# Patient Record
Sex: Female | Born: 1940 | Race: White | Hispanic: No | Marital: Married | Smoking: Never smoker
Health system: Southern US, Community
[De-identification: ages and names within clinical notes are randomized; demographics above are authoritative.]

## PROBLEM LIST (undated history)

## (undated) DIAGNOSIS — IMO0002 Reserved for concepts with insufficient information to code with codable children: Secondary | ICD-10-CM

## (undated) DIAGNOSIS — E119 Type 2 diabetes mellitus without complications: Secondary | ICD-10-CM

## (undated) DIAGNOSIS — J45909 Unspecified asthma, uncomplicated: Secondary | ICD-10-CM

## (undated) DIAGNOSIS — K219 Gastro-esophageal reflux disease without esophagitis: Secondary | ICD-10-CM

## (undated) DIAGNOSIS — I10 Essential (primary) hypertension: Secondary | ICD-10-CM

## (undated) DIAGNOSIS — IMO0001 Reserved for inherently not codable concepts without codable children: Secondary | ICD-10-CM

## (undated) DIAGNOSIS — C801 Malignant (primary) neoplasm, unspecified: Secondary | ICD-10-CM

## (undated) DIAGNOSIS — C50919 Malignant neoplasm of unspecified site of unspecified female breast: Secondary | ICD-10-CM

## (undated) HISTORY — PX: NASAL FRACTURE SURGERY: SHX718

## (undated) HISTORY — DX: Type 2 diabetes mellitus without complications: E11.9

## (undated) HISTORY — PX: BREAST LUMPECTOMY: SHX2

## (undated) HISTORY — DX: Reserved for concepts with insufficient information to code with codable children: IMO0002

## (undated) HISTORY — DX: Reserved for inherently not codable concepts without codable children: IMO0001

---

## 1981-11-14 HISTORY — PX: ABDOMINAL HYSTERECTOMY: SHX81

## 1998-08-28 ENCOUNTER — Ambulatory Visit: Admission: RE | Admit: 1998-08-28 | Discharge: 1998-08-28 | Payer: Self-pay | Admitting: Geriatric Medicine

## 1999-11-03 ENCOUNTER — Encounter: Admission: RE | Admit: 1999-11-03 | Discharge: 1999-11-03 | Payer: Self-pay | Admitting: Geriatric Medicine

## 1999-11-03 ENCOUNTER — Encounter: Payer: Self-pay | Admitting: Geriatric Medicine

## 2000-11-03 ENCOUNTER — Encounter: Admission: RE | Admit: 2000-11-03 | Discharge: 2000-11-03 | Payer: Self-pay | Admitting: Geriatric Medicine

## 2000-11-03 ENCOUNTER — Encounter: Payer: Self-pay | Admitting: Geriatric Medicine

## 2000-11-15 ENCOUNTER — Encounter: Payer: Self-pay | Admitting: Geriatric Medicine

## 2000-11-15 ENCOUNTER — Encounter: Admission: RE | Admit: 2000-11-15 | Discharge: 2000-11-15 | Payer: Self-pay | Admitting: Geriatric Medicine

## 2001-05-04 ENCOUNTER — Encounter: Admission: RE | Admit: 2001-05-04 | Discharge: 2001-05-04 | Payer: Self-pay | Admitting: Geriatric Medicine

## 2001-05-04 ENCOUNTER — Encounter: Payer: Self-pay | Admitting: Geriatric Medicine

## 2001-11-09 ENCOUNTER — Encounter: Payer: Self-pay | Admitting: Geriatric Medicine

## 2001-11-09 ENCOUNTER — Ambulatory Visit (HOSPITAL_COMMUNITY): Admission: RE | Admit: 2001-11-09 | Discharge: 2001-11-09 | Payer: Self-pay | Admitting: Geriatric Medicine

## 2002-11-01 ENCOUNTER — Encounter: Payer: Self-pay | Admitting: Geriatric Medicine

## 2002-11-01 ENCOUNTER — Ambulatory Visit (HOSPITAL_COMMUNITY): Admission: RE | Admit: 2002-11-01 | Discharge: 2002-11-01 | Payer: Self-pay | Admitting: Geriatric Medicine

## 2002-11-05 ENCOUNTER — Encounter: Admission: RE | Admit: 2002-11-05 | Discharge: 2002-11-05 | Payer: Self-pay | Admitting: Geriatric Medicine

## 2002-11-05 ENCOUNTER — Encounter (INDEPENDENT_AMBULATORY_CARE_PROVIDER_SITE_OTHER): Payer: Self-pay | Admitting: Specialist

## 2002-11-05 ENCOUNTER — Other Ambulatory Visit: Admission: RE | Admit: 2002-11-05 | Discharge: 2002-11-05 | Payer: Self-pay | Admitting: Radiology

## 2002-11-05 ENCOUNTER — Encounter: Payer: Self-pay | Admitting: Geriatric Medicine

## 2003-11-13 ENCOUNTER — Ambulatory Visit (HOSPITAL_COMMUNITY): Admission: RE | Admit: 2003-11-13 | Discharge: 2003-11-13 | Payer: Self-pay | Admitting: Geriatric Medicine

## 2004-11-18 ENCOUNTER — Emergency Department (HOSPITAL_COMMUNITY): Admission: EM | Admit: 2004-11-18 | Discharge: 2004-11-18 | Payer: Self-pay | Admitting: Emergency Medicine

## 2004-11-25 ENCOUNTER — Ambulatory Visit (HOSPITAL_COMMUNITY): Admission: RE | Admit: 2004-11-25 | Discharge: 2004-11-25 | Payer: Self-pay | Admitting: Geriatric Medicine

## 2004-12-20 ENCOUNTER — Encounter: Admission: RE | Admit: 2004-12-20 | Discharge: 2004-12-20 | Payer: Self-pay | Admitting: Geriatric Medicine

## 2004-12-23 ENCOUNTER — Encounter: Admission: RE | Admit: 2004-12-23 | Discharge: 2004-12-23 | Payer: Self-pay | Admitting: Geriatric Medicine

## 2005-12-21 ENCOUNTER — Encounter: Admission: RE | Admit: 2005-12-21 | Discharge: 2005-12-21 | Payer: Self-pay | Admitting: Geriatric Medicine

## 2006-03-15 ENCOUNTER — Encounter: Payer: Self-pay | Admitting: Geriatric Medicine

## 2006-12-25 ENCOUNTER — Ambulatory Visit (HOSPITAL_COMMUNITY): Admission: RE | Admit: 2006-12-25 | Discharge: 2006-12-25 | Payer: Self-pay | Admitting: Geriatric Medicine

## 2007-01-15 ENCOUNTER — Encounter: Admission: RE | Admit: 2007-01-15 | Discharge: 2007-01-15 | Payer: Self-pay | Admitting: Geriatric Medicine

## 2008-01-16 ENCOUNTER — Ambulatory Visit (HOSPITAL_COMMUNITY): Admission: RE | Admit: 2008-01-16 | Discharge: 2008-01-16 | Payer: Self-pay | Admitting: Geriatric Medicine

## 2009-01-20 ENCOUNTER — Encounter: Admission: RE | Admit: 2009-01-20 | Discharge: 2009-01-20 | Payer: Self-pay | Admitting: Geriatric Medicine

## 2009-04-19 ENCOUNTER — Emergency Department (HOSPITAL_COMMUNITY): Admission: EM | Admit: 2009-04-19 | Discharge: 2009-04-19 | Payer: Self-pay | Admitting: Emergency Medicine

## 2010-02-01 ENCOUNTER — Encounter: Admission: RE | Admit: 2010-02-01 | Discharge: 2010-02-01 | Payer: Self-pay | Admitting: Geriatric Medicine

## 2010-10-22 ENCOUNTER — Inpatient Hospital Stay: Admission: RE | Admit: 2010-10-22 | Payer: Self-pay | Source: Home / Self Care | Admitting: Orthopedic Surgery

## 2010-12-05 ENCOUNTER — Encounter: Payer: Self-pay | Admitting: Geriatric Medicine

## 2011-01-31 ENCOUNTER — Other Ambulatory Visit: Payer: Self-pay | Admitting: Geriatric Medicine

## 2011-01-31 DIAGNOSIS — Z1231 Encounter for screening mammogram for malignant neoplasm of breast: Secondary | ICD-10-CM

## 2011-03-21 ENCOUNTER — Ambulatory Visit
Admission: RE | Admit: 2011-03-21 | Discharge: 2011-03-21 | Disposition: A | Discharge status: Home / Self Care | Payer: Medicare Other | Source: Ambulatory Visit | Attending: Geriatric Medicine | Admitting: Geriatric Medicine

## 2011-03-21 DIAGNOSIS — Z1231 Encounter for screening mammogram for malignant neoplasm of breast: Secondary | ICD-10-CM

## 2011-03-22 ENCOUNTER — Other Ambulatory Visit: Payer: Self-pay | Admitting: Geriatric Medicine

## 2011-03-22 DIAGNOSIS — R928 Other abnormal and inconclusive findings on diagnostic imaging of breast: Secondary | ICD-10-CM

## 2011-03-29 ENCOUNTER — Ambulatory Visit
Admission: RE | Admit: 2011-03-29 | Discharge: 2011-03-29 | Disposition: A | Discharge status: Home / Self Care | Payer: Medicare Other | Source: Ambulatory Visit | Attending: Geriatric Medicine | Admitting: Geriatric Medicine

## 2011-03-29 DIAGNOSIS — R928 Other abnormal and inconclusive findings on diagnostic imaging of breast: Secondary | ICD-10-CM

## 2011-08-24 DIAGNOSIS — J449 Chronic obstructive pulmonary disease, unspecified: Secondary | ICD-10-CM | POA: Insufficient documentation

## 2012-03-12 DIAGNOSIS — R05 Cough: Secondary | ICD-10-CM | POA: Diagnosis not present

## 2012-03-12 DIAGNOSIS — Z0000 Encounter for general adult medical examination without abnormal findings: Secondary | ICD-10-CM | POA: Diagnosis not present

## 2012-03-12 DIAGNOSIS — Z79899 Other long term (current) drug therapy: Secondary | ICD-10-CM | POA: Diagnosis not present

## 2012-03-12 DIAGNOSIS — Z1331 Encounter for screening for depression: Secondary | ICD-10-CM | POA: Diagnosis not present

## 2012-03-12 DIAGNOSIS — E7800 Pure hypercholesterolemia, unspecified: Secondary | ICD-10-CM | POA: Diagnosis not present

## 2012-03-12 DIAGNOSIS — I10 Essential (primary) hypertension: Secondary | ICD-10-CM | POA: Diagnosis not present

## 2012-04-05 DIAGNOSIS — J189 Pneumonia, unspecified organism: Secondary | ICD-10-CM | POA: Diagnosis not present

## 2012-04-16 DIAGNOSIS — J189 Pneumonia, unspecified organism: Secondary | ICD-10-CM | POA: Diagnosis not present

## 2012-05-09 ENCOUNTER — Other Ambulatory Visit: Payer: Self-pay | Admitting: Geriatric Medicine

## 2012-05-09 DIAGNOSIS — Z1231 Encounter for screening mammogram for malignant neoplasm of breast: Secondary | ICD-10-CM

## 2012-05-18 ENCOUNTER — Ambulatory Visit
Admission: RE | Admit: 2012-05-18 | Discharge: 2012-05-18 | Disposition: A | Discharge status: Home / Self Care | Payer: Medicare Other | Source: Ambulatory Visit | Attending: Geriatric Medicine | Admitting: Geriatric Medicine

## 2012-05-18 ENCOUNTER — Other Ambulatory Visit: Payer: Self-pay | Admitting: Geriatric Medicine

## 2012-05-18 DIAGNOSIS — Z1231 Encounter for screening mammogram for malignant neoplasm of breast: Secondary | ICD-10-CM | POA: Diagnosis not present

## 2012-05-23 DIAGNOSIS — L821 Other seborrheic keratosis: Secondary | ICD-10-CM | POA: Diagnosis not present

## 2012-05-23 DIAGNOSIS — L538 Other specified erythematous conditions: Secondary | ICD-10-CM | POA: Diagnosis not present

## 2012-10-09 DIAGNOSIS — I10 Essential (primary) hypertension: Secondary | ICD-10-CM | POA: Diagnosis not present

## 2012-10-09 DIAGNOSIS — Z79899 Other long term (current) drug therapy: Secondary | ICD-10-CM | POA: Diagnosis not present

## 2012-10-09 DIAGNOSIS — K219 Gastro-esophageal reflux disease without esophagitis: Secondary | ICD-10-CM | POA: Diagnosis not present

## 2013-02-12 DIAGNOSIS — R05 Cough: Secondary | ICD-10-CM | POA: Diagnosis not present

## 2013-02-26 DIAGNOSIS — H2510 Age-related nuclear cataract, unspecified eye: Secondary | ICD-10-CM | POA: Diagnosis not present

## 2013-03-17 ENCOUNTER — Emergency Department (HOSPITAL_COMMUNITY): Payer: Medicare Other

## 2013-03-17 ENCOUNTER — Emergency Department (HOSPITAL_COMMUNITY)
Admission: EM | Admit: 2013-03-17 | Discharge: 2013-03-17 | Disposition: A | Discharge status: Home / Self Care | Payer: Medicare Other | Attending: Emergency Medicine | Admitting: Emergency Medicine

## 2013-03-17 ENCOUNTER — Encounter (HOSPITAL_COMMUNITY): Payer: Self-pay

## 2013-03-17 DIAGNOSIS — S7710XA Crushing injury of unspecified thigh, initial encounter: Secondary | ICD-10-CM | POA: Diagnosis not present

## 2013-03-17 DIAGNOSIS — S82209A Unspecified fracture of shaft of unspecified tibia, initial encounter for closed fracture: Secondary | ICD-10-CM | POA: Diagnosis not present

## 2013-03-17 DIAGNOSIS — I10 Essential (primary) hypertension: Secondary | ICD-10-CM | POA: Diagnosis not present

## 2013-03-17 DIAGNOSIS — Y9289 Other specified places as the place of occurrence of the external cause: Secondary | ICD-10-CM | POA: Insufficient documentation

## 2013-03-17 DIAGNOSIS — IMO0002 Reserved for concepts with insufficient information to code with codable children: Secondary | ICD-10-CM | POA: Insufficient documentation

## 2013-03-17 DIAGNOSIS — M79609 Pain in unspecified limb: Secondary | ICD-10-CM | POA: Diagnosis not present

## 2013-03-17 DIAGNOSIS — Y9389 Activity, other specified: Secondary | ICD-10-CM | POA: Insufficient documentation

## 2013-03-17 DIAGNOSIS — S82109A Unspecified fracture of upper end of unspecified tibia, initial encounter for closed fracture: Secondary | ICD-10-CM | POA: Diagnosis not present

## 2013-03-17 DIAGNOSIS — S82131A Displaced fracture of medial condyle of right tibia, initial encounter for closed fracture: Secondary | ICD-10-CM

## 2013-03-17 HISTORY — DX: Essential (primary) hypertension: I10

## 2013-03-17 MED ORDER — HYDROCODONE-ACETAMINOPHEN 5-325 MG PO TABS: 2.0000 | ORAL_TABLET | ORAL | Status: DC | PRN

## 2013-03-17 MED ORDER — IBUPROFEN 800 MG PO TABS
ORAL_TABLET | ORAL | Status: AC
  Administered 2013-03-17: 800 mg
  Filled 2013-03-17: qty 1

## 2013-03-17 NOTE — ED Provider Notes (Signed)
History     CSN: [id number]  Arrival date & time 03/17/13  1703   First MD Initiated Contact with Patient 03/17/13 1903      Chief Complaint  Patient presents with  . Knee Pain    (Consider location/radiation/quality/duration/timing/severity/associated sxs/prior treatment) HPI Comments: Patient complains of pain in her right knee after being struck by the back of a trailer. She was helping her husband back back of a trailer. She was not run over. She has pain with weightbearing on her right lateral knee. Did not hit her head or lose consciousness. Denies any chest, head, neck, abdomen or back pain. No weakness, numbness or tingling.  The history is provided by the patient.    Past Medical History  Diagnosis Date  . Hypertension     Past Surgical History  Procedure Laterality Date  . Abdominal hysterectomy      No family history on file.  History  Substance Use Topics  . Smoking status: Never Smoker   . Smokeless tobacco: Not on file  . Alcohol Use: No    OB History   Grav Para Term Preterm Abortions TAB SAB Ect Mult Living                  Review of Systems  Constitutional: Negative for fever, activity change and appetite change.  HENT: Negative for congestion and rhinorrhea.   Respiratory: Negative for cough, chest tightness and shortness of breath.   Cardiovascular: Negative for chest pain.  Gastrointestinal: Negative for nausea, vomiting and abdominal pain.  Genitourinary: Negative for dysuria.  Musculoskeletal: Positive for myalgias, arthralgias and gait problem.  Skin: Negative for rash.  Neurological: Negative for dizziness, weakness and headaches.  A complete 10 system review of systems was obtained and all systems are negative except as noted in the HPI and PMH.    Allergies  Review of patient's allergies indicates no known allergies.  Home Medications  No current outpatient prescriptions on file.  BP 176/71  Pulse 93  Temp(Src) 97.7 F (36.5  C) (Oral)  Resp 18  Ht 5\' 5"  (1.651 m)  Wt 135 lb (61.236 kg)  BMI 22.47 kg/m2  SpO2 98%  Physical Exam  Constitutional: She is oriented to person, place, and time. She appears well-developed and well-nourished. No distress.  HENT:  Head: Normocephalic and atraumatic.  Mouth/Throat: Oropharynx is clear and moist. No oropharyngeal exudate.  Eyes: Conjunctivae and EOM are normal. Pupils are equal, round, and reactive to light.  Neck: Normal range of motion. Neck supple.  Cardiovascular: Normal rate, regular rhythm and normal heart sounds.   Pulmonary/Chest: Effort normal and breath sounds normal. No respiratory distress.  Abdominal: Soft. There is no tenderness. There is no rebound and no guarding.  Musculoskeletal: Normal range of motion. She exhibits edema and tenderness.  Right knee effusion. Tender to palpation over the tibial plateau and lateral joint line. Flexion and extension intact. Intact DP and PT pulses. Compartment soft. Sensation intact. Ankle flexion and extension intact.  Neurological: She is alert and oriented to person, place, and time. No cranial nerve deficit. She exhibits normal muscle tone. Coordination normal.  Skin: Skin is warm.    ED Course  Procedures (including critical care time)  Labs Reviewed - No data to display Dg Knee Complete 4 Views Right  03/17/2013  *RADIOLOGY REPORT*  Clinical Data: Lateral right knee pain following an injury.  RIGHT KNEE - COMPLETE 4+ VIEW  Comparison: None.  Findings: Small linear lucency and minimal cortical  step off in the lateral tibial plateau on the frontal view.  No other fractures are seen.  Mild posterior patellar spur formation and mild medial tibial spine spur formation.  Small to moderate sized effusion.  IMPRESSION:  1.  Small fracture in the lateral tibial plateau with an associated small to moderate sized effusion. 2.  Minimal degenerative changes.   Original Report Authenticated By: Beckie Salts, M.D.      No  diagnosis found.    MDM  Tibia plateau fracture. Neurovascularly intact. Compartment soft.  No evidence of compartment syndrome on exam.  discussed with Dr. Hilda Lias who agrees a knee immobilizer, crutches and ice and will follow up in the office tomorrow.  Potential for compartment syndrome discussed with the family. Instructed to return for worsening pain, paresthesias, numbness, tense muscles, temperature change or color change or any other concerns.      Glynn Octave, MD 03/17/13 2132

## 2013-03-17 NOTE — ED Notes (Signed)
Pt was helping her husband back a trailer up and wa shit in the right knee by trailer, occurred this afternoon.

## 2013-03-18 DIAGNOSIS — Z1331 Encounter for screening for depression: Secondary | ICD-10-CM | POA: Diagnosis not present

## 2013-03-18 DIAGNOSIS — Z0000 Encounter for general adult medical examination without abnormal findings: Secondary | ICD-10-CM | POA: Diagnosis not present

## 2013-03-18 DIAGNOSIS — M810 Age-related osteoporosis without current pathological fracture: Secondary | ICD-10-CM | POA: Diagnosis not present

## 2013-03-18 DIAGNOSIS — E7800 Pure hypercholesterolemia, unspecified: Secondary | ICD-10-CM | POA: Diagnosis not present

## 2013-03-18 DIAGNOSIS — I10 Essential (primary) hypertension: Secondary | ICD-10-CM | POA: Diagnosis not present

## 2013-03-18 DIAGNOSIS — K219 Gastro-esophageal reflux disease without esophagitis: Secondary | ICD-10-CM | POA: Diagnosis not present

## 2013-03-18 DIAGNOSIS — Z79899 Other long term (current) drug therapy: Secondary | ICD-10-CM | POA: Diagnosis not present

## 2013-03-19 DIAGNOSIS — S82109A Unspecified fracture of upper end of unspecified tibia, initial encounter for closed fracture: Secondary | ICD-10-CM | POA: Diagnosis not present

## 2013-03-19 DIAGNOSIS — I10 Essential (primary) hypertension: Secondary | ICD-10-CM | POA: Diagnosis not present

## 2013-03-26 DIAGNOSIS — S82109A Unspecified fracture of upper end of unspecified tibia, initial encounter for closed fracture: Secondary | ICD-10-CM | POA: Diagnosis not present

## 2013-03-26 DIAGNOSIS — I10 Essential (primary) hypertension: Secondary | ICD-10-CM | POA: Diagnosis not present

## 2013-04-15 DIAGNOSIS — Z79899 Other long term (current) drug therapy: Secondary | ICD-10-CM | POA: Diagnosis not present

## 2013-04-18 DIAGNOSIS — I10 Essential (primary) hypertension: Secondary | ICD-10-CM | POA: Diagnosis not present

## 2013-04-18 DIAGNOSIS — S82109A Unspecified fracture of upper end of unspecified tibia, initial encounter for closed fracture: Secondary | ICD-10-CM | POA: Diagnosis not present

## 2013-04-25 DIAGNOSIS — R05 Cough: Secondary | ICD-10-CM | POA: Diagnosis not present

## 2013-04-25 DIAGNOSIS — J209 Acute bronchitis, unspecified: Secondary | ICD-10-CM | POA: Diagnosis not present

## 2013-04-25 DIAGNOSIS — I10 Essential (primary) hypertension: Secondary | ICD-10-CM | POA: Diagnosis not present

## 2013-05-02 DIAGNOSIS — I10 Essential (primary) hypertension: Secondary | ICD-10-CM | POA: Diagnosis not present

## 2013-05-02 DIAGNOSIS — S82109A Unspecified fracture of upper end of unspecified tibia, initial encounter for closed fracture: Secondary | ICD-10-CM | POA: Diagnosis not present

## 2013-06-07 DIAGNOSIS — J45909 Unspecified asthma, uncomplicated: Secondary | ICD-10-CM | POA: Diagnosis not present

## 2013-06-07 DIAGNOSIS — J40 Bronchitis, not specified as acute or chronic: Secondary | ICD-10-CM | POA: Diagnosis not present

## 2013-06-07 DIAGNOSIS — R05 Cough: Secondary | ICD-10-CM | POA: Diagnosis not present

## 2013-06-14 ENCOUNTER — Other Ambulatory Visit (HOSPITAL_COMMUNITY): Payer: Self-pay

## 2013-06-14 DIAGNOSIS — J45909 Unspecified asthma, uncomplicated: Secondary | ICD-10-CM | POA: Diagnosis not present

## 2013-06-14 DIAGNOSIS — J40 Bronchitis, not specified as acute or chronic: Secondary | ICD-10-CM | POA: Diagnosis not present

## 2013-06-18 DIAGNOSIS — Z79899 Other long term (current) drug therapy: Secondary | ICD-10-CM | POA: Diagnosis not present

## 2013-06-18 DIAGNOSIS — E7800 Pure hypercholesterolemia, unspecified: Secondary | ICD-10-CM | POA: Diagnosis not present

## 2013-06-21 ENCOUNTER — Ambulatory Visit (HOSPITAL_COMMUNITY)
Admission: RE | Admit: 2013-06-21 | Discharge: 2013-06-21 | Disposition: A | Discharge status: Home / Self Care | Payer: Medicare Other | Source: Ambulatory Visit | Attending: Geriatric Medicine | Admitting: Geriatric Medicine

## 2013-06-21 DIAGNOSIS — J45909 Unspecified asthma, uncomplicated: Secondary | ICD-10-CM | POA: Insufficient documentation

## 2013-06-21 MED ORDER — ALBUTEROL SULFATE (5 MG/ML) 0.5% IN NEBU
2.5000 mg | INHALATION_SOLUTION | Freq: Once | RESPIRATORY_TRACT | Status: AC
  Administered 2013-06-21: 2.5 mg via RESPIRATORY_TRACT

## 2013-07-03 ENCOUNTER — Other Ambulatory Visit: Payer: Self-pay

## 2013-07-03 DIAGNOSIS — Z1231 Encounter for screening mammogram for malignant neoplasm of breast: Secondary | ICD-10-CM

## 2013-07-12 DIAGNOSIS — R05 Cough: Secondary | ICD-10-CM | POA: Diagnosis not present

## 2013-07-12 DIAGNOSIS — I10 Essential (primary) hypertension: Secondary | ICD-10-CM | POA: Diagnosis not present

## 2013-07-12 DIAGNOSIS — R7301 Impaired fasting glucose: Secondary | ICD-10-CM | POA: Diagnosis not present

## 2013-07-12 DIAGNOSIS — E7800 Pure hypercholesterolemia, unspecified: Secondary | ICD-10-CM | POA: Diagnosis not present

## 2013-07-12 DIAGNOSIS — D72829 Elevated white blood cell count, unspecified: Secondary | ICD-10-CM | POA: Diagnosis not present

## 2013-07-12 DIAGNOSIS — Z79899 Other long term (current) drug therapy: Secondary | ICD-10-CM | POA: Diagnosis not present

## 2013-07-23 ENCOUNTER — Ambulatory Visit
Admission: RE | Admit: 2013-07-23 | Discharge: 2013-07-23 | Disposition: A | Discharge status: Home / Self Care | Payer: Medicare Other | Source: Ambulatory Visit

## 2013-07-23 DIAGNOSIS — Z1231 Encounter for screening mammogram for malignant neoplasm of breast: Secondary | ICD-10-CM

## 2013-07-23 DIAGNOSIS — Z79899 Other long term (current) drug therapy: Secondary | ICD-10-CM | POA: Diagnosis not present

## 2013-07-23 DIAGNOSIS — R05 Cough: Secondary | ICD-10-CM | POA: Diagnosis not present

## 2013-07-23 DIAGNOSIS — R7989 Other specified abnormal findings of blood chemistry: Secondary | ICD-10-CM | POA: Diagnosis not present

## 2013-07-29 DIAGNOSIS — R05 Cough: Secondary | ICD-10-CM | POA: Diagnosis not present

## 2013-07-29 DIAGNOSIS — J45909 Unspecified asthma, uncomplicated: Secondary | ICD-10-CM | POA: Diagnosis not present

## 2013-08-12 DIAGNOSIS — M810 Age-related osteoporosis without current pathological fracture: Secondary | ICD-10-CM | POA: Diagnosis not present

## 2013-08-12 DIAGNOSIS — E1139 Type 2 diabetes mellitus with other diabetic ophthalmic complication: Secondary | ICD-10-CM | POA: Diagnosis not present

## 2013-08-16 DIAGNOSIS — I10 Essential (primary) hypertension: Secondary | ICD-10-CM | POA: Diagnosis not present

## 2013-08-26 DIAGNOSIS — R7301 Impaired fasting glucose: Secondary | ICD-10-CM | POA: Diagnosis not present

## 2013-09-10 ENCOUNTER — Encounter: Payer: Medicare Other | Attending: Geriatric Medicine | Admitting: *Deleted

## 2013-09-10 ENCOUNTER — Encounter: Payer: Self-pay | Admitting: *Deleted

## 2013-09-10 VITALS — Ht 59.5 in | Wt 120.7 lb

## 2013-09-10 DIAGNOSIS — E119 Type 2 diabetes mellitus without complications: Secondary | ICD-10-CM | POA: Diagnosis not present

## 2013-09-10 DIAGNOSIS — Z713 Dietary counseling and surveillance: Secondary | ICD-10-CM | POA: Diagnosis not present

## 2013-09-10 NOTE — Progress Notes (Deleted)
Subjective:     Patient ID: Lori Greene, female   DOB: 1941/08/11, 72 y.o.   MRN: [id number]  HPI   Review of Systems     Objective:   Physical Exam     Assessment:     ***    Plan:     ***

## 2013-09-10 NOTE — Progress Notes (Signed)
Appt start time: 1000 end time:  1130.  Assessment:  Patient was seen on  09/10/13 for individual diabetes education. Patient present with new diagnosis of T2DM with A1c of 11.4%. Her glucose had been increasing gradually over the years. She has lost 7# since her 07/25/13 visit with Dr. Pete Glatter.  Current HbA1c: 11.4%  Preferred Learning Style:  Hands on  Learning Readiness:   Ready  Change in progress  MEDICATIONS: See List: Metformin 500mg  BID  DIETARY INTAKE:  Usual eating pattern includes 3 meals and 0 snacks per day.  Everyday foods include wide array of food groups. Not as much dairy as might be encouragede.     24-hr recall:  B ( AM): 1 scrambled eggs, 1/2 portion hash brown, 1 1/2 strips bacon, 1 slice raisin bread, water Snk ( AM): none  L ( PM): none Snk ( PM): none D ( PM): beef roast, potatoes, sweet tea (sugar) Snk ( PM): kettle cook potato chips, water Beverages: water, sprite (regular) only a few sips, sweet tea  Usual physical activity: Walking tapes [place])    Intervention:  Nutrition counseling provided.  Discussed diabetes disease process and treatment options.  Discussed physiology of diabetes and role of obesity on insulin resistance.  Encouraged moderate weight reduction to improve glucose levels.  Discussed role of medications and diet in glucose control  Provided education on macronutrients on glucose levels.  Provided education on carb counting, importance of regularly scheduled meals/snacks, and meal planning  Discussed effects of physical activity on glucose levels and long-term glucose control.  Recommended 150 minutes of physical activity/week.  Reviewed patient medications.  Discussed role of medication on blood glucose and possible side effects  Discussed blood glucose monitoring and interpretation.  Discussed recommended target ranges and individual ranges.    Described short-term complications: hyper- and hypo-glycemia.   Discussed causes,symptoms, and treatment options.  Discussed prevention, detection, and treatment of long-term complications.  Discussed the role of prolonged elevated glucose levels on body systems.  Discussed role of stress on blood glucose levels and discussed strategies to manage psychosocial issues.  Discussed recommendations for long-term diabetes self-care.  Established checklist for medical, dental, and emotional self-care.  Plan:  Aim for 2-3 Carb Choices per meal (30-45 grams) +/- 1 either way  Aim for 0-1 Carbs per snack if hungry  Consider reading food labels for Total Carbohydrate and Fat Grams of foods Consider  increasing your activity level by doing your walking tapes for 30 minutes 3 days daily as tolerated Consider checking BG at alternate times per day as directed by MD  take medication as directed by MD Try drinking Diet Sprite vrs regular Make ice Tea 1/2 sugar 1/2 Splenda  Accu Chek Nano BG Monitoring Kit [id number] Exp: 10/13/14 2 1/2hpp 126  Demonstrated degree of understanding via:  Teach Back  Teaching Method Utilized:  Visual Auditory Hands on  Handouts given during visit include: Living Well with Diabetes Carb Counting and Food Label handouts Meal Plan Card Plate method  Snack sheet  Barriers to learning/adherence to lifestyle change: fatigue  Diabetes self-care support plan:   [facility] support group  Husband     Monitoring/Evaluation:  Dietary intake, exercise, monitor glucose, and body weight return prn.

## 2013-09-10 NOTE — Patient Instructions (Signed)
Plan:  Aim for 2-3 Carb Choices per meal (30-45 grams) +/- 1 either way  Aim for 0-1 Carbs per snack if hungry  Consider reading food labels for Total Carbohydrate and Fat Grams of foods Consider  increasing your activity level by doing your walking tapes for 30 minutes 3 days daily as tolerated Consider checking BG at alternate times per day as directed by MD  Consider taking medication as directed by MD

## 2013-09-18 DIAGNOSIS — I10 Essential (primary) hypertension: Secondary | ICD-10-CM | POA: Diagnosis not present

## 2013-09-18 DIAGNOSIS — R05 Cough: Secondary | ICD-10-CM | POA: Diagnosis not present

## 2014-01-01 DIAGNOSIS — J45909 Unspecified asthma, uncomplicated: Secondary | ICD-10-CM | POA: Diagnosis not present

## 2014-01-13 DIAGNOSIS — J329 Chronic sinusitis, unspecified: Secondary | ICD-10-CM | POA: Diagnosis not present

## 2014-01-13 DIAGNOSIS — J40 Bronchitis, not specified as acute or chronic: Secondary | ICD-10-CM | POA: Diagnosis not present

## 2014-01-22 DIAGNOSIS — Z79899 Other long term (current) drug therapy: Secondary | ICD-10-CM | POA: Diagnosis not present

## 2014-01-22 DIAGNOSIS — R5381 Other malaise: Secondary | ICD-10-CM | POA: Diagnosis not present

## 2014-01-22 DIAGNOSIS — E7800 Pure hypercholesterolemia, unspecified: Secondary | ICD-10-CM | POA: Diagnosis not present

## 2014-01-22 DIAGNOSIS — I10 Essential (primary) hypertension: Secondary | ICD-10-CM | POA: Diagnosis not present

## 2014-01-22 DIAGNOSIS — IMO0001 Reserved for inherently not codable concepts without codable children: Secondary | ICD-10-CM | POA: Diagnosis not present

## 2014-01-22 DIAGNOSIS — R5383 Other fatigue: Secondary | ICD-10-CM | POA: Diagnosis not present

## 2014-02-03 DIAGNOSIS — J309 Allergic rhinitis, unspecified: Secondary | ICD-10-CM | POA: Diagnosis not present

## 2014-02-03 DIAGNOSIS — K219 Gastro-esophageal reflux disease without esophagitis: Secondary | ICD-10-CM | POA: Diagnosis not present

## 2014-02-03 DIAGNOSIS — J45909 Unspecified asthma, uncomplicated: Secondary | ICD-10-CM | POA: Diagnosis not present

## 2014-02-12 DIAGNOSIS — E7800 Pure hypercholesterolemia, unspecified: Secondary | ICD-10-CM | POA: Diagnosis not present

## 2014-02-12 DIAGNOSIS — Z79899 Other long term (current) drug therapy: Secondary | ICD-10-CM | POA: Diagnosis not present

## 2014-03-06 DIAGNOSIS — E1139 Type 2 diabetes mellitus with other diabetic ophthalmic complication: Secondary | ICD-10-CM | POA: Diagnosis not present

## 2014-03-06 DIAGNOSIS — E1165 Type 2 diabetes mellitus with hyperglycemia: Secondary | ICD-10-CM | POA: Diagnosis not present

## 2014-07-23 DIAGNOSIS — Z79899 Other long term (current) drug therapy: Secondary | ICD-10-CM | POA: Diagnosis not present

## 2014-07-23 DIAGNOSIS — E7800 Pure hypercholesterolemia, unspecified: Secondary | ICD-10-CM | POA: Diagnosis not present

## 2014-07-23 DIAGNOSIS — I129 Hypertensive chronic kidney disease with stage 1 through stage 4 chronic kidney disease, or unspecified chronic kidney disease: Secondary | ICD-10-CM | POA: Diagnosis not present

## 2014-07-23 DIAGNOSIS — Z1331 Encounter for screening for depression: Secondary | ICD-10-CM | POA: Diagnosis not present

## 2014-07-23 DIAGNOSIS — E1129 Type 2 diabetes mellitus with other diabetic kidney complication: Secondary | ICD-10-CM | POA: Diagnosis not present

## 2014-07-23 DIAGNOSIS — N183 Chronic kidney disease, stage 3 unspecified: Secondary | ICD-10-CM | POA: Diagnosis not present

## 2014-07-23 DIAGNOSIS — Z0000 Encounter for general adult medical examination without abnormal findings: Secondary | ICD-10-CM | POA: Diagnosis not present

## 2014-07-23 DIAGNOSIS — Z23 Encounter for immunization: Secondary | ICD-10-CM | POA: Diagnosis not present

## 2014-07-23 DIAGNOSIS — M810 Age-related osteoporosis without current pathological fracture: Secondary | ICD-10-CM | POA: Diagnosis not present

## 2014-08-13 DIAGNOSIS — I10 Essential (primary) hypertension: Secondary | ICD-10-CM | POA: Diagnosis not present

## 2014-08-13 DIAGNOSIS — Z79899 Other long term (current) drug therapy: Secondary | ICD-10-CM | POA: Diagnosis not present

## 2014-08-13 DIAGNOSIS — E119 Type 2 diabetes mellitus without complications: Secondary | ICD-10-CM | POA: Diagnosis not present

## 2014-08-19 ENCOUNTER — Other Ambulatory Visit: Payer: Self-pay

## 2014-08-19 DIAGNOSIS — Z1231 Encounter for screening mammogram for malignant neoplasm of breast: Secondary | ICD-10-CM

## 2014-08-29 ENCOUNTER — Ambulatory Visit
Admission: RE | Admit: 2014-08-29 | Discharge: 2014-08-29 | Disposition: A | Discharge status: Home / Self Care | Payer: Medicare Other | Source: Ambulatory Visit

## 2014-08-29 DIAGNOSIS — Z1231 Encounter for screening mammogram for malignant neoplasm of breast: Secondary | ICD-10-CM | POA: Diagnosis not present

## 2014-09-18 ENCOUNTER — Other Ambulatory Visit (HOSPITAL_COMMUNITY): Payer: Self-pay | Admitting: Internal Medicine

## 2014-09-18 ENCOUNTER — Ambulatory Visit (HOSPITAL_COMMUNITY)
Admission: RE | Admit: 2014-09-18 | Discharge: 2014-09-18 | Disposition: A | Discharge status: Home / Self Care | Payer: Medicare Other | Source: Ambulatory Visit | Attending: Internal Medicine | Admitting: Internal Medicine

## 2014-09-18 DIAGNOSIS — K8080 Other cholelithiasis without obstruction: Secondary | ICD-10-CM | POA: Insufficient documentation

## 2014-09-18 DIAGNOSIS — K8020 Calculus of gallbladder without cholecystitis without obstruction: Secondary | ICD-10-CM | POA: Diagnosis not present

## 2014-09-18 DIAGNOSIS — R1030 Lower abdominal pain, unspecified: Secondary | ICD-10-CM

## 2014-09-18 DIAGNOSIS — R109 Unspecified abdominal pain: Secondary | ICD-10-CM | POA: Diagnosis not present

## 2014-09-22 DIAGNOSIS — B029 Zoster without complications: Secondary | ICD-10-CM | POA: Diagnosis not present

## 2014-10-01 DIAGNOSIS — K219 Gastro-esophageal reflux disease without esophagitis: Secondary | ICD-10-CM | POA: Diagnosis not present

## 2014-10-01 DIAGNOSIS — J300 Vasomotor rhinitis: Secondary | ICD-10-CM | POA: Diagnosis not present

## 2014-10-01 DIAGNOSIS — J4530 Mild persistent asthma, uncomplicated: Secondary | ICD-10-CM | POA: Diagnosis not present

## 2014-11-14 DIAGNOSIS — C801 Malignant (primary) neoplasm, unspecified: Secondary | ICD-10-CM

## 2014-11-14 HISTORY — DX: Malignant (primary) neoplasm, unspecified: C80.1

## 2014-12-19 DIAGNOSIS — J45909 Unspecified asthma, uncomplicated: Secondary | ICD-10-CM | POA: Diagnosis not present

## 2015-01-23 DIAGNOSIS — S99911A Unspecified injury of right ankle, initial encounter: Secondary | ICD-10-CM | POA: Diagnosis not present

## 2015-01-23 DIAGNOSIS — S93401A Sprain of unspecified ligament of right ankle, initial encounter: Secondary | ICD-10-CM | POA: Diagnosis not present

## 2015-01-23 DIAGNOSIS — M25471 Effusion, right ankle: Secondary | ICD-10-CM | POA: Diagnosis not present

## 2015-01-23 DIAGNOSIS — M25571 Pain in right ankle and joints of right foot: Secondary | ICD-10-CM | POA: Diagnosis not present

## 2015-02-05 DIAGNOSIS — J300 Vasomotor rhinitis: Secondary | ICD-10-CM | POA: Diagnosis not present

## 2015-02-05 DIAGNOSIS — K219 Gastro-esophageal reflux disease without esophagitis: Secondary | ICD-10-CM | POA: Diagnosis not present

## 2015-02-05 DIAGNOSIS — J209 Acute bronchitis, unspecified: Secondary | ICD-10-CM | POA: Diagnosis not present

## 2015-02-05 DIAGNOSIS — J4530 Mild persistent asthma, uncomplicated: Secondary | ICD-10-CM | POA: Diagnosis not present

## 2015-02-11 DIAGNOSIS — N183 Chronic kidney disease, stage 3 (moderate): Secondary | ICD-10-CM | POA: Diagnosis not present

## 2015-02-11 DIAGNOSIS — J45909 Unspecified asthma, uncomplicated: Secondary | ICD-10-CM | POA: Diagnosis not present

## 2015-02-11 DIAGNOSIS — I129 Hypertensive chronic kidney disease with stage 1 through stage 4 chronic kidney disease, or unspecified chronic kidney disease: Secondary | ICD-10-CM | POA: Diagnosis not present

## 2015-02-11 DIAGNOSIS — E780 Pure hypercholesterolemia: Secondary | ICD-10-CM | POA: Diagnosis not present

## 2015-02-11 DIAGNOSIS — Z79899 Other long term (current) drug therapy: Secondary | ICD-10-CM | POA: Diagnosis not present

## 2015-02-11 DIAGNOSIS — E1121 Type 2 diabetes mellitus with diabetic nephropathy: Secondary | ICD-10-CM | POA: Diagnosis not present

## 2015-02-11 DIAGNOSIS — E119 Type 2 diabetes mellitus without complications: Secondary | ICD-10-CM | POA: Diagnosis not present

## 2015-02-11 DIAGNOSIS — I10 Essential (primary) hypertension: Secondary | ICD-10-CM | POA: Diagnosis not present

## 2015-04-28 DIAGNOSIS — J300 Vasomotor rhinitis: Secondary | ICD-10-CM | POA: Diagnosis not present

## 2015-04-28 DIAGNOSIS — K219 Gastro-esophageal reflux disease without esophagitis: Secondary | ICD-10-CM | POA: Diagnosis not present

## 2015-04-28 DIAGNOSIS — J4531 Mild persistent asthma with (acute) exacerbation: Secondary | ICD-10-CM | POA: Diagnosis not present

## 2015-05-14 DIAGNOSIS — H43813 Vitreous degeneration, bilateral: Secondary | ICD-10-CM | POA: Diagnosis not present

## 2015-05-14 DIAGNOSIS — E119 Type 2 diabetes mellitus without complications: Secondary | ICD-10-CM | POA: Diagnosis not present

## 2015-05-14 DIAGNOSIS — H2513 Age-related nuclear cataract, bilateral: Secondary | ICD-10-CM | POA: Diagnosis not present

## 2015-05-14 DIAGNOSIS — H1859 Other hereditary corneal dystrophies: Secondary | ICD-10-CM | POA: Diagnosis not present

## 2015-06-26 DIAGNOSIS — K219 Gastro-esophageal reflux disease without esophagitis: Secondary | ICD-10-CM | POA: Diagnosis not present

## 2015-06-26 DIAGNOSIS — R05 Cough: Secondary | ICD-10-CM | POA: Diagnosis not present

## 2015-06-26 DIAGNOSIS — J45909 Unspecified asthma, uncomplicated: Secondary | ICD-10-CM | POA: Diagnosis not present

## 2015-07-28 DIAGNOSIS — Z0000 Encounter for general adult medical examination without abnormal findings: Secondary | ICD-10-CM | POA: Diagnosis not present

## 2015-07-28 DIAGNOSIS — Z1389 Encounter for screening for other disorder: Secondary | ICD-10-CM | POA: Diagnosis not present

## 2015-07-28 DIAGNOSIS — I129 Hypertensive chronic kidney disease with stage 1 through stage 4 chronic kidney disease, or unspecified chronic kidney disease: Secondary | ICD-10-CM | POA: Diagnosis not present

## 2015-07-28 DIAGNOSIS — L84 Corns and callosities: Secondary | ICD-10-CM | POA: Diagnosis not present

## 2015-07-28 DIAGNOSIS — Z7189 Other specified counseling: Secondary | ICD-10-CM | POA: Diagnosis not present

## 2015-07-28 DIAGNOSIS — N183 Chronic kidney disease, stage 3 (moderate): Secondary | ICD-10-CM | POA: Diagnosis not present

## 2015-07-28 DIAGNOSIS — Z79899 Other long term (current) drug therapy: Secondary | ICD-10-CM | POA: Diagnosis not present

## 2015-07-28 DIAGNOSIS — E780 Pure hypercholesterolemia: Secondary | ICD-10-CM | POA: Diagnosis not present

## 2015-07-28 DIAGNOSIS — M810 Age-related osteoporosis without current pathological fracture: Secondary | ICD-10-CM | POA: Diagnosis not present

## 2015-07-28 DIAGNOSIS — K219 Gastro-esophageal reflux disease without esophagitis: Secondary | ICD-10-CM | POA: Diagnosis not present

## 2015-07-28 DIAGNOSIS — J45909 Unspecified asthma, uncomplicated: Secondary | ICD-10-CM | POA: Diagnosis not present

## 2015-07-28 DIAGNOSIS — E1121 Type 2 diabetes mellitus with diabetic nephropathy: Secondary | ICD-10-CM | POA: Diagnosis not present

## 2015-08-24 ENCOUNTER — Ambulatory Visit
Admission: RE | Admit: 2015-08-24 | Discharge: 2015-08-24 | Disposition: A | Discharge status: Home / Self Care | Payer: Medicare Other | Source: Ambulatory Visit | Attending: Allergy and Immunology | Admitting: Allergy and Immunology

## 2015-08-24 ENCOUNTER — Other Ambulatory Visit: Payer: Self-pay | Admitting: Allergy and Immunology

## 2015-08-24 DIAGNOSIS — J4540 Moderate persistent asthma, uncomplicated: Secondary | ICD-10-CM | POA: Diagnosis not present

## 2015-08-24 DIAGNOSIS — J209 Acute bronchitis, unspecified: Secondary | ICD-10-CM | POA: Diagnosis not present

## 2015-08-24 DIAGNOSIS — K219 Gastro-esophageal reflux disease without esophagitis: Secondary | ICD-10-CM | POA: Diagnosis not present

## 2015-08-24 DIAGNOSIS — J300 Vasomotor rhinitis: Secondary | ICD-10-CM | POA: Diagnosis not present

## 2015-08-24 DIAGNOSIS — J40 Bronchitis, not specified as acute or chronic: Secondary | ICD-10-CM | POA: Diagnosis not present

## 2015-08-27 ENCOUNTER — Other Ambulatory Visit: Payer: Self-pay

## 2015-08-27 DIAGNOSIS — Z1231 Encounter for screening mammogram for malignant neoplasm of breast: Secondary | ICD-10-CM

## 2015-08-28 ENCOUNTER — Ambulatory Visit
Admission: RE | Admit: 2015-08-28 | Discharge: 2015-08-28 | Disposition: A | Discharge status: Home / Self Care | Payer: Medicare Other | Source: Ambulatory Visit | Attending: Geriatric Medicine | Admitting: Geriatric Medicine

## 2015-08-28 ENCOUNTER — Other Ambulatory Visit: Payer: Self-pay | Admitting: Geriatric Medicine

## 2015-08-28 DIAGNOSIS — R05 Cough: Secondary | ICD-10-CM | POA: Diagnosis not present

## 2015-08-28 DIAGNOSIS — J9811 Atelectasis: Secondary | ICD-10-CM

## 2015-08-28 DIAGNOSIS — J45909 Unspecified asthma, uncomplicated: Secondary | ICD-10-CM | POA: Diagnosis not present

## 2015-08-28 DIAGNOSIS — E1121 Type 2 diabetes mellitus with diabetic nephropathy: Secondary | ICD-10-CM | POA: Diagnosis not present

## 2015-08-28 DIAGNOSIS — Z23 Encounter for immunization: Secondary | ICD-10-CM | POA: Diagnosis not present

## 2015-09-04 ENCOUNTER — Ambulatory Visit
Admission: RE | Admit: 2015-09-04 | Discharge: 2015-09-04 | Disposition: A | Discharge status: Home / Self Care | Payer: Medicare Other | Source: Ambulatory Visit

## 2015-09-04 DIAGNOSIS — Z1231 Encounter for screening mammogram for malignant neoplasm of breast: Secondary | ICD-10-CM

## 2015-09-09 ENCOUNTER — Other Ambulatory Visit: Payer: Self-pay | Admitting: Geriatric Medicine

## 2015-09-09 DIAGNOSIS — R928 Other abnormal and inconclusive findings on diagnostic imaging of breast: Secondary | ICD-10-CM

## 2015-09-10 ENCOUNTER — Encounter: Payer: Self-pay | Admitting: Internal Medicine

## 2015-09-10 ENCOUNTER — Ambulatory Visit (INDEPENDENT_AMBULATORY_CARE_PROVIDER_SITE_OTHER): Payer: Medicare Other | Admitting: Internal Medicine

## 2015-09-10 VITALS — BP 138/80 | HR 72 | Ht 59.5 in | Wt 120.2 lb

## 2015-09-10 DIAGNOSIS — K449 Diaphragmatic hernia without obstruction or gangrene: Secondary | ICD-10-CM

## 2015-09-10 DIAGNOSIS — R0989 Other specified symptoms and signs involving the circulatory and respiratory systems: Secondary | ICD-10-CM

## 2015-09-10 DIAGNOSIS — R05 Cough: Secondary | ICD-10-CM | POA: Diagnosis not present

## 2015-09-10 DIAGNOSIS — R059 Cough, unspecified: Secondary | ICD-10-CM

## 2015-09-10 NOTE — Progress Notes (Signed)
Subjective:    Patient ID: Lori Greene, female    DOB: [birth year], 74 y.o.   MRN: [id number]  HPI  PCP Mathews Argyle, MD Referred byu Dr Remus Blake   OV 09/10/2015  Chief Complaint  Patient presents with  . Pulmonary Consult    Pt referred by Dr. Orvil Feil for wheezing, chest congestion, prod cough with white mucus intermittently x 12 months. Pt states when she has the congestion and cough she is also SOB, pt states these exacerbations occur every few weeks.    74 year old female referred by Dr. Remus Blake for episodes of cough. History is obtained from the patient and from review of Dr. Lilli Few note. She reports a few to several year history of episodes of chronic cough associated with white sputum characterized by interval periods of baseline persistent chronic cough associated with the same white sputum. At baseline cough is rated as mild associated with mild white sputum. Episodically maybe 1 or 2 times a year she would get worsening exacerbations characterized by symptoms getting to moderate to severe category. Typically these in the past been treated by primary care physician with a single cortisone shot or antibiotic therapy. However in 2016 she started noticing worsening intervals of exacerbation. Just this year alone she's had maybe 3 exacerbations. She was referred to Dr. Remus Blake a year ago. Was started on Symbicort according to her history but apparently this has not helped. She has used the Symbicort with a spacer according to referral notes. Also reports that between episodes she is not clearing up. She gives a history of negative allergy skin testing at initial intake with the allergist [I confirmed this on review of the note]. Most recent flareup was earlier in October 2016 a few weeks ago. She was given 5 days of prednisone and a Z-Pak course and this has helped clear up her symptoms. Currently she is asymptomatic. Off note when she had this acute visit with allergy in 08/24/2015  prior to initiation of prednisone and antibiotics her exhaled nitric oxide was slightly elevated at 38 ppb. Pulse ox was reported as 96% on room air at rest. Overall she does not report any dyspnea with exertion. She did have a chest x-rayChest x-ray 08/28/2015: Personally visualized and very similar to May 2014. The some bilateral bibasal atelectasis with a small hiatal hernia. This is consistent with the official report  Other cough relevant history - Off note she is not aware of a hiatal hernia diagnosis but she does admit to significant acid reflux disease for which she is on Prilosec. It is noted that she is on fish oil  - Sinus drainage and allergies: Allergy test is negative  - Pulmonary history: Denies any asthma pulmonary fibrosis diagnosis but chest x-ray does show bibasal atelectasis. Symbicort is not helping. As stated above exhaled nitric oxide was slightly elevated in October 2016. Walking desaturation test 185 feet 3 laps on room air did not desaturate    has a past medical history of Hypertension and Diabetes mellitus without complication ([place]).   reports that she has never smoked. She has never used smokeless tobacco.  Past Surgical History  Procedure Laterality Date  . Abdominal hysterectomy  1983    Allergies  Allergen Reactions  . Penicillins Rash    Immunization History  Administered Date(s) Administered  . Influenza,inj,Quad PF,36+ Mos 08/28/2015  . Pneumococcal Conjugate-13 08/14/2014    Family History  Problem Relation Age of Onset  . Breast cancer Mother   . Leukemia Father  Current outpatient prescriptions:  .  albuterol (PROAIR HFA) 108 (90 BASE) MCG/ACT inhaler, Inhale 2 puffs into the lungs every 6 (six) hours as needed for wheezing or shortness of breath., Disp: , Rfl:  .  atorvastatin (LIPITOR) 10 MG tablet, Take 10 mg by mouth daily., Disp: , Rfl:  .  budesonide-formoterol (SYMBICORT) 80-4.5 MCG/ACT inhaler, Inhale 2 puffs into the lungs 2  (two) times daily., Disp: , Rfl:  .  Calcium Carbonate-Vitamin D (CALCIUM + D PO), Take 1 tablet by mouth every morning., Disp: , Rfl:  .  cholecalciferol (VITAMIN D) 1000 UNITS tablet, Take 1,000 Units by mouth every morning., Disp: , Rfl:  .  fish oil-omega-3 fatty acids 1000 MG capsule, Take 1 g by mouth every morning., Disp: , Rfl:  .  lisinopril (PRINIVIL,ZESTRIL) 5 MG tablet, Take 5 mg by mouth daily., Disp: , Rfl:  .  metFORMIN (GLUCOPHAGE) 500 MG tablet, Take 500 mg by mouth daily with breakfast. , Disp: , Rfl:  .  Multiple Vitamin (MULTIVITAMIN WITH MINERALS) TABS, Take 1 tablet by mouth every morning., Disp: , Rfl:  .  omeprazole (PRILOSEC) 40 MG capsule, Take 40 mg by mouth daily., Disp: , Rfl:  .  triamterene-hydrochlorothiazide (DYAZIDE) 37.5-25 MG per capsule, Take 1 capsule by mouth every morning., Disp: , Rfl:      Review of Systems  Constitutional: Negative for fever and unexpected weight change.  HENT: Negative for congestion, dental problem, ear pain, nosebleeds, postnasal drip, rhinorrhea, sinus pressure, sneezing, sore throat and trouble swallowing.   Eyes: Negative for redness and itching.  Respiratory: Positive for cough. Negative for chest tightness, shortness of breath and wheezing.   Cardiovascular: Negative for palpitations and leg swelling.  Gastrointestinal: Negative for nausea and vomiting.  Genitourinary: Negative for dysuria.  Musculoskeletal: Negative for joint swelling.  Skin: Negative for rash.  Neurological: Negative for headaches.  Hematological: Does not bruise/bleed easily.  Psychiatric/Behavioral: Negative for dysphoric mood. The patient is not nervous/anxious.        Objective:   Physical Exam  Constitutional: She is oriented to person, place, and time. She appears well-developed and well-nourished. No distress.  Body mass index is 23.88 kg/(m^2).   HENT:  Head: Normocephalic and atraumatic.  Right Ear: External ear normal.  Left Ear:  External ear normal.  Mouth/Throat: Oropharynx is clear and moist. No oropharyngeal exudate.  Eyes: Conjunctivae and EOM are normal. Pupils are equal, round, and reactive to light. Right eye exhibits no discharge. Left eye exhibits no discharge. No scleral icterus.  Neck: Normal range of motion. Neck supple. No JVD present. No tracheal deviation present. No thyromegaly present.  Cardiovascular: Normal rate, regular rhythm, normal heart sounds and intact distal pulses.  Exam reveals no gallop and no friction rub.   No murmur heard. Pulmonary/Chest: Effort normal. No respiratory distress. She has no wheezes. She has rales. She exhibits no tenderness.  Crackles present in the left base  Abdominal: Soft. Bowel sounds are normal. She exhibits no distension and no mass. There is no tenderness. There is no rebound and no guarding.  Musculoskeletal: Normal range of motion. She exhibits no edema or tenderness.  Lymphadenopathy:    She has no cervical adenopathy.  Neurological: She is alert and oriented to person, place, and time. She has normal reflexes. No cranial nerve deficit. She exhibits normal muscle tone. Coordination normal.  Skin: Skin is warm and dry. No rash noted. She is not diaphoretic. No erythema. No pallor.  Psychiatric: She has a  normal mood and affect. Her behavior is normal. Judgment and thought content normal.  Vitals reviewed.   Filed Vitals:   09/10/15 1643  BP: 138/80  Pulse: 72  Height: 4' 11.5" (1.511 m)  Weight: 120 lb 3.2 oz (54.522 kg)  SpO2: 98%         Assessment & Plan:     ICD-9-CM ICD-10-CM   1. Cough 786.2 R05 Pulmonary Function Test  2. Chest crackles 786.7 R09.89 CT Chest High Resolution     Pulmonary Function Test  3. Hiatal hernia 553.3 K44.9    I will characterized as having chronic cough associated with frequent episodes. Differential diagnosis is chronic bronchitis, bronchiectasis [especially in a thin female at age 59 with a slightly abnormal  chest x-ray], cough related to acid reflux and/or made worse by ace inhibitors. Other differential diagnoses includes interstitial lung disease OR obstructive lung disease  Plan - At this point in time will have her stop the fish oil and ACE inhibitors. She is agreeable with this plan. We will see if this makes some relief with her symptoms - In 4 weeks will get pulmonary function test and high resolution CT chest brain shows no underlying problems contributing to her symptoms  Dr. Brand Males, M.D., [facility].C.P Pulmonary and Critical Care Medicine Staff Physician [facility] Pager: [phone], If no answer or between  15:00h - 7:00h: call [phone]  09/10/2015 5:28 PM

## 2015-09-10 NOTE — Patient Instructions (Addendum)
ICD-9-CM ICD-10-CM   1. Cough 786.2 R05   2. Chest crackles 786.7 R09.89   3. Hiatal hernia 553.3 K44.9    Stop fish oil until further notice Stop lisinopril but instead take losartan 25 mg daily  Monitor your blood pressure with this For now continue Symbicort and Prilosec  In 4-5 weeks do high resolution CT chest without contrast In 4-5  weeks do full pulmonary function test  Follow-up - 4-5 weeks but after completing high resolution CT chest and full pulmonary function test  - Return to see me or my nurse practitioner

## 2015-09-14 ENCOUNTER — Other Ambulatory Visit: Payer: Medicare Other

## 2015-09-15 ENCOUNTER — Ambulatory Visit (INDEPENDENT_AMBULATORY_CARE_PROVIDER_SITE_OTHER)
Admission: RE | Admit: 2015-09-15 | Discharge: 2015-09-15 | Disposition: A | Discharge status: Home / Self Care | Payer: Medicare Other | Source: Ambulatory Visit | Attending: Internal Medicine | Admitting: Internal Medicine

## 2015-09-15 DIAGNOSIS — R911 Solitary pulmonary nodule: Secondary | ICD-10-CM | POA: Diagnosis not present

## 2015-09-15 DIAGNOSIS — R0989 Other specified symptoms and signs involving the circulatory and respiratory systems: Secondary | ICD-10-CM

## 2015-09-16 ENCOUNTER — Ambulatory Visit
Admission: RE | Admit: 2015-09-16 | Discharge: 2015-09-16 | Disposition: A | Discharge status: Home / Self Care | Payer: Medicare Other | Source: Ambulatory Visit | Attending: Geriatric Medicine | Admitting: Geriatric Medicine

## 2015-09-16 ENCOUNTER — Other Ambulatory Visit: Payer: Self-pay | Admitting: Geriatric Medicine

## 2015-09-16 DIAGNOSIS — N63 Unspecified lump in breast: Secondary | ICD-10-CM | POA: Diagnosis not present

## 2015-09-16 DIAGNOSIS — R928 Other abnormal and inconclusive findings on diagnostic imaging of breast: Secondary | ICD-10-CM

## 2015-09-17 ENCOUNTER — Telehealth: Payer: Self-pay | Admitting: Internal Medicine

## 2015-09-17 MED ORDER — LOSARTAN POTASSIUM 25 MG PO TABS: 25.0000 mg | ORAL_TABLET | Freq: Every day | ORAL | Status: DC

## 2015-09-17 NOTE — Telephone Encounter (Signed)
She has bronchiectasis that can explain cough and crackles She needs to finish PFT in 4-5 weeks from last OV return to see TP at tha time - lisinopril effect woul dhave washed out by thyen   Ct Chest High Resolution  09/15/2015  CLINICAL DATA:  Cough.  Chest crackles. EXAM: CT CHEST WITHOUT CONTRAST TECHNIQUE: Multidetector CT imaging of the chest was performed following the standard protocol without intravenous contrast. High resolution imaging of the lungs, as well as inspiratory and expiratory imaging, was performed. COMPARISON:  08/28/2015 chest radiograph.  No prior chest CT. FINDINGS: Mediastinum/Nodes: Normal heart size. No pericardial fluid/thickening. Great vessels are normal in course and caliber. Normal visualized thyroid. Normal esophagus. No pathologically enlarged axillary, mediastinal or gross hilar lymph nodes, noting limited sensitivity for the detection of hilar adenopathy on this noncontrast study. Lungs/Pleura: No pneumothorax. No pleural effusion. Right lower lobe 4 mm solid pulmonary nodule (series 5/ image 39). No acute consolidative airspace disease, additional significant pulmonary nodules or lung masses. There is diffuse bronchial wall thickening. There is extensive mild cylindrical bronchiectasis throughout both lungs involving each of the lung lobes, most prominent in the right middle lobe and basilar lower lobes. There are no significant regions of subpleural reticulation, ground-glass attenuation, traction bronchiectasis or frank honeycombing. A few thin parenchymal bands in the basilar right lower lobe, right middle lobe and lingula are most in keeping with areas of postinfectious/postinflammatory scarring. There are moderate scattered areas of air trapping on the expiratory sequence. Upper abdomen: Small to moderate hiatal hernia.  Cholelithiasis. Musculoskeletal: No aggressive appearing focal osseous lesions. Mild degenerative changes in the thoracic spine. IMPRESSION: 1.  Extensive mild cylindrical bronchiectasis throughout both lungs, most prominent in the right middle lobe and basilar lower lobes. 2. Moderate air trapping, in keeping with small airways disease. 3. Multiple thin parenchymal bands in the mid to lower lungs, in keeping with areas of postinfectious/postinflammatory scarring. 4. Otherwise no evidence of interstitial lung disease. Specifically, no significant regions of subpleural reticulation, traction bronchiectasis or frank honeycombing. 5. Right lower lobe 4 mm solid pulmonary nodule. If the patient is at high risk for bronchogenic carcinoma, follow-up chest CT at 1 year is recommended. If the patient is at low risk, no follow-up is needed. This recommendation follows the consensus statement: Guidelines for Management of Small Pulmonary Nodules Detected on CT Scans: A Statement from the [employer] as published in Radiology 2005; 237:395-400. 6. Small to moderate hiatal hernia. 7. Cholelithiasis. Electronically Signed   By: Ilona Sorrel M.D.   On: 09/15/2015 18:40   US Breast Ltd Uni Right Inc Axilla  09/16/2015  CLINICAL DATA:  Patient returns after screening study for evaluation of possible right breast mass. EXAM: DIGITAL DIAGNOSTIC RIGHT MAMMOGRAM WITH 3D TOMOSYNTHESIS WITH CAD ULTRASOUND RIGHT BREAST COMPARISON:  09/04/2015 and earlier ACR Breast Density Category c: The breast tissue is heterogeneously dense, which may obscure small masses. FINDINGS: Additional tomosynthesis images are performed, confirming presence of 2 spiculated masses in the upper central portion of the right breast. Mammographic images were processed with CAD. On physical exam, I palpate no abnormality in the upper central portion of the right breast. Targeted ultrasound is performed, showing a small hypoechoic irregular mass with hyperechoic rim in the 1 o'clock location of the right breast 10 cm from the nipple. Mass measures 0.7 x 0.5 x 0.6 cm. A second mass is identified in  the 1 o'clock location 9 cm from the nipple which measures 0.7 x 0.7 x 0.6 cm.  The 2 masses are 2.4 cm apart. Evaluation of the right axilla is negative for adenopathy. IMPRESSION: 1. Two suspicious masses in the 1 o'clock location of the right breast warranting tissue diagnosis. 2. No evidence for adenopathy. RECOMMENDATION: Ultrasound-guided core biopsies are recommended and scheduled for the patient on 09/21/2015 at 2 o'clock p.m. I have discussed the findings and recommendations with the patient. Results were also provided in writing at the conclusion of the visit. If applicable, a reminder letter will be sent to the patient regarding the next appointment. BI-RADS CATEGORY  4: Suspicious. Electronically Signed   By: Nolon Nations M.D.   On: 09/16/2015 13:03   Mm Diag Breast Tomo Uni Right  09/16/2015  CLINICAL DATA:  Patient returns after screening study for evaluation of possible right breast mass. EXAM: DIGITAL DIAGNOSTIC RIGHT MAMMOGRAM WITH 3D TOMOSYNTHESIS WITH CAD ULTRASOUND RIGHT BREAST COMPARISON:  09/04/2015 and earlier ACR Breast Density Category c: The breast tissue is heterogeneously dense, which may obscure small masses. FINDINGS: Additional tomosynthesis images are performed, confirming presence of 2 spiculated masses in the upper central portion of the right breast. Mammographic images were processed with CAD. On physical exam, I palpate no abnormality in the upper central portion of the right breast. Targeted ultrasound is performed, showing a small hypoechoic irregular mass with hyperechoic rim in the 1 o'clock location of the right breast 10 cm from the nipple. Mass measures 0.7 x 0.5 x 0.6 cm. A second mass is identified in the 1 o'clock location 9 cm from the nipple which measures 0.7 x 0.7 x 0.6 cm. The 2 masses are 2.4 cm apart. Evaluation of the right axilla is negative for adenopathy. IMPRESSION: 1. Two suspicious masses in the 1 o'clock location of the right breast warranting  tissue diagnosis. 2. No evidence for adenopathy. RECOMMENDATION: Ultrasound-guided core biopsies are recommended and scheduled for the patient on 09/21/2015 at 2 o'clock p.m. I have discussed the findings and recommendations with the patient. Results were also provided in writing at the conclusion of the visit. If applicable, a reminder letter will be sent to the patient regarding the next appointment. BI-RADS CATEGORY  4: Suspicious. Electronically Signed   By: Nolon Nations M.D.   On: 09/16/2015 13:03

## 2015-09-17 NOTE — Telephone Encounter (Signed)
Called and spoke to pt. Informed pt of the results and recs per MR. PFT and OV made for 10/26/15. Pt verbalized understanding and denied any further questions or concerns at this time.

## 2015-09-18 ENCOUNTER — Other Ambulatory Visit: Payer: Medicare Other

## 2015-09-18 ENCOUNTER — Other Ambulatory Visit: Payer: Self-pay | Admitting: Emergency Medicine

## 2015-09-18 MED ORDER — LOSARTAN POTASSIUM 25 MG PO TABS: 25.0000 mg | ORAL_TABLET | Freq: Every day | ORAL | Status: DC

## 2015-09-21 ENCOUNTER — Other Ambulatory Visit: Payer: Self-pay | Admitting: Geriatric Medicine

## 2015-09-21 ENCOUNTER — Ambulatory Visit
Admission: RE | Admit: 2015-09-21 | Discharge: 2015-09-21 | Disposition: A | Discharge status: Home / Self Care | Payer: Medicare Other | Source: Ambulatory Visit | Attending: Geriatric Medicine | Admitting: Geriatric Medicine

## 2015-09-21 DIAGNOSIS — R928 Other abnormal and inconclusive findings on diagnostic imaging of breast: Secondary | ICD-10-CM

## 2015-09-21 DIAGNOSIS — C50211 Malignant neoplasm of upper-inner quadrant of right female breast: Secondary | ICD-10-CM | POA: Diagnosis not present

## 2015-09-21 DIAGNOSIS — N63 Unspecified lump in breast: Secondary | ICD-10-CM | POA: Diagnosis not present

## 2015-09-21 DIAGNOSIS — D0511 Intraductal carcinoma in situ of right breast: Secondary | ICD-10-CM | POA: Diagnosis not present

## 2015-09-29 ENCOUNTER — Ambulatory Visit: Payer: Self-pay | Admitting: Surgery

## 2015-09-29 DIAGNOSIS — C50911 Malignant neoplasm of unspecified site of right female breast: Secondary | ICD-10-CM

## 2015-09-29 NOTE — H&P (Signed)
Lori Greene 09/29/2015 2:20 PM Location: [facility] Patient #: [id number] DOB: 1941/08/26 Married / Language: English / Race: White Female  History of Present Illness Marcello Moores A. Marlin Jarrard MD; 09/29/2015 5:00 PM) Patient words: Patient sent at the request of Dr. Ammie Ferrier for right breast cancer. She underwent screening mammography which showed 2 densities right breast upper inner quadrant 1:00 within a centimeter of each other. 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 Each measures approximately 1 cm. Denies any history of breast mass nipple discharge or change in the appearance of her breasts. She has no significant family history of breast cancer except for her mother in her 4s. She is sore from her biopsy.          ADDITIONAL INFORMATION: 1. PROGNOSTIC INDICATORS Results: IMMUNOHISTOCHEMICAL AND MORPHOMETRIC ANALYSIS PERFORMED MANUALLY Estrogen Receptor: 100%, POSITIVE, STRONG STAINING INTENSITY Progesterone Receptor: 10%, POSITIVE, MODERATE STAINING INTENSITY Proliferation Marker Ki67: 10% REFERENCE RANGE ESTROGEN RECEPTOR NEGATIVE 0% POSITIVE =>1% REFERENCE RANGE PROGESTERONE RECEPTOR NEGATIVE 0% POSITIVE =>1% All controls stained appropriately Enid Cutter MD Pathologist, Electronic Signature ( Signed 09/24/2015) 1. FLUORESCENCE IN-SITU HYBRIDIZATION Results: HER2 - **POSITIVE** RATIO OF HER2/CEP17 SIGNALS 4.75 AVERAGE HER2 COPY NUMBER PER CELL 12.35 Reference Range: NEGATIVE HER2/CEP17 Ratio <2.0 and average HER2 copy number <4.0 EQUIVOCAL HER2/CEP17 Ratio <2.0 and average HER2 copy number 4.0 and <6.0 1 of 4 FINAL for Anglemyer, Bonnita R ([id number]) ADDITIONAL INFORMATION:(continued) POSITIVE HER2/CEP17 Ratio >=2.0 or <2.0 and average HER2 copy number >=6.0 Enid Cutter MD Pathologist, Electronic Signature ( Signed 09/24/2015) 2. PROGNOSTIC INDICATORS Results: IMMUNOHISTOCHEMICAL AND  MORPHOMETRIC ANALYSIS PERFORMED MANUALLY Estrogen Receptor: 100%, POSITIVE, STRONG STAINING INTENSITY Progesterone Receptor: 0%, NEGATIVE Proliferation Marker Ki67: 20% COMMENT: The negative hormone receptor study(ies) in this case has no internal positive control. REFERENCE RANGE ESTROGEN RECEPTOR NEGATIVE 0% POSITIVE =>1% REFERENCE RANGE PROGESTERONE RECEPTOR NEGATIVE 0% POSITIVE =>1% All controls stained appropriately Enid Cutter MD Pathologist, Electronic Signature ( Signed 09/24/2015) 2. FLUORESCENCE IN-SITU HYBRIDIZATION Results: HER2 - **POSITIVE** RATIO OF HER2/CEP17 SIGNALS 4.73 AVERAGE HER2 COPY NUMBER PER CELL 11.60 Reference Range: NEGATIVE HER2/CEP17 Ratio <2.0 and average HER2 copy number <4.0 EQUIVOCAL HER2/CEP17 Ratio <2.0 and average HER2 copy number 4.0 and <6.0 POSITIVE HER2/CEP17 Ratio >=2.0 or <2.0 and average HER2 copy number >=6.0 Enid Cutter MD Pathologist, Electronic Signature ( Signed 09/24/2015) 2 of 4 FINAL for Rogoff, Sharan R ([id number]) FINAL DIAGNOSIS Diagnosis 1. Breast, right, needle core biopsy, 1:00 o'clock, 9 CMFN - INVASIVE DUCTAL CARCINOMA. - DUCTAL CARCINOMA IN SITU. - SEE COMMENT. 2. Breast, right, needle core biopsy, 1:00 o'clock, 10 CMFN - INVASIVE DUCTAL CARCINOMA. - DUCTAL CARCINOMA IN SITU WITH ASSOCIATED CALCIFICATIONS. - SEE COMMENT. Microscopic Comment 1. Although definitive grading of breast carcinoma is best done on excision, the features of the invasive tumor from the right 1 o'clock needle core biopsy 9 cm from the nipple are compatible with a grade 1 to 2 breast carcinoma. Breast prognostic markers will be performed and reported in an addendum. Findings are called to the [facility] on 09/22/2015. Dr. Donato Heinz has seen the first specimen in consultation with agreement. 2. Although definitive grading of breast carcinoma is best done on excision, the features of the invasive tumor from the right 1 o'clock  biopsy 10 cm from the nipple are compatible with a grade 1 to 2 breast carcinoma. Breast prognostic markers will be performed and reported in an addendum. Findings are called to the [facility] on 09/22/2015.  Dr. Donato Heinz has seen the second specimen in consultation with agreement. (RH:ds 09/22/15) Willeen Niece MD Pathologist, Electronic Signature (Case signed 09/22/2015) Specimen Gross and Clinical Information Specimen Comment 1. In formalin 2:30, extracted <5 mins; screening detected right breast masses 2. In formalin 2:50, extracted <5 mins Specimen(s) Obtained: 1. Breast, right, needle core biopsy, 1:00 o'clock, 9 CMFN 2. Breast, right, needle core biopsy, 1:00 o'clock, 10 CMFN Specimen Clinical Information 1. Suspect malignancy Gross 1. Received in formalin are 4 cores of soft, tan-red tissue which measure 1.4 x 0.2 x 0.2 cm and up to 1.6 x 0.2 x 0.2 cm. Submitted in toto in 1 block(s). Time in Formalin 2:30 pm (sk) 2. Received in formalin are 3 cores of soft, tan-red tissue which measure 1.4 x 0.3 x 0.2 cm and up to 2.4 x 0.3 x 0.2 cm. Submitted in toto in 1 block(s). Time in Formalin 2:50 pm (sk) Stain(s) used in Diagnosis: The following stain(s) were used in diagnosing the case: KI-67-ACIS, PR-ACIS, Her2 FISH, ER-ACIS. The control(s) stained appropriately. Disclaimer PR progesterone receptor (PgR 636), immunohistochemical stains are performed on formalin fixed, paraffin embedded tissue using a 3,3"-diaminobenzidine (DAB) chromogen and [employer] 3 of 4 FINAL for Iseman, Cathy R ([id number]) Disclaimer(continued) System. The staining intensity of the nucleus is scored morphometrically using the Automated Cellular Imaging System (ACIS) and is reported as the percentage of tumor cell nuclei demonstrating specific nuclear staining. HER2 IQFISH pharmDX (code [id number]) is a direct fluorescence in-situ hybridization assay designed to quantitatively determine HER2 gene  amplification in formalin-fixed, paraffin-embedded tissue specimens. It is performed at [facility] and is reported using ASCO/CAP scoring criteria published in 2013. Ki-67 (Mib-1), immunohistochemical stains are performed on formalin fixed, paraffin embedded tissue using a 3,3"-diaminobenzidine (DAB) chromogen and [place]. The staining intensity of the nucleus is scored morphometrically using the Automated Cellular Imaging System (ACIS) and is reported as the percentage of tumor cell nuclei demonstrating specific nuclear staining. Estrogen receptor (SP1), immunohistochemical stains are performed on formalin fixed, paraffin embedded tissue using a 3,3"-diaminobenzidine (DAB) chromogen and [employer]ainer System. The staining intensity of the nucleus is scored morphometrically using the Automated Cellular Imaging System (ACIS) and is reported as the percentage of tumor cell nuclei demonstrating specific nuclear staining. Report signed out from the following location(s) Technical Component was performed at [facility]. [address],[place] [address].[id number]., Technical component and interpretation was performed at [address], [place], [place] [address]. CLIA #: [id number],      CLINICAL DATA: Patient returns after screening study for evaluation of possible right breast mass.  EXAM: DIGITAL DIAGNOSTIC RIGHT MAMMOGRAM WITH 3D TOMOSYNTHESIS WITH CAD  ULTRASOUND RIGHT BREAST  COMPARISON: 09/04/2015 and earlier  ACR Breast Density Category c: The breast tissue is heterogeneously dense, which may obscure small masses.  FINDINGS: Additional tomosynthesis images are performed, confirming presence of 2 spiculated masses in the upper central portion of the right breast.  Mammographic images were processed with CAD.  On physical exam, I palpate no abnormality in the upper  central portion of the right breast.  Targeted ultrasound is performed, showing a small hypoechoic irregular mass with hyperechoic rim in the 1 o'clock location of the right breast 10 cm from the nipple. Mass measures 0.7 x 0.5 x 0.6 cm. A second mass is identified in the 1 o'clock location 9 cm from the nipple which measures 0.7 x 0.7 x 0.6 cm. The 2 masses are 2.4 cm apart. Evaluation  of the right axilla is negative for adenopathy.  IMPRESSION: 1. Two suspicious masses in the 1 o'clock location of the right breast warranting tissue diagnosis. 2. No evidence for adenopathy.  RECOMMENDATION: Ultrasound-guided core biopsies are recommended and scheduled for the patient on 09/21/2015 at 2 o'clock p.m.  I have discussed the findings and recommendations with the patient. Results were also provided in writing at the conclusion of the.  The patient is a 74 year old female.   Other Problems Elbert Ewings, CMA; 09/29/2015 2:21 PM) Asthma Breast Cancer Gastroesophageal Reflux Disease High blood pressure Hypercholesterolemia  Past Surgical History Elbert Ewings, CMA; 09/29/2015 2:21 PM) Breast Biopsy Right. Hysterectomy (not due to cancer) - Partial  Diagnostic Studies History Elbert Ewings, CMA; 09/29/2015 2:21 PM) Colonoscopy never Mammogram within last year Pap Smear >5 years ago  Allergies Elbert Ewings, CMA; 09/29/2015 2:21 PM) Penicillin V *PENICILLINS*  Medication History Elbert Ewings, CMA; 09/29/2015 2:24 PM) ProAir HFA (108 (90 Base)MCG/ACT Aerosol Soln, Inhalation) Active. Omeprazole (40MG Capsule DR, Oral) Active. Calcium (500MG Tablet, Oral) Active. Vitamin D3 (1000UNIT Tablet, Oral) Active. Multiple Vitamin (Oral) Active. MetFORMIN HCl (500MG Tablet, Oral) Active. Losartan Potassium (25MG Tablet, Oral) Active. Montelukast Sodium (10MG Tablet, Oral) Active. Cinnamon Plus Chromium (100-500MCG-MG Capsule, Oral) Active. Garlic Oil (9937JI Capsule,  Oral) Active. Medications Reconciled  Social History Elbert Ewings, [place]; 09/29/2015 2:21 PM) Caffeine use Tea. No alcohol use No drug use Tobacco use Never smoker.  Family History Elbert Ewings, [place]; 09/29/2015 2:21 PM) Breast Cancer Mother. Hypertension Mother.  Pregnancy / Birth History Elbert Ewings, CMA; 09/29/2015 2:21 PM) Age at menarche 8 years. Gravida 0 Para 0     Review of Systems Elbert Ewings CMA; 09/29/2015 2:21 PM) General Present- Fatigue. Not Present- Appetite Loss, Chills, Fever, Night Sweats, Weight Gain and Weight Loss. Skin Not Present- Change in Wart/Mole, Dryness, Hives, Jaundice, New Lesions, Non-Healing Wounds, Rash and Ulcer. HEENT Present- Hoarseness and Wears glasses/contact lenses. Not Present- Earache, Hearing Loss, Nose Bleed, Oral Ulcers, Ringing in the Ears, Seasonal Allergies, Sinus Pain, Sore Throat, Visual Disturbances and Yellow Eyes. Respiratory Present- Chronic Cough and Snoring. Not Present- Bloody sputum, Difficulty Breathing and Wheezing. Breast Not Present- Breast Mass, Breast Pain, Nipple Discharge and Skin Changes. Cardiovascular Not Present- Chest Pain, Difficulty Breathing Lying Down, Leg Cramps, Palpitations, Rapid Heart Rate, Shortness of Breath and Swelling of Extremities. Gastrointestinal Not Present- Abdominal Pain, Bloating, Bloody Stool, Change in Bowel Habits, Chronic diarrhea, Constipation, Difficulty Swallowing, Excessive gas, Gets full quickly at meals, Hemorrhoids, Indigestion, Nausea, Rectal Pain and Vomiting. Female Genitourinary Not Present- Frequency, Nocturia, Painful Urination, Pelvic Pain and Urgency. Musculoskeletal Not Present- Back Pain, Joint Pain, Joint Stiffness, Muscle Pain, Muscle Weakness and Swelling of Extremities. Psychiatric Not Present- Anxiety, Bipolar, Change in Sleep Pattern, Depression, Fearful and Frequent crying. Endocrine Present- Hair Changes. Not Present- Cold Intolerance, Excessive Hunger,  Heat Intolerance, Hot flashes and New Diabetes. Hematology Present- Easy Bruising. Not Present- Excessive bleeding, Gland problems, HIV and Persistent Infections.  Vitals Elbert Ewings CMA; 09/29/2015 2:24 PM) 09/29/2015 2:24 PM Weight: 116.8 lb Height: 60in Body Surface Area: 1.49 m Body Mass Index: 22.81 kg/m  Temp.: 98.55F(Temporal)  Pulse: 81 (Regular)  BP: 130/64 (Sitting, Left Arm, Standard)      Physical Exam (Anedra Penafiel A. Elford Evilsizer MD; 09/29/2015 5:01 PM)  General Mental Status-Alert. General Appearance-Consistent with stated age. Hydration-Well hydrated. Voice-Normal.  Head and Neck Head-normocephalic, atraumatic with no lesions or palpable masses. Trachea-midline. Thyroid Gland Characteristics - normal size and consistency.  Chest  and Lung Exam Chest and lung exam reveals -quiet, even and easy respiratory effort with no use of accessory muscles and on auscultation, normal breath sounds, no adventitious sounds and normal vocal resonance. Inspection Chest Wall - Normal. Back - normal.  Breast Note: Bruising right breast upper inner quadrant with small hematoma. Nipple normal right breast. Left breast normal without mass lesion nipple discharge.  Cardiovascular Cardiovascular examination reveals -normal heart sounds, regular rate and rhythm with no murmurs and normal pedal pulses bilaterally.  Neurologic Neurologic evaluation reveals -alert and oriented x 3 with no impairment of recent or remote memory. Mental Status-Normal.  Musculoskeletal Normal Exam - Left-Upper Extremity Strength Normal and Lower Extremity Strength Normal. Normal Exam - Right-Upper Extremity Strength Normal and Lower Extremity Strength Normal.  Lymphatic Head & Neck  General Head & Neck Lymphatics: Bilateral - Description - Normal. Axillary  General Axillary Region: Bilateral - Description - Normal. Tenderness - Non Tender.    Assessment & Plan  (Ngan Qualls A. 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 She is a good lumpectomy candidate without neoadjuvant chemotherapy but may require a Port-A-Cath for postoperative care. Discussed mastectomy and reconstruction as well. She is very interested in proceeding with right breast lumpectomy and sentinel lymph node mapping. We'll await consultant's input and then schedule surgery after.  Current Plans Referred to Oncology, for evaluation and follow up (Oncology). Routine. Referred to Radiation Oncology, for evaluation and follow up (Radiation Oncology). Routine. Referred to Physical Therapy, for evaluation and follow up (Physical Therapy). Routine. You are being scheduled for surgery - Our schedulers will call you.  You should hear from our office's scheduling department within 5 working days about the location, date, and time of surgery. We try to make accommodations for patient's preferences in scheduling surgery, but sometimes the OR schedule or the surgeon's schedule prevents [place] from making those accommodations.  If you have not heard from our office [phone]) in 5 working days, call the office and ask for your surgeon's nurse.  If you have other questions about your diagnosis, plan, or surgery, call the office and ask for your surgeon's nurse.  Pt Education - [facility] Breast Cancer Information Given - [facility] "Breast Journey" Package We discussed the staging and pathophysiology of breast cancer. We discussed all of the different options for treatment for breast cancer including surgery, chemotherapy, radiation therapy, Herceptin, and antiestrogen therapy. We discussed a sentinel lymph node biopsy as she does not appear to having lymph node involvement right now. We discussed the performance of that with injection of  radioactive tracer and blue dye. We discussed that she would have an incision underneath her axillary hairline. We discussed that there is a bout a 10-20% chance of having a positive node with a sentinel lymph node biopsy and we will await the permanent pathology to make any other first further decisions in terms of her treatment. One of these options might be to return to the operating room to perform an axillary lymph node dissection. We discussed about a 1-2% risk lifetime of chronic shoulder pain as well as lymphedema associated with a sentinel lymph node biopsy. We discussed the options for treatment of the breast cancer which included lumpectomy versus a mastectomy. We discussed the performance of the lumpectomy with a wire placement. We discussed a 10-20% chance of a positive margin requiring reexcision in the  operating room. We also discussed that she may need radiation therapy or antiestrogen therapy or both if she undergoes lumpectomy. We discussed the mastectomy and the postoperative care for that as well. We discussed that there is no difference in her survival whether she undergoes lumpectomy with radiation therapy or antiestrogen therapy versus a mastectomy. There is a slight difference in the local recurrence rate being 3-5% with lumpectomy and about 1% with a mastectomy. We discussed the risks of operation including bleeding, infection, possible reoperation. She understands her further therapy will be based on what her stages at the time of her operation.  Pt Education - [facility] breast cancer surgery: discussed with patient and provided information. Pt Education - [facility] Breast Biopsy HCI: discussed with patient and provided information. Pt Education - ABC (After Breast Cancer) Class Info: discussed with patient and provided information.

## 2015-10-01 ENCOUNTER — Telehealth: Payer: Self-pay | Admitting: Internal Medicine

## 2015-10-01 DIAGNOSIS — K219 Gastro-esophageal reflux disease without esophagitis: Secondary | ICD-10-CM | POA: Diagnosis not present

## 2015-10-01 DIAGNOSIS — J300 Vasomotor rhinitis: Secondary | ICD-10-CM | POA: Diagnosis not present

## 2015-10-01 DIAGNOSIS — J4540 Moderate persistent asthma, uncomplicated: Secondary | ICD-10-CM | POA: Diagnosis not present

## 2015-10-01 DIAGNOSIS — R05 Cough: Secondary | ICD-10-CM | POA: Diagnosis not present

## 2015-10-01 NOTE — Telephone Encounter (Signed)
Called and spoke to [name] at [facility]. Dr. Tiajuana Amass referred pt to our office and she is now requesting the records from appt. OV note and HRCT sent to Dr. Tiajuana Amass. [name] verbalized understanding and denied any further questions or concerns at this time.

## 2015-10-02 ENCOUNTER — Telehealth: Payer: Self-pay | Admitting: *Deleted

## 2015-10-02 NOTE — Telephone Encounter (Signed)
Patient will be treated at [facility]

## 2015-10-06 ENCOUNTER — Encounter (HOSPITAL_COMMUNITY): Payer: Medicare Other | Attending: Hematology & Oncology | Admitting: Hematology & Oncology

## 2015-10-06 ENCOUNTER — Encounter (HOSPITAL_COMMUNITY): Payer: Self-pay | Admitting: Hematology & Oncology

## 2015-10-06 VITALS — BP 202/90 | HR 70 | Temp 98.0°F | Resp 18 | Ht 59.0 in | Wt 118.4 lb

## 2015-10-06 DIAGNOSIS — C50211 Malignant neoplasm of upper-inner quadrant of right female breast: Secondary | ICD-10-CM

## 2015-10-06 DIAGNOSIS — C50212 Malignant neoplasm of upper-inner quadrant of left female breast: Secondary | ICD-10-CM

## 2015-10-06 DIAGNOSIS — Z170 Estrogen receptor positive status [ER+]: Secondary | ICD-10-CM | POA: Diagnosis not present

## 2015-10-06 NOTE — Progress Notes (Signed)
[place] [facility] NOTE  Patient Care Team: Lajean Manes, MD as PCP - General  CHIEF COMPLAINTS/PURPOSE OF CONSULTATION:  Invasive ductal carcinoma triple positive  ER+, PR+, HER 2 neu +  RIGHT breast cancer, upper inner quadrant Screening mammogram on 09/04/2015 with possible R breast mass 2 densities noted in the R breast upper inner quadrant within a centimeter of each other (9 and 10 cm from the nipple)  HISTORY OF PRESENTING ILLNESS:  Lori Greene 74 y.o. female is here because of abnormal mammogram of the R breast and newly diagnosed R breast cancer.  Lori Greene is accompanied by her husband and a friend from church. I have personally reviewed the imaging and pathology results with the patient.   Lori Greene has mammograms every year. Lori Greene has been called back several times, but notes that everything has always ended up ok. Lori Greene even had an MRI once. Lori Greene believes the abnormalities were the same breast each time. Lori Greene sees Dr. Felipa Eth as PCP.  Lori Greene follows with Dr. Jinny Blossom for her asthma. Lori Greene has been coughing up "gunk" and saw a pulmonologist, Dr. Chase Caller. Lori Greene will see him again in the next few weeks for bronciectasis. Lori Greene was prescribed prednisone and antibiotics but was advised not to start them until Lori Greene spoke with medical oncology.  Her breast biopsy was performed by Dr. Brantley Stage. Lori Greene saw him in follow-up on 11/15. Lori Greene is here today for additional discussion of her diagnosis. Lori Greene notes a firmness in her right breast in the biopsy area. Lori Greene has no other major complaints.  Lori Greene has a lot of questions today.  MEDICAL HISTORY:  Past Medical History  Diagnosis Date  . Hypertension   . Diabetes mellitus without complication ([place])     SURGICAL HISTORY: Past Surgical History  Procedure Laterality Date  . Abdominal hysterectomy  1983    SOCIAL HISTORY: Social History   Social History  . Marital Status: Married    Spouse Name: N/A  . Number of  Children: N/A  . Years of Education: N/A   Occupational History  . retired    Social History Main Topics  . Smoking status: Never Smoker   . Smokeless tobacco: Never Used  . Alcohol Use: No  . Drug Use: No  . Sexual Activity: No   Other Topics Concern  . Not on file   Social History Narrative  Married 51 years. 0 children. Fostered children. 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 Lori Greene likes to sew, knit, and quilt. Lori Greene likes to walk but does not do it often. Non smoker ETOH, none Lori Greene is from [facility] and moved to [place] when Lori Greene got married.  FAMILY HISTORY: Family History  Problem Relation Age of Onset  . Breast cancer Mother   . Leukemia Father    has no family status information on file.  Father died at 44 yo of leukemia Mother died of old age; breast cancer diagnosis at 73 yo. Lori Greene had a biopsy, no treatment. 1 sister, healthy. No breast cancer.  ALLERGIES:  is allergic to penicillins.  MEDICATIONS:  Current Outpatient Prescriptions  Medication Sig Dispense Refill  . albuterol (PROAIR HFA) 108 (90 BASE) MCG/ACT inhaler Inhale 2 puffs into the lungs every 6 (six) hours as needed for wheezing or shortness of breath.    . budesonide-formoterol (SYMBICORT) 80-4.5 MCG/ACT inhaler Inhale 2 puffs into the lungs 2 (two) times daily.    . Calcium Carbonate-Vitamin D (CALCIUM +  D PO) Take 1 tablet by mouth every morning.    . cholecalciferol (VITAMIN D) 1000 UNITS tablet Take 1,000 Units by mouth every morning.    Marland Kitchen losartan (COZAAR) 25 MG tablet Take 1 tablet (25 mg total) by mouth daily. 30 tablet 1  . metFORMIN (GLUCOPHAGE) 500 MG tablet Take 500 mg by mouth daily with breakfast.     . montelukast (SINGULAIR) 10 MG tablet     . omeprazole (PRILOSEC) 40 MG capsule Take 40 mg by mouth daily.    Marland Kitchen atorvastatin (LIPITOR) 10 MG tablet Take 10 mg by mouth daily.    Marland Kitchen azithromycin (ZITHROMAX) 250 MG tablet Has not started    . fish  oil-omega-3 fatty acids 1000 MG capsule Take 1 g by mouth every morning.    . Multiple Vitamin (MULTIVITAMIN WITH MINERALS) TABS Take 1 tablet by mouth every morning.    . predniSONE (DELTASONE) 10 MG tablet Has not started    . triamterene-hydrochlorothiazide (DYAZIDE) 37.5-25 MG per capsule Take 1 capsule by mouth every morning.     No current facility-administered medications for this visit.    Review of Systems  Constitutional: Negative.  Negative for fever, chills, weight loss and malaise/fatigue.  HENT: Negative.  Negative for congestion, hearing loss, nosebleeds, sore throat and tinnitus.   Eyes: Negative.  Negative for blurred vision, double vision, pain and discharge.  Respiratory: Negative.  Negative for cough, hemoptysis, sputum production, shortness of breath and wheezing.   Cardiovascular: Negative.  Negative for chest pain, palpitations, claudication, leg swelling and PND.  Gastrointestinal: Negative.  Negative for heartburn, nausea, vomiting, abdominal pain, diarrhea, constipation, blood in stool and melena.  Genitourinary: Negative.  Negative for dysuria, urgency, frequency and hematuria.  Musculoskeletal: Negative.  Negative for myalgias, joint pain and falls.  Skin: Negative.  Negative for itching and rash.  Neurological: Negative.  Negative for dizziness, tingling, tremors, sensory change, speech change, focal weakness, seizures, loss of consciousness, weakness and headaches.  Endo/Heme/Allergies: Negative.  Does not bruise/bleed easily.  Psychiatric/Behavioral: Negative.  Negative for depression, suicidal ideas, memory loss and substance abuse. The patient is not nervous/anxious and does not have insomnia.   All other systems reviewed and are negative.  14 point ROS was done and is otherwise as detailed above or in HPI  PHYSICAL EXAMINATION: ECOG PERFORMANCE STATUS: 0 - Asymptomatic  Filed Vitals:   10/06/15 1310  BP: 202/90  Pulse: 70  Temp: 98 F (36.7 C)    Resp: 18   Filed Weights   10/06/15 1310  Weight: 118 lb 6.4 oz (53.706 kg)     Physical Exam  Constitutional: Lori Greene is oriented to person, place, and time and well-developed, well-nourished, and in no distress.  HENT:  Head: Normocephalic and atraumatic.  Nose: Nose normal.  Mouth/Throat: Oropharynx is clear and moist. No oropharyngeal exudate.  Eyes: Conjunctivae and EOM are normal. Pupils are equal, round, and reactive to light. Right eye exhibits no discharge. Left eye exhibits no discharge. No scleral icterus.  Neck: Normal range of motion. Neck supple. No tracheal deviation present. No thyromegaly present.  Cardiovascular: Normal rate, regular rhythm and normal heart sounds.  Exam reveals no gallop and no friction rub.   No murmur heard. Pulmonary/Chest: Effort normal. Lori Greene has wheezes. Lori Greene has no rales.    Expiratory wheezing.  Abdominal: Soft. Bowel sounds are normal. Lori Greene exhibits no distension and no mass. There is no tenderness. There is no rebound and no guarding.  Musculoskeletal: Normal range of motion.  Lori Greene exhibits no edema.  Lymphadenopathy:    Lori Greene has no cervical adenopathy.  Neurological: Lori Greene is alert and oriented to person, place, and time. Lori Greene has normal reflexes. No cranial nerve deficit. Gait normal. Coordination normal.  Skin: Skin is warm and dry. No rash noted.  Psychiatric: Mood, memory, affect and judgment normal.  Nursing note and vitals reviewed.   LABORATORY DATA:  I have reviewed the data as listed No results found for: WBC, HGB, HCT, MCV, PLT CMP  No results found for: NA, K, CL, CO2, GLUCOSE, BUN, CREATININE, CALCIUM, PROT, ALBUMIN, AST, ALT, ALKPHOS, BILITOT, GFRNONAA, GFRAA   RADIOGRAPHIC STUDIES: I have personally reviewed the radiological images as listed and agreed with the findings in the report.  CLINICAL DATA: Post biopsy mammogram of the right breast for clip placement.  EXAM: DIAGNOSTIC RIGHT MAMMOGRAM POST ULTRASOUND  BIOPSY  COMPARISON: Previous exam(s).  FINDINGS: Mammographic images were obtained following ultrasound guided biopsy of 2 small masses in the upper inner quadrant of the right breast. A coil shaped biopsy marking clip and a ribbon shaped biopsy marking clip are appropriately positioned at the intended site of biopsy in the right breast at approximately 1 o'clock, at 9 and 10 cm from the nipple respectively.  IMPRESSION: Appropriate positioning of the 2 biopsy marking clips in the upper-inner quadrant of the right breast at 1 o'clock.  Final Assessment: Post Procedure Mammograms for Marker Placement   Electronically Signed  By: Ammie Ferrier M.D.  On: 09/21/2015 16:16  PATHOLOGY:   ASSESSMENT & PLAN:  Invasive ductal carcinoma triple positive  ER+ (100%), PR+ (10%), HER 2 neu +  RIGHT breast cancer, upper inner quadrant Screening mammogram on 09/04/2015 with possible R breast mass 2 densities noted in the R breast upper inner quadrant within a centimeter of each other (9 and 10 cm from the nipple)   We spent time today discussing breast cancer, different types, the significance of ER+ and HER 2 neu +.  I reviewed current NCCN guidelines with her.  I discussed the importance of final surgical pathology in determining final treatment recommendations regarding systemic chemotherapy. Lori Greene is very reluctant to consider systemic therapy.  I advised her that Lori Greene needs to consider at least one year of herceptin and AI therapy. If Lori Greene is LN + I advised her we would discuss risks/benefits of pursuing additional systemic therapy. Lori Greene and her family had multiple questions and these were addressed.  I emphasized to her that final surgical pathology will be very beneficial in directing additional treatment discussions.  We went over the breast cancer information booklet together. Patient was given breast cancer information.  Lori Greene will meet with our patient navigator, Hildred Alamin, today.  [phone]  I have advised for her to start her steroid and antibiotics as per her treating pulmonologist.  I will see her again after surgery.  All questions were answered. The patient knows to call the clinic with any problems, questions or concerns.  This note was electronically signed.   This document serves as a record of services personally performed by Ancil Linsey, MD. It was created on her behalf by Arlyce Harman, a trained medical scribe. The creation of this record is based on the scribe's personal observations and the provider's statements to them. This document has been checked and approved by the attending provider.  I have reviewed the above documentation for accuracy and completeness, and I agree with the above.   Molli Hazard, MD  10/06/2015 2:34 PM

## 2015-10-06 NOTE — Patient Instructions (Addendum)
[place] at [facility] Discharge Instructions  RECOMMENDATIONS MADE BY THE CONSULTANT AND ANY TEST RESULTS WILL BE SENT TO YOUR REFERRING PHYSICIAN.   Exam completed by Dr Whitney Muse today. Ductal carcinoma breast cancer. ER+,PR+,HER2+ Lori Greene is our patient navigator and she is her to help you get started and will be doing any teaching on any chemotherapy drugs that you receive. We can get a better idea of what is going on after the surgery. Possible port-a-cath placement during surgery if you need it. Could possibly need herceptin, its IV every 3 weeks for a year.  Also possibly an oral endocrine therapy agent. As far as your diet, you can eat whatever you please but with all things in moderation. Return to see the doctor 1 weeks after surgery Please call the clinic if you have any questions or concerns.    Thank you for choosing [place] at [facility] to provide your oncology and hematology care.  To afford each patient quality time with our provider, please arrive at least 15 minutes before your scheduled appointment time.    You need to re-schedule your appointment should you arrive 10 or more minutes late.  We strive to give you quality time with our providers, and arriving late affects you and other patients whose appointments are after yours.  Also, if you no show three or more times for appointments you may be dismissed from the clinic at the providers discretion.     Again, thank you for choosing [facility].  Our hope is that these requests will decrease the amount of time that you wait before being seen by our physicians.       _____________________________________________________________  Should you have questions after your visit to [facility], please contact our office at [phone] between the hours of 8:30 a.m. and 4:30 p.m.  Voicemails left after 4:30 p.m. will not be returned until the following  business day.  For prescription refill requests, have your pharmacy contact our office.        Trastuzumab injection for infusion What is this medicine? TRASTUZUMAB (tras TOO zoo mab) is a monoclonal antibody. It is used to treat breast cancer and stomach cancer. This medicine may be used for other purposes; ask your health care provider or pharmacist if you have questions. What should I tell my health care provider before I take this medicine? They need to know if you have any of these conditions: -heart disease -heart failure -infection (especially a virus infection such as chickenpox, cold sores, or herpes) -lung or breathing disease, like asthma -recent or ongoing radiation therapy -an unusual or allergic reaction to trastuzumab, benzyl alcohol, or other medications, foods, dyes, or preservatives -pregnant or trying to get pregnant -breast-feeding How should I use this medicine? This drug is given as an infusion into a vein. It is administered in a hospital or clinic by a specially trained health care professional. Talk to your pediatrician regarding the use of this medicine in children. This medicine is not approved for use in children. Overdosage: If you think you have taken too much of this medicine contact a poison control center or emergency room at once. NOTE: This medicine is only for you. Do not share this medicine with others. What if I miss a dose? It is important not to miss a dose. Call your doctor or health care professional if you are unable to keep an appointment. What may interact with  this medicine? -doxorubicin -warfarin This list may not describe all possible interactions. Give your health care provider a list of all the medicines, herbs, non-prescription drugs, or dietary supplements you use. Also tell them if you smoke, drink alcohol, or use illegal drugs. Some items may interact with your medicine. What should I watch for while using this medicine? Visit your  doctor for checks on your progress. Report any side effects. Continue your course of treatment even though you feel ill unless your doctor tells you to stop. Call your doctor or health care professional for advice if you get a fever, chills or sore throat, or other symptoms of a cold or flu. Do not treat yourself. Try to avoid being around people who are sick. You may experience fever, chills and shaking during your first infusion. These effects are usually mild and can be treated with other medicines. Report any side effects during the infusion to your health care professional. Fever and chills usually do not happen with later infusions. Do not become pregnant while taking this medicine or for 7 months after stopping it. Women should inform their doctor if they wish to become pregnant or think they might be pregnant. Women of child-bearing potential will need to have a negative pregnancy test before starting this medicine. There is a potential for serious side effects to an unborn child. Talk to your health care professional or pharmacist for more information. Do not breast-feed an infant while taking this medicine or for 7 months after stopping it. Women must use effective birth control with this medicine. What side effects may I notice from receiving this medicine? Side effects that you should report to your doctor or other health care professional as soon as possible: -breathing difficulties -chest pain or palpitations -cough -dizziness or fainting -fever or chills, sore throat -skin rash, itching or hives -swelling of the legs or ankles -unusually weak or tired Side effects that usually do not require medical attention (report to your doctor or other health care professional if they continue or are bothersome): -loss of appetite -headache -muscle aches -nausea This list may not describe all possible side effects. Call your doctor for medical advice about side effects. You may report side  effects to FDA at 1-800-FDA-1088. Where should I keep my medicine? This drug is given in a hospital or clinic and will not be stored at home. NOTE: This sheet is a summary. It may not cover all possible information. If you have questions about this medicine, talk to your doctor, pharmacist, or health care provider.    2016, Elsevier/Gold Standard. (2015-02-06 11:49:32)        Ductal Carcinoma in Situ Ductal carcinoma in situ is a growth of abnormal cells in the breast. The abnormal cells are located in the tubes that carry milk to the nipple (milk ducts) and have not spread to other areas. Ductal carcinoma in situ is the earliest form of breast cancer. CAUSES The exact cause of ductal carcinoma in situ is not known. RISK FACTORS Risk factors for ductal carcinoma in situ include:  Age. Your risk increases as you get older.  Family history of breast cancer.  Having prior radiation treatments to your breasts or chest area.  Being overweight.  Using or having used hormones, such as estrogen.  Prior history of:  Breast cancer.  Noncancerous breast conditions.  Dense breasts.  Drinking more than one alcoholic beverage per day.  Starting menstruation before the age of 85.  Never having given birth.  Giving birth to your first child when you were over the age of 35.  Not breastfeeding, if you have given birth.  Not exercising consistently. SIGNS AND SYMPTOMS Ductal carcinoma in situ does not cause any symptoms. DIAGNOSIS  Ductal carcinoma in situ is usually discovered during a routine X-ray study of the breasts (mammogram). To diagnose the condition, your health care provider will take a tissue sample from your breast so it can be examined under a microscope (breast biopsy). TREATMENT Ductal carcinoma in situ treatment may include:  A lumpectomy. This is the removal of the area of abnormal cells, along with a ring of normal tissue. This may also be called  breast-conserving surgery.  A simple mastectomy. This is the removal of breast tissue, the nipple, and the circle of colored tissue around the nipple (areola). Sometimes, one or more lymph nodes from under the arm are also removed.  Preventative mastectomy. This is the removal of both breasts. This is usually done only if you have a very high risk of developing breast cancer.  Radiation. This is a treatment that uses X-rays to kill cancer cells.  Medicines to keep the cancer from spreading. HOME CARE INSTRUCTIONS  Take medicines only as directed by your health care provider.  Keep all follow-up visits as directed by your health care provider. This is important.  Limit alcohol intake to no more than 1 drink per day for nonpregnant women. One drink equals 12 ounces of beer, 5 ounces of wine, or 1 ounces of hard liquor. SEEK MEDICAL CARE IF: You have a fever. SEEK IMMEDIATE MEDICAL CARE IF: You have difficulty breathing.   This information is not intended to replace advice given to you by your health care provider. Make sure you discuss any questions you have with your health care provider.   Document Released: 05/28/2014 Document Reviewed: 05/28/2014 Elsevier Interactive Patient Education [employer].

## 2015-10-12 ENCOUNTER — Ambulatory Visit: Payer: Self-pay | Admitting: Surgery

## 2015-10-12 DIAGNOSIS — C50211 Malignant neoplasm of upper-inner quadrant of right female breast: Secondary | ICD-10-CM

## 2015-10-20 ENCOUNTER — Telehealth (HOSPITAL_COMMUNITY): Payer: Self-pay | Admitting: *Deleted

## 2015-10-20 NOTE — Telephone Encounter (Signed)
Pt was called on Monday 11/28 by [facility] and told they would call her back but haven't called her back. Pt hasn't tried to call them either. We will call [facility] to see what is going on.

## 2015-10-20 NOTE — Telephone Encounter (Signed)
Message left on vm @ [facility] - I believe it was Hassan Rowan - and let her know that we needed to get this patient scheduled for surgery. I asked them to call Amy or myself and give [place] a progress report.

## 2015-10-21 ENCOUNTER — Telehealth (HOSPITAL_COMMUNITY): Payer: Self-pay | Admitting: *Deleted

## 2015-10-21 NOTE — Telephone Encounter (Signed)
I called patient yesterday 10/20/15 to see if her lumpectomy had been scheduled. It had not. Pt reports that [facility] called her on 10/12/15 and told her they would call her back within 2-3 days to schedule her for surgery but they never called. [facility] rep told her that Dr. Brantley Stage had some availability the following week but they had to check with OR. Pt also reports that she did not call them back. I left a message with Hassan Rowan @ [facility] on 10/20/15 asking her to call myself or patient regarding an appt. No call back as of 10/21/15 around 10am so I called Hassan Rowan @ [facility] and left another message. No call back. Patient called me at 3pm today 10/21/15 as instructed to let me know that she had not heard from [facility]. So I called again and got a triage nurse and explained what was going on. She spoke with someone and said that there is a tentative surgery date of 11/03/15 and that someone would call the patient. They were trying to book the OR rooms and coordinate with Dr. Brantley Stage. When I called pt and told her what was going on she said that she really didn't want to do surgery near Christmas. I told her it was best to go ahead and have the surgery. I told her she could call the scheduler and see if there was any way she could get an earlier surgery date. She called and was told that she was waiting on surgery because of getting all the doctor's orders together and getting insurance verification. She told the [facility] rep that they should have already had all of that. Pt is concerned about not being contacted regarding all of this. Will notify Dr. Whitney Muse in the morning.

## 2015-10-22 ENCOUNTER — Ambulatory Visit: Payer: Self-pay | Admitting: Surgery

## 2015-10-22 ENCOUNTER — Other Ambulatory Visit: Payer: Self-pay | Admitting: Surgery

## 2015-10-22 DIAGNOSIS — C50211 Malignant neoplasm of upper-inner quadrant of right female breast: Secondary | ICD-10-CM

## 2015-10-26 ENCOUNTER — Ambulatory Visit (INDEPENDENT_AMBULATORY_CARE_PROVIDER_SITE_OTHER): Payer: Medicare Other | Admitting: Adult Health

## 2015-10-26 ENCOUNTER — Encounter: Payer: Self-pay | Admitting: Adult Health

## 2015-10-26 ENCOUNTER — Other Ambulatory Visit: Payer: Self-pay | Admitting: Internal Medicine

## 2015-10-26 ENCOUNTER — Ambulatory Visit (INDEPENDENT_AMBULATORY_CARE_PROVIDER_SITE_OTHER): Payer: Medicare Other | Admitting: Internal Medicine

## 2015-10-26 VITALS — BP 126/88 | HR 69 | Temp 98.5°F | Ht 60.0 in | Wt 118.0 lb

## 2015-10-26 DIAGNOSIS — R05 Cough: Secondary | ICD-10-CM | POA: Diagnosis not present

## 2015-10-26 DIAGNOSIS — R059 Cough, unspecified: Secondary | ICD-10-CM

## 2015-10-26 DIAGNOSIS — R911 Solitary pulmonary nodule: Secondary | ICD-10-CM

## 2015-10-26 DIAGNOSIS — R0989 Other specified symptoms and signs involving the circulatory and respiratory systems: Secondary | ICD-10-CM

## 2015-10-26 DIAGNOSIS — R0600 Dyspnea, unspecified: Secondary | ICD-10-CM

## 2015-10-26 DIAGNOSIS — I10 Essential (primary) hypertension: Secondary | ICD-10-CM

## 2015-10-26 LAB — PULMONARY FUNCTION TEST
DL/VA % PRED: 104 %
DL/VA: 4.41 ml/min/mmHg/L
DLCO unc % pred: 96 %
DLCO unc: 18.22 ml/min/mmHg
FEF 25-75 POST: 1.28 L/s
FEF 25-75 Pre: 1.29 L/sec
FEF2575-%CHANGE-POST: -1 %
FEF2575-%PRED-POST: 84 %
FEF2575-%PRED-PRE: 85 %
FEV1-%CHANGE-POST: 0 %
FEV1-%PRED-PRE: 92 %
FEV1-%Pred-Post: 92 %
FEV1-PRE: 1.66 L
FEV1-Post: 1.66 L
FEV1FVC-%CHANGE-POST: 1 %
FEV1FVC-%PRED-PRE: 100 %
FEV6-%Change-Post: -1 %
FEV6-%Pred-Post: 95 %
FEV6-%Pred-Pre: 97 %
FEV6-Post: 2.18 L
FEV6-Pre: 2.21 L
FEV6FVC-%Change-Post: 0 %
FEV6FVC-%Pred-Post: 105 %
FEV6FVC-%Pred-Pre: 105 %
FVC-%CHANGE-POST: -1 %
FVC-%PRED-POST: 90 %
FVC-%PRED-PRE: 92 %
FVC-POST: 2.18 L
FVC-PRE: 2.21 L
PRE FEV1/FVC RATIO: 75 %
PRE FEV6/FVC RATIO: 100 %
Post FEV1/FVC ratio: 76 %
Post FEV6/FVC ratio: 100 %
RV % pred: 115 %
RV: 2.39 L
TLC % pred: 104 %
TLC: 4.67 L

## 2015-10-26 MED ORDER — BISOPROLOL FUMARATE 5 MG PO TABS: 5.0000 mg | ORAL_TABLET | Freq: Every day | ORAL | Status: DC

## 2015-10-26 NOTE — Patient Instructions (Addendum)
Continue on Symbicort 2 puffs Twice daily  , rinse after use.  Continue on Prilosec 40mg  daily .  Add Pepcid 20mg  At bedtime   GERD diet .  Stop Losartan .  Begin Bisoprolol 5mg  daily for blood pressure  Follow up with primary for further blood pressure management.  Mucinex DM Twice daily  As needed  Cough/congestion .  Avoid MINTS .  Follow up with Dr. Chase Caller in 3 months and As needed   Please contact office for sooner follow up if symptoms do not improve or worsen or seek emergency care

## 2015-10-26 NOTE — Progress Notes (Signed)
Subjective:    Patient ID: Lori Greene, female    DOB: 29-Dec-1940, 74 y.o.   MRN: [id number]  HPI 74 yo female seen for pulmonary consult in October 2016 for cough .   10/26/2015 Follow up : Cough  Pt returns for 6 week follow up for cough.  Pt was seen last ov for pulmonary consult for cough.  She was taken off of Lisinopril . She says her cough is some better. She was started on Losartan . Complains that she has developed a rash since starting this. Has a few whelps pop up along her arms and legs .  She does have severe GERD despite taking Prilosec  Denies hemoptysis , chest pain, orthopnea, or edema.   CT chest on 11/1 showed extensive mild bronchiectasis , neg for ILD . Small 4 mm pulmonary nodule noted -she is a never smoker .  PFT today shows normal lung function with no airflow obstruction or restriction. nml DLCO .  O2 sats 97%.   Recently dx with breast cancer , plans for lumpectomy next week.    Past Medical History  Diagnosis Date  . Hypertension   . Diabetes mellitus without complication [facility])    Current Outpatient Prescriptions on File Prior to Visit  Medication Sig Dispense Refill  . albuterol (PROAIR HFA) 108 (90 BASE) MCG/ACT inhaler Inhale 2 puffs into the lungs every 6 (six) hours as needed for wheezing or shortness of breath.    Marland Kitchen atorvastatin (LIPITOR) 10 MG tablet Take 10 mg by mouth daily.    . budesonide-formoterol (SYMBICORT) 80-4.5 MCG/ACT inhaler Inhale 2 puffs into the lungs 2 (two) times daily.    . Calcium Carbonate-Vitamin D (CALCIUM + D PO) Take 1 tablet by mouth every morning.    . cholecalciferol (VITAMIN D) 1000 UNITS tablet Take 1,000 Units by mouth every morning.    . metFORMIN (GLUCOPHAGE) 500 MG tablet Take 500 mg by mouth daily with breakfast.     . montelukast (SINGULAIR) 10 MG tablet     . Multiple Vitamin (MULTIVITAMIN WITH MINERALS) TABS Take 1 tablet by mouth every morning.    Marland Kitchen omeprazole (PRILOSEC) 40 MG capsule Take 40 mg by mouth  daily.    . fish oil-omega-3 fatty acids 1000 MG capsule Take 1 g by mouth every morning.    . triamterene-hydrochlorothiazide (DYAZIDE) 37.5-25 MG per capsule Take 1 capsule by mouth every morning.     No current facility-administered medications on file prior to visit.      Review of Systems Constitutional:   No  weight loss, night sweats,  Fevers, chills, fatigue, or  lassitude.  HEENT:   No headaches,  Difficulty swallowing,  Tooth/dental problems, or  Sore throat,                No sneezing, itching, ear ache,  +nasal congestion, post nasal drip,   CV:  No chest pain,  Orthopnea, PND, swelling in lower extremities, anasarca, dizziness, palpitations, syncope.   GI  No abdominal pain, nausea, vomiting, diarrhea, change in bowel habits, loss of appetite, bloody stools.   Resp:   No chest wall deformity  Skin: no rash or lesions.  GU: no dysuria, change in color of urine, no urgency or frequency.  No flank pain, no hematuria   MS:  No joint pain or swelling.  No decreased range of motion.  No back pain.  Psych:  No change in mood or affect. No depression or anxiety.  No memory  loss.         Objective:   Physical Exam GEN: A/Ox3; pleasant , NAD, elderly   HEENT:  [place]/AT,  EACs-clear, TMs-wnl, NOSE-clear, THROAT-clear, no lesions, no postnasal drip or exudate noted.   NECK:  Supple w/ fair ROM; no JVD; normal carotid impulses w/o bruits; no thyromegaly or nodules palpated; no lymphadenopathy.  RESP  Clear  P & A; w/o, wheezes/ rales/ or rhonchi.no accessory muscle use, no dullness to percussion  CARD:  RRR, no m/r/g  , no peripheral edema, pulses intact, no cyanosis or clubbing.  GI:   Soft & nt; nml bowel sounds; no organomegaly or masses detected.  Musco: Warm bil, no deformities or joint swelling noted.   Neuro: alert, no focal deficits noted.    Skin: Warm, hive along upper forearm x 2          Assessment & Plan:

## 2015-10-26 NOTE — Assessment & Plan Note (Signed)
Small 17mm nodule seen in on CT  Pt is a never smoker  Will follow up on CT chest in 1 year (lower risk however new dx breast cancer )

## 2015-10-26 NOTE — Assessment & Plan Note (Signed)
Suspect is multifactoral with bronchiectasis on CT chest +GERD   Plan  Continue on Symbicort 2 puffs Twice daily  , rinse after use.  Continue on Prilosec 40mg  daily .  Add Pepcid 20mg  At bedtime   GERD diet .  Stop Losartan .  Begin Bisoprolol 5mg  daily for blood pressure  Follow up with primary for further blood pressure management.  Mucinex DM Twice daily  As needed  Cough/congestion .  Avoid MINTS .  Follow up with Dr. Chase Caller in 3 months and As needed   Please contact office for sooner follow up if symptoms do not improve or worsen or seek emergency care

## 2015-10-26 NOTE — Assessment & Plan Note (Signed)
ACE related cough , now off lisinopril .  ? Hives with Losartan .  Will change to bisoprolol 5mg  daily  follow up with PCP for b/p management.

## 2015-10-26 NOTE — Progress Notes (Signed)
PFT done today. 

## 2015-10-27 ENCOUNTER — Other Ambulatory Visit: Payer: Self-pay | Admitting: Adult Health

## 2015-10-27 DIAGNOSIS — R911 Solitary pulmonary nodule: Secondary | ICD-10-CM

## 2015-10-28 ENCOUNTER — Encounter (HOSPITAL_BASED_OUTPATIENT_CLINIC_OR_DEPARTMENT_OTHER): Payer: Self-pay | Admitting: *Deleted

## 2015-10-29 ENCOUNTER — Encounter (HOSPITAL_BASED_OUTPATIENT_CLINIC_OR_DEPARTMENT_OTHER)
Admission: RE | Admit: 2015-10-29 | Discharge: 2015-10-29 | Disposition: A | Discharge status: Home / Self Care | Payer: Medicare Other | Source: Ambulatory Visit | Attending: Surgery | Admitting: Surgery

## 2015-10-29 DIAGNOSIS — Z01812 Encounter for preprocedural laboratory examination: Secondary | ICD-10-CM | POA: Diagnosis not present

## 2015-10-29 DIAGNOSIS — Z01810 Encounter for preprocedural cardiovascular examination: Secondary | ICD-10-CM | POA: Diagnosis not present

## 2015-10-29 DIAGNOSIS — C50911 Malignant neoplasm of unspecified site of right female breast: Secondary | ICD-10-CM | POA: Insufficient documentation

## 2015-10-29 LAB — COMPREHENSIVE METABOLIC PANEL
ALBUMIN: 4 g/dL (ref 3.5–5.0)
ALK PHOS: 102 U/L (ref 38–126)
ALT: 13 U/L — AB (ref 14–54)
AST: 19 U/L (ref 15–41)
Anion gap: 8 (ref 5–15)
BILIRUBIN TOTAL: 1.1 mg/dL (ref 0.3–1.2)
BUN: 9 mg/dL (ref 6–20)
CO2: 27 mmol/L (ref 22–32)
CREATININE: 1.12 mg/dL — AB (ref 0.44–1.00)
Calcium: 9.5 mg/dL (ref 8.9–10.3)
Chloride: 104 mmol/L (ref 101–111)
GFR calc Af Amer: 55 mL/min — ABNORMAL LOW (ref >60)
GFR calc non Af Amer: 48 mL/min — ABNORMAL LOW (ref >60)
GLUCOSE: 141 mg/dL — AB (ref 65–99)
POTASSIUM: 3.8 mmol/L (ref 3.5–5.1)
Sodium: 139 mmol/L (ref 135–145)
TOTAL PROTEIN: 6.6 g/dL (ref 6.5–8.1)

## 2015-10-29 LAB — CBC WITH DIFFERENTIAL/PLATELET
BASOS ABS: 0.2 10*3/uL — AB (ref 0.0–0.1)
BASOS PCT: 2 %
Eosinophils Absolute: 0.8 10*3/uL — ABNORMAL HIGH (ref 0.0–0.7)
Eosinophils Relative: 8 %
HEMATOCRIT: 41.2 % (ref 36.0–46.0)
HEMOGLOBIN: 12.9 g/dL (ref 12.0–15.0)
LYMPHS PCT: 29 %
Lymphs Abs: 2.7 10*3/uL (ref 0.7–4.0)
MCH: 29.8 pg (ref 26.0–34.0)
MCHC: 31.3 g/dL (ref 30.0–36.0)
MCV: 95.2 fL (ref 78.0–100.0)
Monocytes Absolute: 0.4 10*3/uL (ref 0.1–1.0)
Monocytes Relative: 5 %
NEUTROS ABS: 5.4 10*3/uL (ref 1.7–7.7)
NEUTROS PCT: 56 %
Platelets: 307 10*3/uL (ref 150–400)
RBC: 4.33 MIL/uL (ref 3.87–5.11)
RDW: 12.9 % (ref 11.5–15.5)
WBC: 9.4 10*3/uL (ref 4.0–10.5)

## 2015-10-30 ENCOUNTER — Other Ambulatory Visit: Payer: Self-pay | Admitting: Internal Medicine

## 2015-11-02 ENCOUNTER — Ambulatory Visit
Admission: RE | Admit: 2015-11-02 | Discharge: 2015-11-02 | Disposition: A | Discharge status: Home / Self Care | Payer: Medicare Other | Source: Ambulatory Visit | Attending: Surgery | Admitting: Surgery

## 2015-11-02 ENCOUNTER — Telehealth: Payer: Self-pay | Admitting: Adult Health

## 2015-11-02 DIAGNOSIS — C50211 Malignant neoplasm of upper-inner quadrant of right female breast: Secondary | ICD-10-CM

## 2015-11-02 DIAGNOSIS — D0511 Intraductal carcinoma in situ of right breast: Secondary | ICD-10-CM | POA: Diagnosis not present

## 2015-11-02 NOTE — Telephone Encounter (Signed)
Called and spoke with Lori Greene at [facility] - states that the patient's BP is very high 212/94 Pt is scheduled for breast surgery tomorrow and stated that this needs to be under control before she is operated on.  Pt PCP cannot see her today TP was the last to see the patient and BP meds were changed at that [place] on 10/26/15 Patient Instructions     Continue on Symbicort 2 puffs Twice daily , rinse after use.  Continue on Prilosec 40mg  daily .  Add Pepcid 20mg  At bedtime  GERD diet .  Stop Losartan .  Begin Bisoprolol 5mg  daily for blood pressure  Follow up with primary for further blood pressure management.  Mucinex DM Twice daily As needed Cough/congestion .  Avoid MINTS .  Follow up with Dr. Chase Caller in 3 months and As needed  Please contact office for sooner follow up if symptoms do not improve or worsen or seek emergency care    Please advise Lori Greene. Pt is in their office and they are not letting her leave until we call them back.

## 2015-11-02 NOTE — Telephone Encounter (Signed)
Unsure why her b/p is this high , I changed her d/t rash on losartan. She is not to follow up with PCP for further b/p control .  If her b/p is this high she will need emergent evaluation Have they rechecked her b/p . If it does not go down will need to go to ER for evaluation.  Need PCP follow up immediately for b/p issues.  Please contact office for sooner follow up if symptoms do not improve or worsen or seek emergency care

## 2015-11-02 NOTE — Telephone Encounter (Signed)
Called and spoke with Jeani Hawking at the [place] stated that she would recheck BP and if still elevated she would tell pt Tammy's rec of ER evaluation  Nothing further is needed at this time

## 2015-11-03 ENCOUNTER — Ambulatory Visit (HOSPITAL_BASED_OUTPATIENT_CLINIC_OR_DEPARTMENT_OTHER)
Admission: RE | Admit: 2015-11-03 | Discharge: 2015-11-03 | Disposition: A | Discharge status: Home / Self Care | Payer: Medicare Other | Source: Ambulatory Visit | Attending: Surgery | Admitting: Surgery

## 2015-11-03 ENCOUNTER — Ambulatory Visit (HOSPITAL_BASED_OUTPATIENT_CLINIC_OR_DEPARTMENT_OTHER): Payer: Medicare Other | Admitting: Anesthesiology

## 2015-11-03 ENCOUNTER — Encounter (HOSPITAL_BASED_OUTPATIENT_CLINIC_OR_DEPARTMENT_OTHER): Payer: Self-pay | Admitting: Anesthesiology

## 2015-11-03 ENCOUNTER — Encounter (HOSPITAL_COMMUNITY)
Admission: RE | Admit: 2015-11-03 | Discharge: 2015-11-03 | Disposition: A | Discharge status: Home / Self Care | Payer: Medicare Other | Source: Ambulatory Visit | Attending: Surgery | Admitting: Surgery

## 2015-11-03 ENCOUNTER — Ambulatory Visit
Admission: RE | Admit: 2015-11-03 | Discharge: 2015-11-03 | Disposition: A | Discharge status: Home / Self Care | Payer: Medicare Other | Source: Ambulatory Visit | Attending: Surgery | Admitting: Surgery

## 2015-11-03 ENCOUNTER — Ambulatory Visit (HOSPITAL_COMMUNITY): Payer: Medicare Other

## 2015-11-03 ENCOUNTER — Encounter (HOSPITAL_BASED_OUTPATIENT_CLINIC_OR_DEPARTMENT_OTHER)
Admission: RE | Disposition: A | Discharge status: Home / Self Care | Payer: Self-pay | Source: Ambulatory Visit | Attending: Surgery

## 2015-11-03 DIAGNOSIS — E119 Type 2 diabetes mellitus without complications: Secondary | ICD-10-CM | POA: Insufficient documentation

## 2015-11-03 DIAGNOSIS — C50911 Malignant neoplasm of unspecified site of right female breast: Secondary | ICD-10-CM | POA: Diagnosis not present

## 2015-11-03 DIAGNOSIS — Z170 Estrogen receptor positive status [ER+]: Secondary | ICD-10-CM | POA: Diagnosis not present

## 2015-11-03 DIAGNOSIS — C50211 Malignant neoplasm of upper-inner quadrant of right female breast: Secondary | ICD-10-CM

## 2015-11-03 DIAGNOSIS — C50919 Malignant neoplasm of unspecified site of unspecified female breast: Secondary | ICD-10-CM | POA: Diagnosis not present

## 2015-11-03 DIAGNOSIS — I10 Essential (primary) hypertension: Secondary | ICD-10-CM | POA: Diagnosis not present

## 2015-11-03 DIAGNOSIS — Z9011 Acquired absence of right breast and nipple: Secondary | ICD-10-CM | POA: Diagnosis not present

## 2015-11-03 DIAGNOSIS — Z95828 Presence of other vascular implants and grafts: Secondary | ICD-10-CM

## 2015-11-03 DIAGNOSIS — R079 Chest pain, unspecified: Secondary | ICD-10-CM | POA: Diagnosis not present

## 2015-11-03 DIAGNOSIS — G8918 Other acute postprocedural pain: Secondary | ICD-10-CM | POA: Diagnosis not present

## 2015-11-03 HISTORY — DX: Malignant (primary) neoplasm, unspecified: C80.1

## 2015-11-03 HISTORY — PX: PORTACATH PLACEMENT: SHX2246

## 2015-11-03 HISTORY — PX: BREAST LUMPECTOMY WITH RADIOACTIVE SEED AND SENTINEL LYMPH NODE BIOPSY: SHX6550

## 2015-11-03 HISTORY — DX: Gastro-esophageal reflux disease without esophagitis: K21.9

## 2015-11-03 HISTORY — DX: Unspecified asthma, uncomplicated: J45.909

## 2015-11-03 LAB — GLUCOSE, CAPILLARY
GLUCOSE-CAPILLARY: 129 mg/dL — AB (ref 65–99)
GLUCOSE-CAPILLARY: 127 mg/dL — AB (ref 65–99)

## 2015-11-03 SURGERY — BREAST LUMPECTOMY WITH RADIOACTIVE SEED AND SENTINEL LYMPH NODE BIOPSY
Anesthesia: General | Site: Chest | Laterality: Right

## 2015-11-03 MED ORDER — OXYCODONE-ACETAMINOPHEN 5-325 MG PO TABS: 1.0000 | ORAL_TABLET | ORAL | Status: DC | PRN

## 2015-11-03 MED ORDER — OXYCODONE HCL 5 MG PO TABS: 5.0000 mg | ORAL_TABLET | Freq: Once | ORAL | Status: DC | PRN

## 2015-11-03 MED ORDER — HYDROMORPHONE HCL 1 MG/ML IJ SOLN
INTRAMUSCULAR | Status: AC
  Filled 2015-11-03: qty 1

## 2015-11-03 MED ORDER — MIDAZOLAM HCL 2 MG/2ML IJ SOLN
1.0000 mg | INTRAMUSCULAR | Status: DC | PRN
  Administered 2015-11-03: 2 mg via INTRAVENOUS

## 2015-11-03 MED ORDER — FENTANYL CITRATE (PF) 100 MCG/2ML IJ SOLN
INTRAMUSCULAR | Status: AC
  Filled 2015-11-03: qty 2

## 2015-11-03 MED ORDER — CHLORHEXIDINE GLUCONATE 4 % EX LIQD: 1.0000 "application " | Freq: Once | CUTANEOUS | Status: DC

## 2015-11-03 MED ORDER — TECHNETIUM TC 99M SULFUR COLLOID FILTERED
1.0000 | Freq: Once | INTRAVENOUS | Status: AC | PRN
  Administered 2015-11-03: 1 via INTRADERMAL

## 2015-11-03 MED ORDER — CLINDAMYCIN PHOSPHATE 300 MG/50ML IV SOLN
300.0000 mg | Freq: Once | INTRAVENOUS | Status: AC
  Administered 2015-11-03: 300 mg via INTRAVENOUS

## 2015-11-03 MED ORDER — SCOPOLAMINE 1 MG/3DAYS TD PT72: 1.0000 | MEDICATED_PATCH | Freq: Once | TRANSDERMAL | Status: DC

## 2015-11-03 MED ORDER — EPHEDRINE SULFATE 50 MG/ML IJ SOLN
INTRAMUSCULAR | Status: DC | PRN
  Administered 2015-11-03: 10 mg via INTRAVENOUS

## 2015-11-03 MED ORDER — MIDAZOLAM HCL 2 MG/2ML IJ SOLN
INTRAMUSCULAR | Status: AC
  Filled 2015-11-03: qty 2

## 2015-11-03 MED ORDER — LIDOCAINE HCL (CARDIAC) 20 MG/ML IV SOLN
INTRAVENOUS | Status: AC
  Filled 2015-11-03: qty 5

## 2015-11-03 MED ORDER — CLINDAMYCIN PHOSPHATE 300 MG/50ML IV SOLN
INTRAVENOUS | Status: AC
  Filled 2015-11-03: qty 50

## 2015-11-03 MED ORDER — FENTANYL CITRATE (PF) 100 MCG/2ML IJ SOLN
50.0000 ug | INTRAMUSCULAR | Status: DC | PRN
  Administered 2015-11-03: 100 ug via INTRAVENOUS

## 2015-11-03 MED ORDER — MEPERIDINE HCL 25 MG/ML IJ SOLN: 6.2500 mg | INTRAMUSCULAR | Status: DC | PRN

## 2015-11-03 MED ORDER — GLYCOPYRROLATE 0.2 MG/ML IJ SOLN
INTRAMUSCULAR | Status: DC | PRN
  Administered 2015-11-03: 0.2 mg via INTRAVENOUS

## 2015-11-03 MED ORDER — CLINDAMYCIN PHOSPHATE 300 MG/50ML IV SOLN: 300.0000 mg | Freq: Once | INTRAVENOUS | Status: DC

## 2015-11-03 MED ORDER — ONDANSETRON HCL 4 MG/2ML IJ SOLN
INTRAMUSCULAR | Status: AC
  Filled 2015-11-03: qty 2

## 2015-11-03 MED ORDER — BUPIVACAINE-EPINEPHRINE 0.25% -1:200000 IJ SOLN
INTRAMUSCULAR | Status: DC | PRN
  Administered 2015-11-03: 10 mL

## 2015-11-03 MED ORDER — ONDANSETRON HCL 4 MG/2ML IJ SOLN
4.0000 mg | Freq: Once | INTRAMUSCULAR | Status: AC
  Administered 2015-11-03: 4 mg via INTRAVENOUS

## 2015-11-03 MED ORDER — BUPIVACAINE-EPINEPHRINE (PF) 0.25% -1:200000 IJ SOLN
INTRAMUSCULAR | Status: AC
  Filled 2015-11-03: qty 30

## 2015-11-03 MED ORDER — SODIUM CHLORIDE 0.9 % IJ SOLN
INTRAMUSCULAR | Status: AC
  Filled 2015-11-03: qty 10

## 2015-11-03 MED ORDER — GLYCOPYRROLATE 0.2 MG/ML IJ SOLN
INTRAMUSCULAR | Status: AC
  Filled 2015-11-03: qty 1

## 2015-11-03 MED ORDER — OXYCODONE HCL 5 MG/5ML PO SOLN: 5.0000 mg | Freq: Once | ORAL | Status: DC | PRN

## 2015-11-03 MED ORDER — DEXAMETHASONE SODIUM PHOSPHATE 4 MG/ML IJ SOLN
INTRAMUSCULAR | Status: DC | PRN
  Administered 2015-11-03: 10 mg via INTRAVENOUS

## 2015-11-03 MED ORDER — GLYCOPYRROLATE 0.2 MG/ML IJ SOLN: 0.2000 mg | Freq: Once | INTRAMUSCULAR | Status: DC | PRN

## 2015-11-03 MED ORDER — HEPARIN (PORCINE) IN NACL 2-0.9 UNIT/ML-% IJ SOLN
INTRAMUSCULAR | Status: AC
  Filled 2015-11-03: qty 500

## 2015-11-03 MED ORDER — HEPARIN SOD (PORK) LOCK FLUSH 100 UNIT/ML IV SOLN
INTRAVENOUS | Status: AC
  Filled 2015-11-03: qty 5

## 2015-11-03 MED ORDER — HYDROMORPHONE HCL 1 MG/ML IJ SOLN
0.2500 mg | INTRAMUSCULAR | Status: DC | PRN
  Administered 2015-11-03 (×2): 0.25 mg via INTRAVENOUS

## 2015-11-03 MED ORDER — DEXAMETHASONE SODIUM PHOSPHATE 10 MG/ML IJ SOLN
INTRAMUSCULAR | Status: AC
  Filled 2015-11-03: qty 1

## 2015-11-03 MED ORDER — PROPOFOL 10 MG/ML IV BOLUS
INTRAVENOUS | Status: AC
  Filled 2015-11-03: qty 40

## 2015-11-03 MED ORDER — HEPARIN SOD (PORK) LOCK FLUSH 100 UNIT/ML IV SOLN
INTRAVENOUS | Status: DC | PRN
  Administered 2015-11-03: 500 [IU]

## 2015-11-03 MED ORDER — BUPIVACAINE-EPINEPHRINE (PF) 0.5% -1:200000 IJ SOLN
INTRAMUSCULAR | Status: DC | PRN
  Administered 2015-11-03: 25 mL via PERINEURAL

## 2015-11-03 MED ORDER — FENTANYL CITRATE (PF) 100 MCG/2ML IJ SOLN
INTRAMUSCULAR | Status: DC | PRN
  Administered 2015-11-03: 100 ug via INTRAVENOUS

## 2015-11-03 MED ORDER — METHYLENE BLUE 1 % INJ SOLN
INTRAMUSCULAR | Status: AC
  Filled 2015-11-03: qty 10

## 2015-11-03 MED ORDER — LACTATED RINGERS IV SOLN
INTRAVENOUS | Status: DC
  Administered 2015-11-03 (×2): via INTRAVENOUS

## 2015-11-03 MED ORDER — PROPOFOL 10 MG/ML IV BOLUS
INTRAVENOUS | Status: DC | PRN
  Administered 2015-11-03: 180 mg via INTRAVENOUS

## 2015-11-03 MED ORDER — HEPARIN (PORCINE) IN NACL 2-0.9 UNIT/ML-% IJ SOLN
INTRAMUSCULAR | Status: DC | PRN
  Administered 2015-11-03: 500 mL via INTRAVENOUS

## 2015-11-03 MED ORDER — LIDOCAINE HCL (CARDIAC) 20 MG/ML IV SOLN
INTRAVENOUS | Status: DC | PRN
  Administered 2015-11-03: 30 mg via INTRAVENOUS

## 2015-11-03 MED ORDER — EPHEDRINE SULFATE 50 MG/ML IJ SOLN
INTRAMUSCULAR | Status: AC
  Filled 2015-11-03: qty 1

## 2015-11-03 SURGICAL SUPPLY — 68 items
APPLIER CLIP 9.375 MED OPEN (MISCELLANEOUS) ×4
BAG DECANTER FOR FLEXI CONT (MISCELLANEOUS) ×4 IMPLANT
BENZOIN TINCTURE PRP APPL 2/3 (GAUZE/BANDAGES/DRESSINGS) IMPLANT
BINDER BREAST LRG (GAUZE/BANDAGES/DRESSINGS) IMPLANT
BINDER BREAST MEDIUM (GAUZE/BANDAGES/DRESSINGS) ×4 IMPLANT
BINDER BREAST XLRG (GAUZE/BANDAGES/DRESSINGS) IMPLANT
BINDER BREAST XXLRG (GAUZE/BANDAGES/DRESSINGS) IMPLANT
BLADE HEX COATED 2.75 (ELECTRODE) ×4 IMPLANT
BLADE SURG 11 STRL SS (BLADE) ×4 IMPLANT
BLADE SURG 15 STRL LF DISP TIS (BLADE) ×2 IMPLANT
BLADE SURG 15 STRL SS (BLADE) ×2
CANISTER SUCT 1200ML W/VALVE (MISCELLANEOUS) ×4 IMPLANT
CHLORAPREP W/TINT 26ML (MISCELLANEOUS) ×4 IMPLANT
CLIP APPLIE 9.375 MED OPEN (MISCELLANEOUS) ×2 IMPLANT
CLOSURE WOUND 1/2 X4 (GAUZE/BANDAGES/DRESSINGS)
COVER BACK TABLE 60X90IN (DRAPES) ×4 IMPLANT
COVER MAYO STAND STRL (DRAPES) ×4 IMPLANT
COVER PROBE 5X48 (MISCELLANEOUS) ×2
COVER PROBE W GEL 5X96 (DRAPES) ×4 IMPLANT
DECANTER SPIKE VIAL GLASS SM (MISCELLANEOUS) IMPLANT
DEVICE DUBIN W/COMP PLATE 8390 (MISCELLANEOUS) ×4 IMPLANT
DRAPE C-ARM 42X72 X-RAY (DRAPES) ×4 IMPLANT
DRAPE LAPAROSCOPIC ABDOMINAL (DRAPES) ×4 IMPLANT
DRAPE UTILITY XL STRL (DRAPES) ×4 IMPLANT
DRSG TEGADERM 2-3/8X2-3/4 SM (GAUZE/BANDAGES/DRESSINGS) IMPLANT
ELECT COATED BLADE 2.86 ST (ELECTRODE) ×4 IMPLANT
ELECT REM PT RETURN 9FT ADLT (ELECTROSURGICAL) ×4
ELECTRODE REM PT RTRN 9FT ADLT (ELECTROSURGICAL) ×2 IMPLANT
GEL ULTRASOUND 8.5O AQUASONIC (MISCELLANEOUS) ×4 IMPLANT
GLOVE BIOGEL PI IND STRL 8 (GLOVE) ×2 IMPLANT
GLOVE BIOGEL PI INDICATOR 8 (GLOVE) ×2
GLOVE ECLIPSE 8.0 STRL XLNG CF (GLOVE) ×4 IMPLANT
GOWN STRL REUS W/ TWL LRG LVL3 (GOWN DISPOSABLE) ×4 IMPLANT
GOWN STRL REUS W/TWL LRG LVL3 (GOWN DISPOSABLE) ×4
HEMOSTAT SNOW SURGICEL 2X4 (HEMOSTASIS) IMPLANT
IV KIT MINILOC 20X1 SAFETY (NEEDLE) IMPLANT
KIT CVR 48X5XPRB PLUP LF (MISCELLANEOUS) ×2 IMPLANT
KIT MARKER MARGIN INK (KITS) ×4 IMPLANT
KIT PORT POWER 8FR ISP CVUE (Catheter) ×4 IMPLANT
LIQUID BAND (GAUZE/BANDAGES/DRESSINGS) ×4 IMPLANT
NDL SAFETY ECLIPSE 18X1.5 (NEEDLE) IMPLANT
NEEDLE HYPO 18GX1.5 SHARP (NEEDLE)
NEEDLE HYPO 22GX1.5 SAFETY (NEEDLE) IMPLANT
NEEDLE HYPO 25X1 1.5 SAFETY (NEEDLE) ×4 IMPLANT
NEEDLE SPNL 22GX3.5 QUINCKE BK (NEEDLE) IMPLANT
NS IRRIG 1000ML POUR BTL (IV SOLUTION) ×4 IMPLANT
PACK BASIN DAY SURGERY FS (CUSTOM PROCEDURE TRAY) ×4 IMPLANT
PENCIL BUTTON HOLSTER BLD 10FT (ELECTRODE) ×4 IMPLANT
SET SHEATH INTRODUCER 10FR (MISCELLANEOUS) IMPLANT
SHEATH COOK PEEL AWAY SET 9F (SHEATH) IMPLANT
SLEEVE SCD COMPRESS KNEE MED (MISCELLANEOUS) ×4 IMPLANT
SPONGE GAUZE 4X4 12PLY STER LF (GAUZE/BANDAGES/DRESSINGS) IMPLANT
SPONGE LAP 4X18 X RAY DECT (DISPOSABLE) ×8 IMPLANT
STRIP CLOSURE SKIN 1/2X4 (GAUZE/BANDAGES/DRESSINGS) IMPLANT
SUT MNCRL AB 4-0 PS2 18 (SUTURE) ×4 IMPLANT
SUT MON AB 4-0 PC3 18 (SUTURE) ×8 IMPLANT
SUT PROLENE 2 0 CT2 30 (SUTURE) IMPLANT
SUT PROLENE 2 0 SH DA (SUTURE) ×4 IMPLANT
SUT SILK 2 0 TIES 17X18 (SUTURE)
SUT SILK 2-0 18XBRD TIE BLK (SUTURE) IMPLANT
SUT VICRYL 3-0 CR8 SH (SUTURE) ×4 IMPLANT
SYR 5ML LUER SLIP (SYRINGE) ×4 IMPLANT
SYR CONTROL 10ML LL (SYRINGE) ×4 IMPLANT
TOWEL OR 17X24 6PK STRL BLUE (TOWEL DISPOSABLE) ×8 IMPLANT
TOWEL OR NON WOVEN STRL DISP B (DISPOSABLE) ×4 IMPLANT
TUBE CONNECTING 20'X1/4 (TUBING) ×1
TUBE CONNECTING 20X1/4 (TUBING) ×3 IMPLANT
YANKAUER SUCT BULB TIP NO VENT (SUCTIONS) ×4 IMPLANT

## 2015-11-03 NOTE — Progress Notes (Signed)
Assisted Dr. Crews with right, ultrasound guided, pectoralis block. Side rails up, monitors on throughout procedure. See vital signs in flow sheet. Tolerated Procedure well. 

## 2015-11-03 NOTE — Anesthesia Procedure Notes (Addendum)
Anesthesia Regional Block:  Pectoralis block  Pre-Anesthetic Checklist: ,, timeout performed, Correct Patient, Correct Site, Correct Laterality, Correct Procedure, Correct Position, site marked, Risks and benefits discussed,  Surgical consent,  Pre-op evaluation,  At surgeon's request and post-op pain management  Laterality: Right and Upper  Prep: chloraprep       Needles:  Injection technique: Single-shot  Needle Type: Echogenic Needle     Needle Length: 9cm 9 cm Needle Gauge: 21 and 21 G    Additional Needles:  Procedures: ultrasound guided (picture in chart) Pectoralis block Narrative:  Start time: 11/03/2015 10:24 AM End time: 11/03/2015 10:29 AM Injection made incrementally with aspirations every 5 mL.  Performed by: Personally  Anesthesiologist: CREWS, DAVID   Procedure Name: LMA Insertion Date/Time: 11/03/2015 12:30 PM Performed by: Marrianne Mood Pre-anesthesia Checklist: Patient identified, Emergency Drugs available, Suction available, Patient being monitored and Timeout performed Patient Re-evaluated:Patient Re-evaluated prior to inductionOxygen Delivery Method: Circle System Utilized Preoxygenation: Pre-oxygenation with 100% oxygen Intubation Type: IV induction Ventilation: Mask ventilation without difficulty LMA: LMA inserted LMA Size: 4.0 Number of attempts: 1 Airway Equipment and Method: Bite block Placement Confirmation: positive ETCO2 Tube secured with: Tape Dental Injury: Teeth and Oropharynx as per pre-operative assessment       Right PEC block image

## 2015-11-03 NOTE — Brief Op Note (Signed)
11/03/2015  1:30 PM  PATIENT:  Lori Greene  74 y.o. female  PRE-OPERATIVE DIAGNOSIS:  BREAST CANCER  POST-OPERATIVE DIAGNOSIS:  BREAST CANCER  PROCEDURE:  Procedure(s): RIGHT BREAST LUMPECTOMY WITH RADIOACTIVE SEED AND SENTINEL LYMPH NODE MAPPING (Right) INSERTION PORT-A-CATH (Right)  SURGEON:  Surgeon(s) and Role:    * Erroll Luna, MD - Primary      ANESTHESIA:   general  /  Local/  Pectoral block   EBL:  Total I/O In: 1000 [I.V.:1000] Out: -   BLOOD ADMINISTERED:none  DRAINS: none   LOCAL MEDICATIONS USED:  MARCAINE     SPECIMEN:  Source of Specimen:  breast  and axilla right   DISPOSITION OF SPECIMEN:  PATHOLOGY  COUNTS:  YES  TOURNIQUET:  * No tourniquets in log *  DICTATION: .Other Dictation: Dictation Number [id number]  PLAN OF CARE: Discharge to home after PACU  PATIENT DISPOSITION:  PACU - hemodynamically stable.   Delay start of Pharmacological VTE agent (>24hrs) due to surgical blood loss or risk of bleeding: not applicable

## 2015-11-03 NOTE — Transfer of Care (Signed)
Immediate Anesthesia Transfer of Care Note  Patient: Lori Greene  Procedure(s) Performed: Procedure(s): RIGHT BREAST LUMPECTOMY WITH RADIOACTIVE SEED AND SENTINEL LYMPH NODE MAPPING (Right) INSERTION PORT-A-CATH (Right)  Patient Location: PACU  Anesthesia Type:GA combined with regional for post-op pain  Level of Consciousness: awake and patient cooperative  Airway & Oxygen Therapy: Patient Spontanous Breathing and Patient connected to face mask oxygen  Post-op Assessment: Report given to RN and Post -op Vital signs reviewed and stable  Post vital signs: Reviewed and stable  Last Vitals:  Filed Vitals:   11/03/15 1145 11/03/15 1200  BP: 143/64 146/66  Pulse: 61 61  Temp:    Resp: 19 15    Complications: No apparent anesthesia complications

## 2015-11-03 NOTE — H&P (Signed)
H&P   Lori Greene (MR# [id number])      H&P Info    Author Note Status Last Update User Last Update Date/Time   Erroll Luna, MD Signed Erroll Luna, MD 09/29/2015 5:03 PM    H&P    Expand All Collapse All   Lori Greene 09/29/2015 2:20 PM Location: [facility] Patient #: [id number] DOB: May 03, 1941 Married / Language: English / Race: White Female  History of Present Illness Lori Moores A. Zaiden Ludlum MD; 09/29/2015 5:00 PM) Patient words: Patient sent at the request of Lori Greene for right breast cancer. She underwent screening mammography which showed 2 densities right breast upper inner quadrant 1:00 within a centimeter of each other. 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 Each measures approximately 1 cm. Denies any history of breast mass nipple discharge or change in the appearance of her breasts. She has no significant family history of breast cancer except for her mother in her 58s. She is sore from her biopsy.          ADDITIONAL INFORMATION: 1. PROGNOSTIC INDICATORS Results: IMMUNOHISTOCHEMICAL AND MORPHOMETRIC ANALYSIS PERFORMED MANUALLY Estrogen Receptor: 100%, POSITIVE, STRONG STAINING INTENSITY Progesterone Receptor: 10%, POSITIVE, MODERATE STAINING INTENSITY Proliferation Marker Ki67: 10% REFERENCE RANGE ESTROGEN RECEPTOR NEGATIVE 0% POSITIVE =>1% REFERENCE RANGE PROGESTERONE RECEPTOR NEGATIVE 0% POSITIVE =>1% All controls stained appropriately Lori Cutter MD Pathologist, Electronic Signature ( Signed 09/24/2015) 1. FLUORESCENCE IN-SITU HYBRIDIZATION Results: HER2 - **POSITIVE** RATIO OF HER2/CEP17 SIGNALS 4.75 AVERAGE HER2 COPY NUMBER PER CELL 12.35 Reference Range: NEGATIVE HER2/CEP17 Ratio <2.0 and average HER2 copy number <4.0 EQUIVOCAL HER2/CEP17 Ratio <2.0 and average HER2 copy number 4.0 and <6.0 1 of 4 FINAL for Lori Greene ([id number]) ADDITIONAL  INFORMATION:(continued) POSITIVE HER2/CEP17 Ratio >=2.0 or <2.0 and average HER2 copy number >=6.0 Lori Cutter MD Pathologist, Electronic Signature ( Signed 09/24/2015) 2. PROGNOSTIC INDICATORS Results: IMMUNOHISTOCHEMICAL AND MORPHOMETRIC ANALYSIS PERFORMED MANUALLY Estrogen Receptor: 100%, POSITIVE, STRONG STAINING INTENSITY Progesterone Receptor: 0%, NEGATIVE Proliferation Marker Ki67: 20% COMMENT: The negative hormone receptor study(ies) in this case has no internal positive control. REFERENCE RANGE ESTROGEN RECEPTOR NEGATIVE 0% POSITIVE =>1% REFERENCE RANGE PROGESTERONE RECEPTOR NEGATIVE 0% POSITIVE =>1% All controls stained appropriately Lori Cutter MD Pathologist, Electronic Signature ( Signed 09/24/2015) 2. FLUORESCENCE IN-SITU HYBRIDIZATION Results: HER2 - **POSITIVE** RATIO OF HER2/CEP17 SIGNALS 4.73 AVERAGE HER2 COPY NUMBER PER CELL 11.60 Reference Range: NEGATIVE HER2/CEP17 Ratio <2.0 and average HER2 copy number <4.0 EQUIVOCAL HER2/CEP17 Ratio <2.0 and average HER2 copy number 4.0 and <6.0 POSITIVE HER2/CEP17 Ratio >=2.0 or <2.0 and average HER2 copy number >=6.0 Lori Cutter MD Pathologist, Electronic Signature ( Signed 09/24/2015) 2 of 4 FINAL for Lori Greene ([id number]) FINAL DIAGNOSIS Diagnosis 1. Breast, right, needle core biopsy, 1:00 o'clock, 9 CMFN - INVASIVE DUCTAL CARCINOMA. - DUCTAL CARCINOMA IN SITU. - SEE COMMENT. 2. Breast, right, needle core biopsy, 1:00 o'clock, 10 CMFN - INVASIVE DUCTAL CARCINOMA. - DUCTAL CARCINOMA IN SITU WITH ASSOCIATED CALCIFICATIONS. - SEE COMMENT. Microscopic Comment 1. Although definitive grading of breast carcinoma is best done on excision, the features of the invasive tumor from the right 1 o'clock needle core biopsy 9 cm from the nipple are compatible with a grade 1 to 2 breast carcinoma. Breast prognostic markers will be performed and reported in an addendum. Findings are called to the Lori Greene on 09/22/2015. Dr. Donato Greene has seen the first specimen in consultation with agreement. 2. Although definitive grading of breast carcinoma is best  done on excision, the features of the invasive tumor from the right 1 o'clock biopsy 10 cm from the nipple are compatible with a grade 1 to 2 breast carcinoma. Breast prognostic markers will be performed and reported in an addendum. Findings are called to the Lori Greene on 09/22/2015. Dr. Donato Greene has seen the second specimen in consultation with agreement. (RH:ds 09/22/15) Lori Niece MD Pathologist, Electronic Signature (Case signed 09/22/2015) Specimen Gross and Clinical Information Specimen Comment 1. In formalin 2:30, extracted <5 mins; screening detected right breast masses 2. In formalin 2:50, extracted <5 mins Specimen(s) Obtained: 1. Breast, right, needle core biopsy, 1:00 o'clock, 9 CMFN 2. Breast, right, needle core biopsy, 1:00 o'clock, 10 CMFN Specimen Clinical Information 1. Suspect malignancy Gross 1. Received in formalin are 4 cores of soft, tan-red tissue which measure 1.4 x 0.2 x 0.2 cm and up to 1.6 x 0.2 x 0.2 cm. Submitted in toto in 1 block(s). Time in Formalin 2:30 pm (sk) 2. Received in formalin are 3 cores of soft, tan-red tissue which measure 1.4 x 0.3 x 0.2 cm and up to 2.4 x 0.3 x 0.2 cm. Submitted in toto in 1 block(s). Time in Formalin 2:50 pm (sk) Stain(s) used in Diagnosis: The following stain(s) were used in diagnosing the case: KI-67-ACIS, PR-ACIS, Her2 FISH, ER-ACIS. The control(s) stained appropriately. Disclaimer PR progesterone receptor (PgR 636), immunohistochemical stains are performed on formalin fixed, paraffin embedded tissue using a 3,3"-diaminobenzidine (DAB) chromogen and [employer] 3 of 4 FINAL for Lori Greene ([id number]) Disclaimer(continued) System. The staining intensity of the nucleus is scored morphometrically using the Automated Cellular Imaging System  (ACIS) and is reported as the percentage of tumor cell nuclei demonstrating specific nuclear staining. HER2 IQFISH pharmDX (code [id number]) is a direct fluorescence in-situ hybridization assay designed to quantitatively determine HER2 gene amplification in formalin-fixed, paraffin-embedded tissue specimens. It is performed at [facility] and is reported using ASCO/CAP scoring criteria published in 2013. Ki-67 (Mib-1), immunohistochemical stains are performed on formalin fixed, paraffin embedded tissue using a 3,3"-diaminobenzidine (DAB) chromogen and [place]. The staining intensity of the nucleus is scored morphometrically using the Automated Cellular Imaging System (ACIS) and is reported as the percentage of tumor cell nuclei demonstrating specific nuclear staining. Estrogen receptor (SP1), immunohistochemical stains are performed on formalin fixed, paraffin embedded tissue using a 3,3"-diaminobenzidine (DAB) chromogen and [employer]ainer System. The staining intensity of the nucleus is scored morphometrically using the Automated Cellular Imaging System (ACIS) and is reported as the percentage of tumor cell nuclei demonstrating specific nuclear staining. Report signed out from the following location(s) Technical Component was performed at [facility]. [address],[place] [address].OEUM:[id number]., Technical component and interpretation was performed at [facility], [place], [place] [address]. CLIA #: [id number],      CLINICAL DATA: Patient returns after screening study for evaluation of possible right breast mass.  EXAM: DIGITAL DIAGNOSTIC RIGHT MAMMOGRAM WITH 3D TOMOSYNTHESIS WITH CAD  ULTRASOUND RIGHT BREAST  COMPARISON: 09/04/2015 and earlier  ACR Breast Density Category c: The breast tissue is heterogeneously dense, which may obscure small masses.  FINDINGS: Additional tomosynthesis  images are performed, confirming presence of 2 spiculated masses in the upper central portion of the right breast.  Mammographic images were processed with CAD.  On physical exam, I palpate no abnormality in the upper central portion of the right breast.  Targeted ultrasound is performed, showing a small hypoechoic irregular mass with hyperechoic rim in  the 1 o'clock location of the right breast 10 cm from the nipple. Mass measures 0.7 x 0.5 x 0.6 cm. A second mass is identified in the 1 o'clock location 9 cm from the nipple which measures 0.7 x 0.7 x 0.6 cm. The 2 masses are 2.4 cm apart. Evaluation of the right axilla is negative for adenopathy.  IMPRESSION: 1. Two suspicious masses in the 1 o'clock location of the right breast warranting tissue diagnosis. 2. No evidence for adenopathy.  RECOMMENDATION: Ultrasound-guided core biopsies are recommended and scheduled for the patient on 09/21/2015 at 2 o'clock p.m.  I have discussed the findings and recommendations with the patient. Results were also provided in writing at the conclusion of the.  The patient is a 74 year old female.   Other Problems Elbert Ewings, CMA; 09/29/2015 2:21 PM) Asthma Breast Cancer Gastroesophageal Reflux Disease High blood pressure Hypercholesterolemia  Past Surgical History Elbert Ewings, CMA; 09/29/2015 2:21 PM) Breast Biopsy Right. Hysterectomy (not due to cancer) - Partial  Diagnostic Studies History Elbert Ewings, CMA; 09/29/2015 2:21 PM) Colonoscopy never Mammogram within last year Pap Smear >5 years ago  Allergies Elbert Ewings, CMA; 09/29/2015 2:21 PM) Penicillin V *PENICILLINS*  Medication History Elbert Ewings, CMA; 09/29/2015 2:24 PM) ProAir HFA (108 (90 Base)MCG/ACT Aerosol Soln, Inhalation) Active. Omeprazole (40MG Capsule DR, Oral) Active. Calcium (500MG Tablet, Oral) Active. Vitamin D3 (1000UNIT Tablet, Oral) Active. Multiple Vitamin (Oral) Active. MetFORMIN  HCl (500MG Tablet, Oral) Active. Losartan Potassium (25MG Tablet, Oral) Active. Montelukast Sodium (10MG Tablet, Oral) Active. Cinnamon Plus Chromium (100-500MCG-MG Capsule, Oral) Active. Garlic Oil (0814GY Capsule, Oral) Active. Medications Reconciled  Social History Elbert Ewings, [place]; 09/29/2015 2:21 PM) Caffeine use Tea. No alcohol use No drug use Tobacco use Never smoker.  Family History Elbert Ewings, [place]; 09/29/2015 2:21 PM) Breast Cancer Mother. Hypertension Mother.  Pregnancy / Birth History Elbert Ewings, CMA; 09/29/2015 2:21 PM) Age at menarche 40 years. Gravida 0 Para 0     Review of Systems Elbert Ewings CMA; 09/29/2015 2:21 PM) General Present- Fatigue. Not Present- Appetite Loss, Chills, Fever, Night Sweats, Weight Gain and Weight Loss. Skin Not Present- Change in Wart/Mole, Dryness, Hives, Jaundice, New Lesions, Non-Healing Wounds, Rash and Ulcer. HEENT Present- Hoarseness and Wears glasses/contact lenses. Not Present- Earache, Hearing Loss, Nose Bleed, Oral Ulcers, Ringing in the Ears, Seasonal Allergies, Sinus Pain, Sore Throat, Visual Disturbances and Yellow Eyes. Respiratory Present- Chronic Cough and Snoring. Not Present- Bloody sputum, Difficulty Breathing and Wheezing. Breast Not Present- Breast Mass, Breast Pain, Nipple Discharge and Skin Changes. Cardiovascular Not Present- Chest Pain, Difficulty Breathing Lying Down, Leg Cramps, Palpitations, Rapid Heart Rate, Shortness of Breath and Swelling of Extremities. Gastrointestinal Not Present- Abdominal Pain, Bloating, Bloody Stool, Change in Bowel Habits, Chronic diarrhea, Constipation, Difficulty Swallowing, Excessive gas, Gets full quickly at meals, Hemorrhoids, Indigestion, Nausea, Rectal Pain and Vomiting. Female Genitourinary Not Present- Frequency, Nocturia, Painful Urination, Pelvic Pain and Urgency. Musculoskeletal Not Present- Back Pain, Joint Pain, Joint Stiffness, Muscle Pain, Muscle  Weakness and Swelling of Extremities. Psychiatric Not Present- Anxiety, Bipolar, Change in Sleep Pattern, Depression, Fearful and Frequent crying. Endocrine Present- Hair Changes. Not Present- Cold Intolerance, Excessive Hunger, Heat Intolerance, Hot flashes and New Diabetes. Hematology Present- Easy Bruising. Not Present- Excessive bleeding, Gland problems, HIV and Persistent Infections.  Vitals Elbert Ewings CMA; 09/29/2015 2:24 PM) 09/29/2015 2:24 PM Weight: 116.8 lb Height: 60in Body Surface Area: 1.49 m Body Mass Index: 22.81 kg/m  Temp.: 98.52F(Temporal)  Pulse: 81 (Regular)  BP: 130/64 (Sitting,  Left Arm, Standard)      Physical Exam (Miriam Liles A. Raquel Racey MD; 09/29/2015 5:01 PM)  General Mental Status-Alert. General Appearance-Consistent with stated age. Hydration-Well hydrated. Voice-Normal.  Head and Neck Head-normocephalic, atraumatic with no lesions or palpable masses. Trachea-midline. Thyroid Gland Characteristics - normal size and consistency.  Chest and Lung Exam Chest and lung exam reveals -quiet, even and easy respiratory effort with no use of accessory muscles and on auscultation, normal breath sounds, no adventitious sounds and normal vocal resonance. Inspection Chest Wall - Normal. Back - normal.  Breast Note: Bruising right breast upper inner quadrant with small hematoma. Nipple normal right breast. Left breast normal without mass lesion nipple discharge.  Cardiovascular Cardiovascular examination reveals -normal heart sounds, regular rate and rhythm with no murmurs and normal pedal pulses bilaterally.  Neurologic Neurologic evaluation reveals -alert and oriented x 3 with no impairment of recent or remote memory. Mental Status-Normal.  Musculoskeletal Normal Exam - Left-Upper Extremity Strength Normal and Lower Extremity Strength Normal. Normal Exam - Right-Upper Extremity Strength Normal and Lower Extremity  Strength Normal.  Lymphatic Head & Neck  General Head & Neck Lymphatics: Bilateral - Description - Normal. Axillary  General Axillary Region: Bilateral - Description - Normal. Tenderness - Non Tender.    Assessment & Plan (Mackenze Grandison A. 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 She is a good lumpectomy candidate without neoadjuvant chemotherapy but may require a Port-A-Cath for postoperative care. Discussed mastectomy and reconstruction as well. She is very interested in proceeding with right breast lumpectomy and sentinel lymph node mapping. We'll await consultant's input and then schedule surgery after.  Current Plans Referred to Oncology, for evaluation and follow up (Oncology). Routine. Referred to Radiation Oncology, for evaluation and follow up (Radiation Oncology). Routine. Referred to Physical Therapy, for evaluation and follow up (Physical Therapy). Routine. You are being scheduled for surgery - Our schedulers will call you.  You should hear from our office's scheduling department within 5 working days about the location, date, and time of surgery. We try to make accommodations for patient's preferences in scheduling surgery, but sometimes the OR schedule or the surgeon's schedule prevents [place] from making those accommodations.  If you have not heard from our office [phone]) in 5 working days, call the office and ask for your surgeon's nurse.  If you have other questions about your diagnosis, plan, or surgery, call the office and ask for your surgeon's nurse.  Pt Education - [facility] Breast Cancer Information Given - [employer] "Breast Journey" Package We discussed the staging and pathophysiology of breast cancer. We discussed all of the different options for treatment for breast cancer including surgery,  chemotherapy, radiation therapy, Herceptin, and antiestrogen therapy. We discussed a sentinel lymph node biopsy as she does not appear to having lymph node involvement right now. We discussed the performance of that with injection of radioactive tracer and blue dye. We discussed that she would have an incision underneath her axillary hairline. We discussed that there is a bout a 10-20% chance of having a positive node with a sentinel lymph node biopsy and we will await the permanent pathology to make any other first further decisions in terms of her treatment. One of these options might be to return to the operating room to perform an axillary lymph node dissection. We discussed about a 1-2% risk lifetime of chronic shoulder  pain as well as lymphedema associated with a sentinel lymph node biopsy. We discussed the options for treatment of the breast cancer which included lumpectomy versus a mastectomy. We discussed the performance of the lumpectomy with a wire placement. We discussed a 10-20% chance of a positive margin requiring reexcision in the operating room. We also discussed that she may need radiation therapy or antiestrogen therapy or both if she undergoes lumpectomy. We discussed the mastectomy and the postoperative care for that as well. We discussed that there is no difference in her survival whether she undergoes lumpectomy with radiation therapy or antiestrogen therapy versus a mastectomy. There is a slight difference in the local recurrence rate being 3-5% with lumpectomy and about 1% with a mastectomy. We discussed the risks of operation including bleeding, infection, possible reoperation. She understands her further therapy will be based on what her stages at the time of her operation.  Pt Education - flb breast cancer surgery: discussed with patient and provided information. Pt Education - [facility] Breast Biopsy HCI: discussed with patient and provided information. Pt Education - ABC (After Breast  Cancer) Class Info: discussed with patient and provided information.

## 2015-11-03 NOTE — Discharge Instructions (Signed)
[facility] °Office Phone Number [phone] ° °BREAST BIOPSY/ PARTIAL MASTECTOMY: POST OP INSTRUCTIONS ° °Always review your discharge instruction sheet given to you by the facility where your surgery was performed. ° °IF YOU HAVE DISABILITY OR FAMILY LEAVE FORMS, YOU MUST BRING THEM TO THE OFFICE FOR PROCESSING.  DO NOT GIVE THEM TO YOUR DOCTOR. ° °1. A prescription for pain medication may be given to you upon discharge.  Take your pain medication as prescribed, if needed.  If narcotic pain medicine is not needed, then you may take acetaminophen (Tylenol) or ibuprofen (Advil) as needed. °2. Take your usually prescribed medications unless otherwise directed °3. If you need a refill on your pain medication, please contact your pharmacy.  They will contact our office to request authorization.  Prescriptions will not be filled after 5pm or on week-ends. °4. You should eat very light the first 24 hours after surgery, such as soup, crackers, pudding, etc.  Resume your normal diet the day after surgery. °5. Most patients will experience some swelling and bruising in the breast.  Ice packs and a good support bra will help.  Swelling and bruising can take several days to resolve.  °6. It is common to experience some constipation if taking pain medication after surgery.  Increasing fluid intake and taking a stool softener will usually help or prevent this problem from occurring.  A mild laxative (Milk of Magnesia or Miralax) should be taken according to package directions if there are no bowel movements after 48 hours. °7. Unless discharge instructions indicate otherwise, you may remove your bandages 24-48 hours after surgery, and you may shower at that time.  You may have steri-strips (small skin tapes) in place directly over the incision.  These strips should be left on the skin for 7-10 days.  If your surgeon used skin glue on the incision, you may shower in 24 hours.  The glue will flake off over the  next 2-3 weeks.  Any sutures or staples will be removed at the office during your follow-up visit. °8. ACTIVITIES:  You may resume regular daily activities (gradually increasing) beginning the next day.  Wearing a good support bra or sports bra minimizes pain and swelling.  You may have sexual intercourse when it is comfortable. °a. You may drive when you no longer are taking prescription pain medication, you can comfortably wear a seatbelt, and you can safely maneuver your car and apply brakes. °b. RETURN TO WORK:  ______________________________________________________________________________________ °9. You should see your doctor in the office for a follow-up appointment approximately two weeks after your surgery.  Your doctor’s nurse will typically make your follow-up appointment when she calls you with your pathology report.  Expect your pathology report 2-3 business days after your surgery.  You may call to check if you do not hear from us after three days. °10. OTHER INSTRUCTIONS: _______________________________________________________________________________________________ _____________________________________________________________________________________________________________________________________ °_____________________________________________________________________________________________________________________________________ °_____________________________________________________________________________________________________________________________________ ° °WHEN TO CALL YOUR DOCTOR: °1. Fever over 101.0 °2. Nausea and/or vomiting. °3. Extreme swelling or bruising. °4. Continued bleeding from incision. °5. Increased pain, redness, or drainage from the incision. ° °The clinic staff is available to answer your questions during regular business hours.  Please don’t hesitate to call and ask to speak to one of the nurses for clinical concerns.  If you have a medical emergency, go to the nearest  emergency room or call 911.  A surgeon from [facility] is always on call at the hospital. ° °For further questions, please visit [email or web address]  ° ° ° °  Post Anesthesia Home Care Instructions ° °Activity: °Get plenty of rest for the remainder of the day. A responsible adult should stay with you for 24 hours following the procedure.  °For the next 24 hours, DO NOT: °-Drive a car °-Operate machinery °-Drink alcoholic beverages °-Take any medication unless instructed by your physician °-Make any legal decisions or sign important papers. ° °Meals: °Start with liquid foods such as gelatin or soup. Progress to regular foods as tolerated. Avoid greasy, spicy, heavy foods. If nausea and/or vomiting occur, drink only clear liquids until the nausea and/or vomiting subsides. Call your physician if vomiting continues. ° °Special Instructions/Symptoms: °Your throat may feel dry or sore from the anesthesia or the breathing tube placed in your throat during surgery. If this causes discomfort, gargle with warm salt water. The discomfort should disappear within 24 hours. ° °If you had a scopolamine patch placed behind your ear for the management of post- operative nausea and/or vomiting: ° °1. The medication in the patch is effective for 72 hours, after which it should be removed.  Wrap patch in a tissue and discard in the trash. Wash hands thoroughly with soap and water. °2. You may remove the patch earlier than 72 hours if you experience unpleasant side effects which may include dry mouth, dizziness or visual disturbances. °3. Avoid touching the patch. Wash your hands with soap and water after contact with the patch. °  ° °

## 2015-11-03 NOTE — Interval H&P Note (Signed)
History and Physical Interval Note:  11/03/2015 10:39 AM  Lori Greene  has presented today for surgery, with the diagnosis of BREAST CANCER  The various methods of treatment have been discussed with the patient and family. After consideration of risks, benefits and other options for treatment, the patient has consented to  Procedure(s): RIGHT BREAST LUMPECTOMY WITH RADIOACTIVE SEED AND SENTINEL LYMPH NODE MAPPING (Right) INSERTION PORT-A-CATH (N/A) as a surgical intervention .  The patient's history has been reviewed, patient examined, no change in status, stable for surgery.  I have reviewed the patient's chart and labs.  Questions were answered to the patient's satisfaction.     Pt her 2 neu positive and will need post operative Herceptin per Dr Whitney Muse.   Risk of bleeding infection  Collapse lung bleeding around the lung  Cardiac   Injury  Catheter migration  CVA  MI.  Understands the need for access as well and alternatives.    [name] A.

## 2015-11-03 NOTE — Progress Notes (Signed)
Radiology staff performed nuc med inj. No additional sedation required. Pt tol well. See VS flowsheet

## 2015-11-03 NOTE — Anesthesia Postprocedure Evaluation (Signed)
Anesthesia Post Note  Patient: Lori Greene  Procedure(s) Performed: Procedure(s) (LRB): RIGHT BREAST LUMPECTOMY WITH RADIOACTIVE SEED AND SENTINEL LYMPH NODE MAPPING (Right) INSERTION PORT-A-CATH (Right)  Patient location during evaluation: PACU Anesthesia Type: General Level of consciousness: awake and alert Pain management: pain level controlled Vital Signs Assessment: post-procedure vital signs reviewed and stable Respiratory status: spontaneous breathing, nonlabored ventilation, respiratory function stable and patient connected to nasal cannula oxygen Cardiovascular status: blood pressure returned to baseline and stable Postop Assessment: no signs of nausea or vomiting Anesthetic complications: no    Last Vitals:  Filed Vitals:   11/03/15 1400 11/03/15 1415  BP: 170/82 166/84  Pulse: 76 73  Temp:    Resp: 18 16    Last Pain: There were no vitals filed for this visit.               Matin Mattioli A

## 2015-11-03 NOTE — Anesthesia Preprocedure Evaluation (Signed)
Anesthesia Evaluation  Patient identified by MRN, date of birth, ID band Patient awake    Reviewed: Allergy & Precautions, NPO status , Patient's Chart, lab work & pertinent test results  Airway Mallampati: II  TM Distance: >3 FB Neck ROM: Full    Dental  (+) Teeth Intact, Dental Advisory Given   Pulmonary asthma (Bronchiectasis) ,    breath sounds clear to auscultation       Cardiovascular hypertension, Pt. on medications  Rhythm:Regular Rate:Normal     Neuro/Psych    GI/Hepatic   Endo/Other  diabetes  Renal/GU      Musculoskeletal   Abdominal   Peds  Hematology   Anesthesia Other Findings   Reproductive/Obstetrics                             Anesthesia Physical Anesthesia Plan  ASA: II  Anesthesia Plan: General   Post-op Pain Management: MAC Combined w/ Regional for Post-op pain   Induction: Intravenous  Airway Management Planned: LMA  Additional Equipment:   Intra-op Plan:   Post-operative Plan: Extubation in OR  Informed Consent: I have reviewed the patients History and Physical, chart, labs and discussed the procedure including the risks, benefits and alternatives for the proposed anesthesia with the patient or authorized representative who has indicated his/her understanding and acceptance.   Dental advisory given  Plan Discussed with: CRNA, Anesthesiologist and Surgeon  Anesthesia Plan Comments:         Anesthesia Quick Evaluation

## 2015-11-04 ENCOUNTER — Encounter (HOSPITAL_BASED_OUTPATIENT_CLINIC_OR_DEPARTMENT_OTHER): Payer: Self-pay | Admitting: Surgery

## 2015-11-04 NOTE — Op Note (Signed)
NAMEALEXINE, PILANT NO.:  [id number]  MEDICAL RECORD NO.:  [id number]  LOCATION:                               FACILITY:  [facility]  PHYSICIAN:  Marcello Moores A. Augustino Savastano, M.D.DATE OF BIRTH:  [birth year]  DATE OF PROCEDURE:  11/03/2015 DATE OF DISCHARGE:  11/03/2015                              OPERATIVE REPORT   PREOPERATIVE DIAGNOSIS:  Stage I right breast cancer.  POSTOPERATIVE DIAGNOSIS:  Stage I right breast cancer.  PROCEDURE: 1. Right breast seed localized partial mastectomy. 2. Right axillary sentinel lymph node mapping. 3. Placement of right 8-French internal jugular Port-A-Cath with     fluoroscopic and ultrasound guidance.  SURGEON:  Marcello Moores A. Talea Manges, M.D.  ANESTHESIA:  LMA with 0.25% Sensorcaine local with epinephrine and pectoral block.  EBL:  Minimal.  SPECIMEN: 1. Right breast mass with clip and seed in specimen. 2. Right axilla sentinel node hot x1.  INDICATIONS FOR PROCEDURE:  The patient is a 74 year old female with right breast cancer.  She requires postoperative chemotherapy.  She was seen by Medical Oncology preoperatively.  She opted for breast conservation.  We discussed breast conservation with her to include lumpectomy, sentinel lymph node mapping since she is HER-2/neu positive, and place a Port-A-Cath for postop chemotherapy.  Risk of bleeding, infection, nerve injury, blood vessel injury, catheter migration, collapsed lung, bleeding around the lung, bleeding around the heart, mediastinal injury, death, DVT, pulmonary issues, the need for further surgery to treat the above-mentioned complications discussed.  She voiced understanding of the need for the Port-A-Cath as well as breast surgery and treatment after that.  DESCRIPTION OF PROCEDURE:  The patient was met in the holding area.  The right breast was marked as correct side.  Neoprobe was used to verify the location of the seed.  She underwent injection of the right breast for  sentinel lymph node mapping.  Questions were answered.  She was taken back to the operating room and placed supine on the OR table. After induction of general anesthesia, right breast and right arm and upper neck and chest regions were prepped and draped in sterile fashion. Time-out was done.  Ultrasound was used to locate the right internal jugular vein.  Needle was used to access the vein under ultrasound guidance.  Wire was fed through this with return of dark nonpulsatile blood.  The wire fed down into the superior vena cava into the right atrium.  She was flattened up.  A 2 cm incision was made just at the level of the right clavicle.  Small pocket was made for an 8-French Port- A-Cath.  This was tunneled from the lower incision to the upper incision.  It was cut to 20 cm.  It was attached to the port and flushed.  With the patient Trendelenburg, the dilator introducer was passed over the wire moving the wire to and fro without resistance. This went in easily.  The catheter was then placed through the peel-away sheath it was pealed away.  C-arm revealed the tip to be at the junction of the cavoatrial junction.  There was no evidence of pneumothorax or twisting of the catheter.  It drew back  dark nonpulsatile blood.  It was flushed easily.  It was secured to the chest wall with 2-0 nylon. Incisions were closed with 3-0 Vicryl and 4-0 Monocryl.  The lumpectomy was done next in the right.  Neoprobe was used to identify the seed in the central to upper part of the right breast. Transverse incision made over the hot spot.  All tissue around the seeding clip as excised and radiograph revealed the seed and clip to be present.  Cavity was irrigated and found to be hemostatic.  Clips were placed.  This was closed with 3-0 Vicryl and 4-0 Monocryl.  The Neoprobe was then changed to technetium settings.  The right axilla was interrogated and hotspot was identified.  A 3 cm incision was  made around the right axilla, dissection was carried down, 1 hot sentinel node was identified.  The background counts approached 0.  This was sent to pathology.  Hemostasis was achieved and clips were placed to a small bleeding vessel in the right axilla.  This was quite superficial. Irrigation was used.  Hemostasis achieved.  The axillary vein, long thoracic nerve, and thoracodorsal trunks were preserved.  This wound was closed with 3-0 Vicryl and 4-0 Monocryl.  Liquid adhesive applied to all incisions.  All final counts were found to be correct.  The patient tolerated the procedure well.  She was extubated and taken to recovery in satisfactory condition.     Jasraj Lappe A. Aneesah Hernan, M.D.     TAC/MEDQ  D:  11/03/2015  T:  11/04/2015  Job:  [id number]

## 2015-11-10 ENCOUNTER — Ambulatory Visit (HOSPITAL_COMMUNITY): Payer: Medicare Other | Admitting: Hematology & Oncology

## 2015-11-17 ENCOUNTER — Encounter (HOSPITAL_COMMUNITY): Payer: Medicare Other | Attending: Hematology & Oncology | Admitting: Hematology & Oncology

## 2015-11-17 ENCOUNTER — Encounter (HOSPITAL_COMMUNITY): Payer: Self-pay | Admitting: Hematology & Oncology

## 2015-11-17 VITALS — BP 184/74 | HR 77 | Temp 98.2°F | Resp 20 | Wt 116.9 lb

## 2015-11-17 DIAGNOSIS — C50211 Malignant neoplasm of upper-inner quadrant of right female breast: Secondary | ICD-10-CM | POA: Diagnosis not present

## 2015-11-17 DIAGNOSIS — Z170 Estrogen receptor positive status [ER+]: Secondary | ICD-10-CM

## 2015-11-17 NOTE — Patient Instructions (Addendum)
Lori Greene at [facility] Discharge Instructions  RECOMMENDATIONS MADE BY THE CONSULTANT AND ANY TEST RESULTS WILL BE SENT TO YOUR REFERRING PHYSICIAN.  We will be doing Herceptin for at least one year.  This is an infusion that will be done at our clinic and is to try to prevent recurrence.    Dr. Dotty Gonzalo Muse will discuss further treatment with you to include chemotherapy. We will not be doing scans at this time because of the risk of the radiation, should anything change we will order scans as appropriate.   Be careful with your arm on the side that had the lymph nodes removed.  This increases your risk for swelling, fluid retention, and infection.   Please call us with any further questions or concerns.    Trastuzumab injection for infusion  What is this medicine? TRASTUZUMAB (tras TOO zoo mab) is a monoclonal antibody. It is used to treat breast cancer and stomach cancer. This medicine may be used for other purposes; ask your health care provider or pharmacist if you have questions.  What should I tell my health care provider before I take this medicine? They need to know if you have any of these conditions: -heart disease -heart failure -infection (especially a virus infection such as chickenpox, cold sores, or herpes) -lung or breathing disease, like asthma -recent or ongoing radiation therapy -an unusual or allergic reaction to trastuzumab, benzyl alcohol, or other medications, foods, dyes, or preservatives -pregnant or trying to get pregnant -breast-feeding  How should I use this medicine? This drug is given as an infusion into a vein. It is administered in a hospital or clinic by a specially trained health care professional. Talk to your pediatrician regarding the use of this medicine in children. This medicine is not approved for use in children. Overdosage: If you think you have taken too much of this medicine contact a poison control center or emergency  room at once. NOTE: This medicine is only for you. Do not share this medicine with others.  What if I miss a dose? It is important not to miss a dose. Call your doctor or health care professional if you are unable to keep an appointment.  What may interact with this medicine? -doxorubicin -warfarin This list may not describe all possible interactions. Give your health care provider a list of all the medicines, herbs, non-prescription drugs, or dietary supplements you use. Also tell them if you smoke, drink alcohol, or use illegal drugs. Some items may interact with your medicine.  What should I watch for while using this medicine? Visit your doctor for checks on your progress. Report any side effects. Continue your course of treatment even though you feel ill unless your doctor tells you to stop. Call your doctor or health care professional for advice if you get a fever, chills or sore throat, or other symptoms of a cold or flu. Do not treat yourself. Try to avoid being around people who are sick. You may experience fever, chills and shaking during your first infusion. These effects are usually mild and can be treated with other medicines. Report any side effects during the infusion to your health care professional. Fever and chills usually do not happen with later infusions. Do not become pregnant while taking this medicine or for 7 months after stopping it. Women should inform their doctor if they wish to become pregnant or think they might be pregnant. Women of child-bearing potential will need to have a negative pregnancy  test before starting this medicine. There is a potential for serious side effects to an unborn child. Talk to your health care professional or pharmacist for more information. Do not breast-feed an infant while taking this medicine or for 7 months after stopping it. Women must use effective birth control with this medicine.  What side effects may I notice from receiving this  medicine? Side effects that you should report to your doctor or other health care professional as soon as possible: -breathing difficulties -chest pain or palpitations -cough -dizziness or fainting -fever or chills, sore throat -skin rash, itching or hives -swelling of the legs or ankles -unusually weak or tired Side effects that usually do not require medical attention (report to your doctor or other health care professional if they continue or are bothersome): -loss of appetite -headache -muscle aches -nausea This list may not describe all possible side effects. Call your doctor for medical advice about side effects. You may report side effects to FDA at 1-800-FDA-1088.  Where should I keep my medicine? This drug is given in a hospital or clinic and will not be stored at home. NOTE: This sheet is a summary. It may not cover all possible information. If you have questions about this medicine, talk to your doctor, pharmacist, or health care provider.    2016, Elsevier/Gold Standard. (2015-02-06 11:49:32)   Thank you for choosing [place] [facility] to provide your oncology and hematology care.  To afford each patient quality time with our provider, please arrive at least 15 minutes before your scheduled appointment time.    You need to re-schedule your appointment should you arrive 10 or more minutes late.  We strive to give you quality time with our providers, and arriving late affects you and other patients whose appointments are after yours.  Also, if you no show three or more times for appointments you may be dismissed from the clinic at the providers discretion.     Again, thank you for choosing [facility].  Our hope is that these requests will decrease the amount of time that you wait before being seen by our physicians.       _____________________________________________________________  Should you have questions after your visit to  [facility], please contact our office at [phone] between the hours of 8:30 a.m. and 4:30 p.m.  Voicemails left after 4:30 p.m. will not be returned until the following business day.  For prescription refill requests, have your pharmacy contact our office.

## 2015-11-17 NOTE — Progress Notes (Signed)
[place] at [facility] Progress Note  Patient Care Team: Lajean Manes, MD as PCP - General  CHIEF COMPLAINTS:  Invasive ductal carcinoma triple positive  ER+, PR+, HER 2 neu +  RIGHT breast cancer, upper inner quadrant Screening mammogram on 09/04/2015 with possible R breast mass 2 densities noted in the R breast upper inner quadrant within a centimeter of each other (9 and 10 cm from the nipple) 11/03/2015 partial mastectomy, sentinel node biopsy and port placement with Dr. Brantley Stage Final pathology [id number] ER+PR+ Her 2 neu+ R breast carcinoma  HISTORY OF PRESENTING ILLNESS:  Lori Greene 75 y.o. female is here for further follow-up of her R breast cancer.  Lori Greene is here with her husband and friend. Since our last visit, Lori Greene has undergone surgery.   I have personally discussed treatment options at length with the patient. She did undergo port placement as she notes she is willing do consider herceptin therapy. She is still hesitant about "chemotherapy."   Lori Greene is very concerned about permanent hair loss from using taxotere. The patient was curious about how we would determine that treatment has been effective. She admits that she has been experiencing anxiety related to starting treatment. She has multiple questions today.  She is healing well from surgery. Denies pain or difficulty sleeping. Appetite is good.   MEDICAL HISTORY:  Past Medical History  Diagnosis Date  . Hypertension   . Diabetes mellitus without complication ([place])   . Asthma   . GERD (gastroesophageal reflux disease)   . Cancer [facility]) 2016    right breast    SURGICAL HISTORY: Past Surgical History  Procedure Laterality Date  . Abdominal hysterectomy  1983  . Nasal fracture surgery    . Breast lumpectomy with radioactive seed and sentinel lymph node biopsy Right 11/03/2015    Procedure: RIGHT BREAST LUMPECTOMY WITH RADIOACTIVE SEED AND SENTINEL LYMPH NODE MAPPING;   Surgeon: Erroll Luna, MD;  Location: [facility];  Service: General;  Laterality: Right;  . Portacath placement Right 11/03/2015    Procedure: INSERTION PORT-A-CATH;  Surgeon: Erroll Luna, MD;  Location: [facility];  Service: General;  Laterality: Right;    SOCIAL HISTORY: Social History   Social History  . Marital Status: Married    Spouse Name: N/A  . Number of Children: N/A  . Years of Education: N/A   Occupational History  . retired    Social History Main Topics  . Smoking status: Never Smoker   . Smokeless tobacco: Never Used  . Alcohol Use: No  . Drug Use: No  . Sexual Activity: No   Other Topics Concern  . Not on file   Social History Narrative  Married 51 years. 0 children. Fostered children. 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 She likes to sew, knit, and quilt. She likes to walk but does not do it often. Non smoker ETOH, none She is from [facility] [place] and moved to [place] when she got married.  FAMILY HISTORY: Family History  Problem Relation Age of Onset  . Breast cancer Mother   . Leukemia Father    has no family status information on file.  Father died at 65 yo of leukemia Mother died of old age; breast cancer diagnosis at 74 yo. She had a biopsy, no treatment. 1 sister, healthy. No breast cancer.  ALLERGIES:  is allergic to losartan and penicillins.  MEDICATIONS:  Current Outpatient Prescriptions  Medication  Sig Dispense Refill  . albuterol (PROAIR HFA) 108 (90 BASE) MCG/ACT inhaler Inhale 2 puffs into the lungs every 6 (six) hours as needed for wheezing or shortness of breath.    Marland Kitchen atorvastatin (LIPITOR) 10 MG tablet Take 10 mg by mouth daily.    . budesonide-formoterol (SYMBICORT) 80-4.5 MCG/ACT inhaler Inhale 2 puffs into the lungs 2 (two) times daily.    . Calcium Carbonate-Vitamin D (CALCIUM + D PO) Take 1 tablet by mouth every morning.    . Cholecalciferol (VITAMIN D3) 2000  units TABS Take by mouth.    . dextromethorphan-guaiFENesin (MUCINEX DM) 30-600 MG 12hr tablet Take 1 tablet by mouth 2 (two) times daily.    . famotidine (PEPCID) 20 MG tablet Take 20 mg by mouth 2 (two) times daily.    Marland Kitchen lisinopril (PRINIVIL,ZESTRIL) 5 MG tablet Take 5 mg by mouth daily.    . metFORMIN (GLUCOPHAGE) 500 MG tablet Take 500 mg by mouth daily with breakfast.     . montelukast (SINGULAIR) 10 MG tablet     . omeprazole (PRILOSEC) 40 MG capsule Take 40 mg by mouth daily.     No current facility-administered medications for this visit.    Review of Systems  Constitutional: Negative.  Negative for fever, chills, weight loss and malaise/fatigue.  HENT: Negative.  Negative for congestion, hearing loss, nosebleeds, sore throat and tinnitus.   Eyes: Negative.  Negative for blurred vision, double vision, pain and discharge.  Respiratory: Negative.  Negative for cough, hemoptysis, sputum production, shortness of breath and wheezing.   Cardiovascular: Negative.  Negative for chest pain, palpitations, claudication, leg swelling and PND.  Gastrointestinal: Negative.  Negative for heartburn, nausea, vomiting, abdominal pain, diarrhea, constipation, blood in stool and melena.  Genitourinary: Negative.  Negative for dysuria, urgency, frequency and hematuria.  Musculoskeletal: Negative.  Negative for myalgias, joint pain and falls.  Skin: Negative.  Negative for itching and rash.  Neurological: Negative.  Negative for dizziness, tingling, tremors, sensory change, speech change, focal weakness, seizures, loss of consciousness, weakness and headaches.  Endo/Heme/Allergies: Negative.  Does not bruise/bleed easily.  Psychiatric/Behavioral: Negative for depression, suicidal ideas, memory loss and substance abuse. The patient is nervous/anxious. The patient does not have insomnia.        Anxiety related to beginning treatment.  All other systems reviewed and are negative.  14 point ROS was done and  is otherwise as detailed above or in HPI  PHYSICAL EXAMINATION: ECOG PERFORMANCE STATUS: 0 - Asymptomatic Filed Vitals:   11/17/15 1300  BP: 184/74  Pulse: 77  Temp: 98.2 F (36.8 C)  Resp: 20   Filed Weights   11/17/15 1300  Weight: 116 lb 14.4 oz (53.025 kg)     Physical Exam  Constitutional: She is oriented to person, place, and time and well-developed, well-nourished, and in no distress.  HENT:  Head: Normocephalic and atraumatic.  Nose: Nose normal.  Mouth/Throat: Oropharynx is clear and moist. No oropharyngeal exudate.  Eyes: Conjunctivae and EOM are normal. Pupils are equal, round, and reactive to light. Right eye exhibits no discharge. Left eye exhibits no discharge. No scleral icterus.  Neck: Normal range of motion. Neck supple. No tracheal deviation present. No thyromegaly present.  Cardiovascular: Normal rate, regular rhythm and normal heart sounds.  Exam reveals no gallop and no friction rub.   No murmur heard. Pulmonary/Chest: Effort normal and breath sounds normal. She has no wheezes. She has no rales.  Abdominal: Soft. Bowel sounds are normal. She exhibits no distension  and no mass. There is no tenderness. There is no rebound and no guarding.  Musculoskeletal: Normal range of motion. She exhibits no edema.  Lymphadenopathy:    She has no cervical adenopathy.  Neurological: She is alert and oriented to person, place, and time. She has normal reflexes. No cranial nerve deficit. Gait normal. Coordination normal.  Skin: Skin is warm and dry. No rash noted.  Psychiatric: Mood, memory, affect and judgment normal.  Nursing note and vitals reviewed.   LABORATORY DATA:  I have reviewed the data as listed Lab Results  Component Value Date   WBC 9.4 10/29/2015   HGB 12.9 10/29/2015   HCT 41.2 10/29/2015   MCV 95.2 10/29/2015   PLT 307 10/29/2015   CMP     Component Value Date/Time   NA 139 10/29/2015 1420   K 3.8 10/29/2015 1420   CL 104 10/29/2015 1420    CO2 27 10/29/2015 1420   GLUCOSE 141* 10/29/2015 1420   BUN 9 10/29/2015 1420   CREATININE 1.12* 10/29/2015 1420   CALCIUM 9.5 10/29/2015 1420   PROT 6.6 10/29/2015 1420   ALBUMIN 4.0 10/29/2015 1420   AST 19 10/29/2015 1420   ALT 13* 10/29/2015 1420   ALKPHOS 102 10/29/2015 1420   BILITOT 1.1 10/29/2015 1420   GFRNONAA 48* 10/29/2015 1420   GFRAA 55* 10/29/2015 1420    RADIOGRAPHIC STUDIES: I have personally reviewed the radiological images as listed and agreed with the findings in the report.  CLINICAL DATA: Specimen radiograph status post right breast lumpectomy.  EXAM: SPECIMEN RADIOGRAPH OF THE RIGHT BREAST  COMPARISON: Previous exam(s).  FINDINGS: Status post excision of the right breast. The radioactive seed and 2 biopsy marker clips are present, completely intact, and were marked for pathology.  IMPRESSION: Specimen radiograph of the right breast.   Electronically Signed  By: Ammie Ferrier M.D.  On: 11/03/2015 13:16  PATHOLOGY:      ASSESSMENT & PLAN:  Invasive ductal carcinoma triple positive  ER+ (100%), PR+ (10%), HER 2 neu +  RIGHT breast cancer, upper inner quadrant Screening mammogram on 09/04/2015 with possible R breast mass 2 densities noted in the R breast upper inner quadrant within a centimeter of each other (9 and 10 cm from the nipple) 11/03/2015 partial mastectomy, sentinel node biopsy and port placement with Dr. Brantley Stage Final pathology [id number] ER+PR+ Her 2 neu+ R breast carcinoma  We discussed at length the nature of HER 2 positive breast cancer. I addressed studies in women over 70, I addressed her overall health. We discussed options for management/treatment including XRT/herceptin and AI therapy.  We discussed the additional benefit of adding weekly taxane for 12 weeks such as taxol. She is reluctant to use taxotere because she has heard it can cause permanent hair loss. We discussed risk of recurrence and she was provided  with information from adjuvant online.  Prior to initiation of therapy she wishes to think on adding a taxane. I advised her she can come back later this week to discuss or contact [facility].  She was given reading information today as well.  All questions were answered. The patient knows to call the clinic with any problems, questions or concerns.  This note was electronically signed.   This document serves as a record of services personally performed by Ancil Linsey, MD. It was created on her behalf by Arlyce Harman, a trained medical scribe. The creation of this record is based on the scribe's personal observations and the provider's statements to them.  This document has been checked and approved by the attending provider.  I have reviewed the above documentation for accuracy and completeness, and I agree with the above.   Molli Hazard, MD  11/17/2015 1:24 PM

## 2015-11-19 ENCOUNTER — Encounter (HOSPITAL_COMMUNITY)
Admission: RE | Admit: 2015-11-19 | Discharge: 2015-11-19 | Disposition: A | Discharge status: Home / Self Care | Payer: Medicare Other | Source: Ambulatory Visit | Attending: Hematology & Oncology | Admitting: Hematology & Oncology

## 2015-11-19 ENCOUNTER — Encounter (HOSPITAL_COMMUNITY): Payer: Self-pay

## 2015-11-19 DIAGNOSIS — Z79899 Other long term (current) drug therapy: Secondary | ICD-10-CM | POA: Insufficient documentation

## 2015-11-19 DIAGNOSIS — C50919 Malignant neoplasm of unspecified site of unspecified female breast: Secondary | ICD-10-CM | POA: Diagnosis not present

## 2015-11-19 DIAGNOSIS — C50211 Malignant neoplasm of upper-inner quadrant of right female breast: Secondary | ICD-10-CM | POA: Diagnosis not present

## 2015-11-19 MED ORDER — HEPARIN SOD (PORK) LOCK FLUSH 100 UNIT/ML IV SOLN
INTRAVENOUS | Status: AC
  Filled 2015-11-19: qty 5

## 2015-11-19 MED ORDER — TECHNETIUM TC 99M-LABELED RED BLOOD CELLS IV KIT
25.0000 | PACK | Freq: Once | INTRAVENOUS | Status: AC | PRN
  Administered 2015-11-19: 25.5 via INTRAVENOUS

## 2015-11-20 ENCOUNTER — Ambulatory Visit (HOSPITAL_COMMUNITY): Payer: Medicare Other | Admitting: Hematology & Oncology

## 2015-11-20 ENCOUNTER — Encounter (HOSPITAL_BASED_OUTPATIENT_CLINIC_OR_DEPARTMENT_OTHER): Payer: Medicare Other | Admitting: Hematology & Oncology

## 2015-11-20 ENCOUNTER — Other Ambulatory Visit (HOSPITAL_COMMUNITY): Payer: Self-pay | Admitting: Oncology

## 2015-11-20 DIAGNOSIS — C50211 Malignant neoplasm of upper-inner quadrant of right female breast: Secondary | ICD-10-CM

## 2015-11-20 DIAGNOSIS — Z170 Estrogen receptor positive status [ER+]: Secondary | ICD-10-CM | POA: Diagnosis not present

## 2015-11-20 MED ORDER — ONDANSETRON HCL 8 MG PO TABS: 8.0000 mg | ORAL_TABLET | Freq: Three times a day (TID) | ORAL | Status: DC | PRN

## 2015-11-20 MED ORDER — LIDOCAINE-PRILOCAINE 2.5-2.5 % EX CREA: TOPICAL_CREAM | CUTANEOUS | Status: DC

## 2015-11-20 MED ORDER — PROCHLORPERAZINE MALEATE 10 MG PO TABS: 10.0000 mg | ORAL_TABLET | Freq: Four times a day (QID) | ORAL | Status: DC | PRN

## 2015-11-20 NOTE — Patient Instructions (Signed)
[place]   CHEMOTHERAPY INSTRUCTIONS  Premeds: Zofran - for nausea/vomiting prevention/reduction. Dexamethasone - steroid - given to reduce the risk of you having an allergic type reaction to the Taxol. Dex can cause you to feel energized, nervous/anxious/jittery, make you have trouble sleeping, and/or make you feel hot/flushed in the face/neck and/or look pink/red in the face/neck. These side effects will pass as the Dex wears off. Benadryl - antihistamine - given to reduce the risk of you having an allergic reaction to the Taxol. Pepcid - a different type of antihistamine - given to reduce the risk of you having an allergic reaction to the Taxol. (premeds will take 1 hour to infuse). Prior to taking Herceptin you will receive: Tylenol to reduce the chance of developing fever, and Benadryl tablet - to reduce the chance of developing chills, allergic type reaction to the Herceptin. As long as you take Taxol & Herceptin together you will only receive the Benadryl via the IV route.   Taxol - the first time you receive this drug we will titrate it very slowly to ensure that you do not have or are not having an allergic reaction to the chemo. Side Effects: hair loss, lowers your white blood cells (fight infection), muscle aches, nausea/vomiting, irritation to the mouth (mouth sores, pain in your mouth) *neuropathy - numbness/tingling/burning in hands/fingers/feet/toes. We need to know as soon as this begins to happen so that we can monitor it and treat if necessary. The numbness generally begins in the fingertips of tips of toes and then begins to travel up the finger/toe/hand/foot. We never want you getting to where you can't pick up a pen, coin, zip a zipper, button a button, or have trouble walking. You must tell us immediately if you are experiencing peripheral neuropathy! (the first time you receive this, it will take approximately 3 hours, after that it will only take 1  hour) You will receive this drug weekly x 12 weeks and then you will be finished with Taxol.    Herceptin - this is given to patients who overexpress HER2. Herceptin works on the surface of the cancer cell by blocking the chemical signals that can stimulate this uncontrolled growth. Some breast cancer cells make (overexpress) too many copies of a particular gene known as HER2. The HER2 gene makes a protein known as a HER2 receptor. HER2 receptors are like ears, or antennae, on the surface of all cells. These HER2 receptors receive signals that stimulate the cell to grow and multiply. But breast cancer cells with too many HER2 receptors can pick up too many growth signals and so start growing and multiplying too much and too fast. Breast cancer cells that overexpress the HER2 gene are said to be HER2-positive. Herceptin works by attaching itself to the HER2 receptors on the surface of breast cancer cells and blocking them from receiving growth signals. By blocking the signals, Herceptin can slow or stop the growth of the breast cancer. Herceptin is an example of an immune targeted therapy. In addition to blocking HER2 receptors, Herceptin can also help fight breast cancer by alerting the immune system to destroy cancer cells onto which it is attached.  You will receive this drug weekly with Taxol and then when you are finished with Taxol, you will only receive this drug once every 21 days until you complete your year of treatment with Herceptin.    Side Effects that may occur during infusion include: chills, fever, headache, dizziness, shortness  of breath, low blood pressure, rash.   Herceptin causes flu-like symptoms in about 40% of the people who take it. These symptoms may include:  fever   chills   muscle aches   nausea   We will have to perform MUGA scans or 2D echoes periodically during treatment because a side effect of this drug can be cardiotoxicity (weakening of the pumping muscle  of the heart)  The first time you receive this it will take 90 minutes, second time 60 minutes, and the third time and thereafter 30 minutes.   POTENTIAL SIDE EFFECTS OF TREATMENT: Increased Susceptibility to Infection, Vomiting, Constipation, Hair Thinning, Bone Marrow Suppression, Abdominal Cramping, Complete Hair Loss, Nausea, Diarrhea and Mouth Sores   SELF IMAGE NEEDS AND REFERRALS MADE: Obtain hair accessories as soon as possible (wigs, scarves, turbans,caps,etc.) Referral to Look Good[employer] Feel Better consultant paperwork provided   EDUCATIONAL MATERIALS GIVEN AND REVIEWED: Chemotherapy and You Specific Instructions Sheets: Herceptin, Taxol, Benadryl, Dexamethasone, Pepcid, Zofran, EMLA cream, Zofran tablet, Compazine tablet   SELF CARE ACTIVITIES WHILE ON CHEMOTHERAPY: Increase your fluid intake 48 hours prior to treatment and drink at least 2 quarts per day after treatment., No alcohol intake., No aspirin or other medications unless approved by your oncologist., Eat foods that are light and easy to digest., Eat foods at cold or room temperature., No fried, fatty, or spicy foods immediately before or after treatment., Have teeth cleaned professionally before starting treatment. Keep dentures and partial plates clean., Use soft toothbrush and do not use mouthwashes that contain alcohol. Biotene is a good mouthwash that is available at most pharmacies or may be ordered by calling [phone]., Use warm salt water gargles (1 teaspoon salt per 1 quart warm water) before and after meals and at bedtime. Or you may rinse with 2 tablespoons of three -percent hydrogen peroxide mixed in eight ounces of water., Always use sunscreen with SPF (Sun Protection Factor) of 30 or higher., Use your nausea medication as directed to prevent nausea., Use your stool softener or laxative as directed to prevent constipation. and Use your anti-diarrheal medication as directed to stop diarrhea.  Please wash your  hands for at least 30 seconds using warm soapy water. Handwashing is the #1 way to prevent the spread of germs. Stay away from sick people or people who are getting over a cold. If you develop respiratory systems such as green/yellow mucus production or productive cough or persistent cough let us know and we will see if you need an antibiotic. It is a good idea to keep a pair of gloves on when going into grocery stores/[employer] to decrease your risk of coming into contact with germs on the carts, etc. Carry alcohol hand gel with you at all times and use it frequently if out in public. All foods need to be cooked thoroughly. No raw foods. No medium or undercooked meats, eggs. If your food is cooked medium well, it does not need to be hot pink or saturated with bloody liquid at all. Vegetables and fruits need to be washed/rinsed under the faucet with a dish detergent before being consumed. You can eat raw fruits and vegetables unless we tell you otherwise but it would be best if you cooked them or bought frozen. Do not eat off of salad bars or hot bars unless you really trust the cleanliness of the restaurant. If you need dental work, please let Dr. Whitney Muse know before you go for your appointment so that we can coordinate the  best possible time for you in regards to your chemo regimen. You need to also let your dentist know that you are actively taking chemo. We may need to do labs prior to your dental appointment. We also want your bowels moving at least every other day. If this is not happening, we need to know so that we can get you on a bowel regimen to help you go. If you are going to have sex your partner must wear a condom. This is to prevent them from potential chemotherapy exposure. This should continue for 28 days past the completion of chemo.      MEDICATIONS: You have been given prescriptions for the following medications:  Zofran/Ondansetron 17m tablet. Take 1 tablet every 8 hours as needed for  nausea/vomiting. (#1 nausea med to take, this can constipate)  Compazine/Prochlorperazine 120mtablet. Take 1 tablet every 6 hours as needed for nausea/vomiting. (#2 nausea med to take, this can make you sleepy)  EMLA cream. Apply a quarter size amount to port site 1 hour prior to chemo. Do not rub in. Cover with plastic wrap.   Over-the-Counter Meds:  Miralax 1 capful in 8 oz of fluid daily. May increase to two times a day if needed. This is a stool softener. If this doesn't work proceed you can add:  Senokot S - start with 1 tablet two times a day and increase to 4 tablets two times a day if needed. (total of 8 tablets in a 24 hour period). This is a stimulant laxative.   Call [facility] this does not help your bowels move.   Imodium 20m58mapsule. Take 2 capsules after the 1st loose stool and then 1 capsule every 2 hours until you go a total of 12 hours without having a loose stool. Call the [facility] loose stools continue. If diarrhea occurs @ bedtime, take 2 capsules @ bedtime. Then take 2 capsules every 4 hours until morning. Call [facility]TOM[facility] TO REPORT AS SOON AS POSSIBLE AFTER TREATMENT:  FEVER GREATER THAN 100.5 F  CHILLS WITH OR WITHOUT FEVER  NAUSEA AND VOMITING THAT IS NOT CONTROLLED WITH YOUR NAUSEA MEDICATION  UNUSUAL SHORTNESS OF BREATH  UNUSUAL BRUISING OR BLEEDING  TENDERNESS IN MOUTH AND THROAT WITH OR WITHOUT PRESENCE OF ULCERS  URINARY PROBLEMS  BOWEL PROBLEMS  UNUSUAL RASH   Wear comfortable clothing and clothing appropriate for easy access to any Portacath or PICC line. Let us [place] if there is anything that we can do to make your therapy better!      I have been informed and understand all of the instructions given to me and have received a copy. I have been instructed to call the clinic [phone] my family physician as soon as possible for continued medical care, if indicated. I do not have any more questions at this time  but understand that I may call the [facility] the Patient Navigator at ([phone] office hours should I have questions or need assistance in obtaining follow-up care. Trastuzumab injection for infusion What is this medicine? TRASTUZUMAB (tras TOO zoo mab) is a monoclonal antibody. It is used to treat breast cancer and stomach cancer. This medicine may be used for other purposes; ask your health care provider or pharmacist if you have questions. What should I tell my health care provider before I take this medicine? They need to know if you have any of these conditions: -heart disease -heart failure -infection (especially  a virus infection such as chickenpox, cold sores, or herpes) -lung or breathing disease, like asthma -recent or ongoing radiation therapy -an unusual or allergic reaction to trastuzumab, benzyl alcohol, or other medications, foods, dyes, or preservatives -pregnant or trying to get pregnant -breast-feeding How should I use this medicine? This drug is given as an infusion into a vein. It is administered in a hospital or clinic by a specially trained health care professional. Talk to your pediatrician regarding the use of this medicine in children. This medicine is not approved for use in children. Overdosage: If you think you have taken too much of this medicine contact a poison control center or emergency room at once. NOTE: This medicine is only for you. Do not share this medicine with others. What if I miss a dose? It is important not to miss a dose. Call your doctor or health care professional if you are unable to keep an appointment. What may interact with this medicine? -doxorubicin -warfarin This list may not describe all possible interactions. Give your health care provider a list of all the medicines, herbs, non-prescription drugs, or dietary supplements you use. Also tell them if you smoke, drink alcohol, or use illegal drugs. Some items may interact  with your medicine. What should I watch for while using this medicine? Visit your doctor for checks on your progress. Report any side effects. Continue your course of treatment even though you feel ill unless your doctor tells you to stop. Call your doctor or health care professional for advice if you get a fever, chills or sore throat, or other symptoms of a cold or flu. Do not treat yourself. Try to avoid being around people who are sick. You may experience fever, chills and shaking during your first infusion. These effects are usually mild and can be treated with other medicines. Report any side effects during the infusion to your health care professional. Fever and chills usually do not happen with later infusions. Do not become pregnant while taking this medicine or for 7 months after stopping it. Women should inform their doctor if they wish to become pregnant or think they might be pregnant. Women of child-bearing potential will need to have a negative pregnancy test before starting this medicine. There is a potential for serious side effects to an unborn child. Talk to your health care professional or pharmacist for more information. Do not breast-feed an infant while taking this medicine or for 7 months after stopping it. Women must use effective birth control with this medicine. What side effects may I notice from receiving this medicine? Side effects that you should report to your doctor or other health care professional as soon as possible: -breathing difficulties -chest pain or palpitations -cough -dizziness or fainting -fever or chills, sore throat -skin rash, itching or hives -swelling of the legs or ankles -unusually weak or tired Side effects that usually do not require medical attention (report to your doctor or other health care professional if they continue or are bothersome): -loss of appetite -headache -muscle aches -nausea This list may not describe all possible side  effects. Call your doctor for medical advice about side effects. You may report side effects to FDA at 1-800-FDA-1088. Where should I keep my medicine? This drug is given in a hospital or clinic and will not be stored at home. NOTE: This sheet is a summary. It may not cover all possible information. If you have questions about this medicine, talk to your doctor, pharmacist, or health  care provider.    2016, Elsevier/[employer] Standard. (2015-02-06 11:49:32) Trastuzumab injection for infusion What is this medicine? TRASTUZUMAB (tras TOO zoo mab) is a monoclonal antibody. It is used to treat breast cancer and stomach cancer. This medicine may be used for other purposes; ask your health care provider or pharmacist if you have questions. What should I tell my health care provider before I take this medicine? They need to know if you have any of these conditions: -heart disease -heart failure -infection (especially a virus infection such as chickenpox, cold sores, or herpes) -lung or breathing disease, like asthma -recent or ongoing radiation therapy -an unusual or allergic reaction to trastuzumab, benzyl alcohol, or other medications, foods, dyes, or preservatives -pregnant or trying to get pregnant -breast-feeding How should I use this medicine? This drug is given as an infusion into a vein. It is administered in a hospital or clinic by a specially trained health care professional. Talk to your pediatrician regarding the use of this medicine in children. This medicine is not approved for use in children. Overdosage: If you think you have taken too much of this medicine contact a poison control center or emergency room at once. NOTE: This medicine is only for you. Do not share this medicine with others. What if I miss a dose? It is important not to miss a dose. Call your doctor or health care professional if you are unable to keep an appointment. What may interact with this  medicine? -doxorubicin -warfarin This list may not describe all possible interactions. Give your health care provider a list of all the medicines, herbs, non-prescription drugs, or dietary supplements you use. Also tell them if you smoke, drink alcohol, or use illegal drugs. Some items may interact with your medicine. What should I watch for while using this medicine? Visit your doctor for checks on your progress. Report any side effects. Continue your course of treatment even though you feel ill unless your doctor tells you to stop. Call your doctor or health care professional for advice if you get a fever, chills or sore throat, or other symptoms of a cold or flu. Do not treat yourself. Try to avoid being around people who are sick. You may experience fever, chills and shaking during your first infusion. These effects are usually mild and can be treated with other medicines. Report any side effects during the infusion to your health care professional. Fever and chills usually do not happen with later infusions. Do not become pregnant while taking this medicine or for 7 months after stopping it. Women should inform their doctor if they wish to become pregnant or think they might be pregnant. Women of child-bearing potential will need to have a negative pregnancy test before starting this medicine. There is a potential for serious side effects to an unborn child. Talk to your health care professional or pharmacist for more information. Do not breast-feed an infant while taking this medicine or for 7 months after stopping it. Women must use effective birth control with this medicine. What side effects may I notice from receiving this medicine? Side effects that you should report to your doctor or other health care professional as soon as possible: -breathing difficulties -chest pain or palpitations -cough -dizziness or fainting -fever or chills, sore throat -skin rash, itching or hives -swelling of  the legs or ankles -unusually weak or tired Side effects that usually do not require medical attention (report to your doctor or other health care professional if they continue  or are bothersome): -loss of appetite -headache -muscle aches -nausea This list may not describe all possible side effects. Call your doctor for medical advice about side effects. You may report side effects to FDA at 1-800-FDA-1088. Where should I keep my medicine? This drug is given in a hospital or clinic and will not be stored at home. NOTE: This sheet is a summary. It may not cover all possible information. If you have questions about this medicine, talk to your doctor, pharmacist, or health care provider.    2016, Elsevier/Gold Standard. (2015-02-06 11:49:32) Famotidine injection What is this medicine? FAMOTIDINE (fa MOE ti deen) is a type of antihistamine that blocks the release of stomach acid. It is used to treat stomach or intestinal ulcers. It can relieve ulcer pain and discomfort, and the heartburn from acid reflux. This medicine may be used for other purposes; ask your health care provider or pharmacist if you have questions. What should I tell my health care provider before I take this medicine? They need to know if you have any of these conditions: -kidney or liver disease -an unusual or allergic reaction to famotidine, other medicines, foods, dyes, or preservatives -pregnant or trying to get pregnant -breast-feeding How should I use this medicine? This medicine is for infusion into a vein. It is given by a health care professional in a hospital or clinic setting. Talk to your pediatrician regarding the use of this medicine in children. Special care may be needed. Overdosage: If you think you have taken too much of this medicine contact a poison control center or emergency room at once. NOTE: This medicine is only for you. Do not share this medicine with others. What if I miss a dose? This does not  apply. What may interact with this medicine? -delavirdine -itraconazole -ketoconazole This list may not describe all possible interactions. Give your health care provider a list of all the medicines, herbs, non-prescription drugs, or dietary supplements you use. Also tell them if you smoke, drink alcohol, or use illegal drugs. Some items may interact with your medicine. What should I watch for while using this medicine? Tell your doctor or health care professional if your condition does not start to get better or gets worse. Do not take with aspirin, ibuprofen, or other antiinflammatory medicines. These can aggravate your condition. Do not smoke cigarettes or drink alcohol. These increase irritation in your stomach and can increase the time it will take for ulcers to heal. Cigarettes and alcohol can also worsen acid reflux or heartburn. If you get black, tarry stools or vomit up what looks like coffee grounds, call your doctor or health care professional at once. You may have a bleeding ulcer. What side effects may I notice from receiving this medicine? Side effects that you should report to your doctor or health care professional as soon as possible: -allergic reactions like skin rash, itching or hives, swelling of the face, lips, or tongue -agitation, nervousness -confusion -hallucinations Side effects that usually do not require medical attention (report to your doctor or health care professional if they continue or are bothersome): -constipation -diarrhea -dizziness -headache This list may not describe all possible side effects. Call your doctor for medical advice about side effects. You may report side effects to FDA at 1-800-FDA-1088. Where should I keep my medicine? This medicine is given in a hospital or clinic. You will not be given this medicine to store at home. NOTE: This sheet is a summary. It may not cover all possible  information. If you have questions about this medicine,  talk to your doctor, pharmacist, or health care provider.    2016, Elsevier/Gold Standard. (2008-03-05 13:24:51) Diphenhydramine injection What is this medicine? DIPHENHYDRAMINE (dye fen HYE dra meen) is an antihistamine. It is used to treat the symptoms of an allergic reaction and motion sickness. It is also used to treat Parkinson's disease. This medicine may be used for other purposes; ask your health care provider or pharmacist if you have questions. What should I tell my health care provider before I take this medicine? They need to know if you have any of these conditions: -asthma or lung disease -glaucoma -high blood pressure or heart disease -liver disease -pain or difficulty passing urine -prostate trouble -ulcers or other stomach problems -an unusual or allergic reaction to diphenhydramine, antihistamines, other medicines foods, dyes, or preservatives -pregnant or trying to get pregnant -breast-feeding How should I use this medicine? This medicine is for injection into a vein or a muscle. It is usually given by a health care professional in a hospital or clinic setting. If you get this medicine at home, you will be taught how to prepare and give this medicine. Use exactly as directed. Take your medicine at regular intervals. Do not take your medicine more often than directed. It is important that you put your used needles and syringes in a special sharps container. Do not put them in a trash can. If you do not have a sharps container, call your pharmacist or healthcare provider to get one. Talk to your pediatrician regarding the use of this medicine in children. While this drug may be prescribed for selected conditions, precautions do apply. This medicine is not approved for use in newborns and premature babies. Patients over 4 years old may have a stronger reaction and need a smaller dose. Overdosage: If you think you have taken too much of this medicine contact a poison control  center or emergency room at once. NOTE: This medicine is only for you. Do not share this medicine with others. What if I miss a dose? If you miss a dose, take it as soon as you can. If it is almost time for your next dose, take only that dose. Do not take double or extra doses. What may interact with this medicine? Do not take this medicine with any of the following medications: -MAOIs like Carbex, Eldepryl, Marplan, Nardil, and Parnate This medicine may also interact with the following medications: -alcohol -barbiturates, like phenobarbital -medicines for bladder spasm like oxybutynin, tolterodine -medicines for blood pressure -medicines for depression, anxiety, or psychotic disturbances -medicines for movement abnormalities or Parkinson's disease -medicines for sleep -other medicines for cold, cough or allergy -some medicines for the stomach like chlordiazepoxide, dicyclomine This list may not describe all possible interactions. Give your health care provider a list of all the medicines, herbs, non-prescription drugs, or dietary supplements you use. Also tell them if you smoke, drink alcohol, or use illegal drugs. Some items may interact with your medicine. What should I watch for while using this medicine? Your condition will be monitored carefully while you are receiving this medicine. Tell your doctor or healthcare professional if your symptoms do not start to get better or if they get worse. You may get drowsy or dizzy. Do not drive, use machinery, or do anything that needs mental alertness until you know how this medicine affects you. Do not stand or sit up quickly, especially if you are an older patient. This reduces the risk  of dizzy or fainting spells. Alcohol may interfere with the effect of this medicine. Avoid alcoholic drinks. Your mouth may get dry. Chewing sugarless gum or sucking hard candy, and drinking plenty of water may help. Contact your doctor if the problem does not go  away or is severe. What side effects may I notice from receiving this medicine? Side effects that you should report to your doctor or health care professional as soon as possible: -allergic reactions like skin rash, itching or hives, swelling of the face, lips, or tongue -breathing problems -changes in vision -chills -confused, agitated, nervous -irregular or fast heartbeat -low blood pressure -seizures -tremor -trouble passing urine -unusual bleeding or bruising -unusually weak or tired Side effects that usually do not require medical attention (report to your doctor or health care professional if they continue or are bothersome): -constipation, diarrhea -drowsy -headache -loss of appetite -stomach upset, vomiting -sweating -thick mucous This list may not describe all possible side effects. Call your doctor for medical advice about side effects. You may report side effects to FDA at 1-800-FDA-1088. Where should I keep my medicine? Keep out of the reach of children. If you are using this medicine at home, you will be instructed on how to store this medicine. Throw away any unused medicine after the expiration date on the label. NOTE: This sheet is a summary. It may not cover all possible information. If you have questions about this medicine, talk to your doctor, pharmacist, or health care provider.    2016, [employer]. (2008-02-19 14:28:35) Dexamethasone injection What is this medicine? DEXAMETHASONE (dex a METH a sone) is a corticosteroid. It is used to treat inflammation of the skin, joints, lungs, and other organs. Common conditions treated include asthma, allergies, and arthritis. It is also used for other conditions, like blood disorders and diseases of the adrenal glands. This medicine may be used for other purposes; ask your health care provider or pharmacist if you have questions. What should I tell my health care provider before I take this medicine? They  need to know if you have any of these conditions: -blood clotting problems -Cushing's syndrome -diabetes -glaucoma -heart problems or disease -high blood pressure -infection like herpes, measles, tuberculosis, or chickenpox -kidney disease -liver disease -mental problems -myasthenia gravis -osteoporosis -previous heart attack -seizures -stomach, ulcer or intestine disease including colitis and diverticulitis -thyroid problem -an unusual or allergic reaction to dexamethasone, corticosteroids, other medicines, lactose, foods, dyes, or preservatives -pregnant or trying to get pregnant -breast-feeding How should I use this medicine? This medicine is for injection into a muscle, joint, lesion, soft tissue, or vein. It is given by a health care professional in a hospital or clinic setting. Talk to your pediatrician regarding the use of this medicine in children. Special care may be needed. Overdosage: If you think you have taken too much of this medicine contact a poison control center or emergency room at once. NOTE: This medicine is only for you. Do not share this medicine with others. What if I miss a dose? This may not apply. If you are having a series of injections over a prolonged period, try not to miss an appointment. Call your doctor or health care professional to reschedule if you are unable to keep an appointment. What may interact with this medicine? Do not take this medicine with any of the following medications: -mifepristone, RU-486 -vaccines This medicine may also interact with the following medications: -amphotericin B -antibiotics like clarithromycin, erythromycin, and troleandomycin -aspirin and aspirin-like  drugs -barbiturates like phenobarbital -carbamazepine -cholestyramine -cholinesterase inhibitors like donepezil, galantamine, rivastigmine, and tacrine -cyclosporine -digoxin -diuretics -ephedrine -female hormones, like estrogens or progestins and birth  control pills -indinavir -isoniazid -ketoconazole -medicines for diabetes -medicines that improve muscle tone or strength for conditions like myasthenia gravis -NSAIDs, medicines for pain and inflammation, like ibuprofen or naproxen -phenytoin -rifampin -thalidomide -warfarin This list may not describe all possible interactions. Give your health care provider a list of all the medicines, herbs, non-prescription drugs, or dietary supplements you use. Also tell them if you smoke, drink alcohol, or use illegal drugs. Some items may interact with your medicine. What should I watch for while using this medicine? Your condition will be monitored carefully while you are receiving this medicine. If you are taking this medicine for a long time, carry an identification card with your name and address, the type and dose of your medicine, and your doctor's name and address. This medicine may increase your risk of getting an infection. Stay away from people who are sick. Tell your doctor or health care professional if you are around anyone with measles or chickenpox. Talk to your health care provider before you get any vaccines that you take this medicine. If you are going to have surgery, tell your doctor or health care professional that you have taken this medicine within the last twelve months. Ask your doctor or health care professional about your diet. You may need to lower the amount of salt you eat. The medicine can increase your blood sugar. If you are a diabetic check with your doctor if you need help adjusting the dose of your diabetic medicine. What side effects may I notice from receiving this medicine? Side effects that you should report to your doctor or health care professional as soon as possible: -allergic reactions like skin rash, itching or hives, swelling of the face, lips, or tongue -black or tarry stools -change in the amount of urine -changes in vision -confusion, excitement,  restlessness, a false sense of well-being -fever, sore throat, sneezing, cough, or other signs of infection, wounds that will not heal -hallucinations -increased thirst -mental depression, mood swings, mistaken feelings of self importance or of being mistreated -pain in hips, back, ribs, arms, shoulders, or legs -pain, redness, or irritation at the injection site -redness, blistering, peeling or loosening of the skin, including inside the mouth -rounding out of face -swelling of feet or lower legs -unusual bleeding or bruising -unusual tired or weak -wounds that do not heal Side effects that usually do not require medical attention (report to your doctor or health care professional if they continue or are bothersome): -diarrhea or constipation -change in taste -headache -nausea, vomiting -skin problems, acne, thin and shiny skin -touble sleeping -unusual growth of hair on the face or body -weight gain This list may not describe all possible side effects. Call your doctor for medical advice about side effects. You may report side effects to FDA at 1-800-FDA-1088. Where should I keep my medicine? This drug is given in a hospital or clinic and will not be stored at home. NOTE: This sheet is a summary. It may not cover all possible information. If you have questions about this medicine, talk to your doctor, pharmacist, or health care provider.    2016, [employer]. (2008-02-21 14:04:12) Ondansetron injection What is this medicine? ONDANSETRON (on DAN se tron) is used to treat nausea and vomiting caused by chemotherapy. It is also used to prevent or treat nausea and vomiting after  surgery. This medicine may be used for other purposes; ask your health care provider or pharmacist if you have questions. What should I tell my health care provider before I take this medicine? They need to know if you have any of these conditions: -heart disease -history of irregular  heartbeat -liver disease -low levels of magnesium or potassium in the blood -an unusual or allergic reaction to ondansetron, granisetron, other medicines, foods, dyes, or preservatives -pregnant or trying to get pregnant -breast-feeding How should I use this medicine? This medicine is for infusion into a vein. It is given by a health care professional in a hospital or clinic setting. Talk to your pediatrician regarding the use of this medicine in children. Special care may be needed. Overdosage: If you think you have taken too much of this medicine contact a poison control center or emergency room at once. NOTE: This medicine is only for you. Do not share this medicine with others. What if I miss a dose? This does not apply. What may interact with this medicine? Do not take this medicine with any of the following medications: -apomorphine -certain medicines for fungal infections like fluconazole, itraconazole, ketoconazole, posaconazole, voriconazole -cisapride -dofetilide -dronedarone -pimozide -thioridazine -ziprasidone This medicine may also interact with the following medications: -carbamazepine -certain medicines for depression, anxiety, or psychotic disturbances -fentanyl -linezolid -MAOIs like Carbex, Eldepryl, Marplan, Nardil, and Parnate -methylene blue (injected into a vein) -other medicines that prolong the QT interval (cause an abnormal heart rhythm) -phenytoin -rifampicin -tramadol This list may not describe all possible interactions. Give your health care provider a list of all the medicines, herbs, non-prescription drugs, or dietary supplements you use. Also tell them if you smoke, drink alcohol, or use illegal drugs. Some items may interact with your medicine. What should I watch for while using this medicine? Your condition will be monitored carefully while you are receiving this medicine. What side effects may I notice from receiving this medicine? Side effects  that you should report to your doctor or health care professional as soon as possible: -allergic reactions like skin rash, itching or hives, swelling of the face, lips, or tongue -breathing problems -confusion -dizziness -fast or irregular heartbeat -feeling faint or lightheaded, falls -fever and chills -loss of balance or coordination -seizures -sweating -swelling of the hands and feet -tightness in the chest -tremors -unusually weak or tired Side effects that usually do not require medical attention (report to your doctor or health care professional if they continue or are bothersome): -constipation or diarrhea -headache This list may not describe all possible side effects. Call your doctor for medical advice about side effects. You may report side effects to FDA at 1-800-FDA-1088. Where should I keep my medicine? This drug is given in a hospital or clinic and will not be stored at home. NOTE: This sheet is a summary. It may not cover all possible information. If you have questions about this medicine, talk to your doctor, pharmacist, or health care provider.    2016, [employer]. (2013-08-07 16:18:28) Lidocaine; Prilocaine cream What is this medicine? LIDOCAINE; PRILOCAINE (LYE doe kane; PRIL oh kane) is a topical anesthetic that causes loss of feeling in the skin and surrounding tissues. It is used to numb the skin before procedures or injections. This medicine may be used for other purposes; ask your health care provider or pharmacist if you have questions. What should I tell my health care provider before I take this medicine? They need to know if you have any  of these conditions: -glucose-6-phosphate deficiencies -heart disease -kidney or liver disease -methemoglobinemia -an unusual or allergic reaction to lidocaine, prilocaine, other medicines, foods, dyes, or preservatives -pregnant or trying to get pregnant -breast-feeding How should I use this  medicine? This medicine is for external use only on the skin. Do not take by mouth. Follow the directions on the prescription label. Wash hands before and after use. Do not use more or leave in contact with the skin longer than directed. Do not apply to eyes or open wounds. It can cause irritation and blurred or temporary loss of vision. If this medicine comes in contact with your eyes, immediately rinse the eye with water. Do not touch or rub the eye. Contact your health care provider right away. Talk to your pediatrician regarding the use of this medicine in children. While this medicine may be prescribed for children for selected conditions, precautions do apply. Overdosage: If you think you have taken too much of this medicine contact a poison control center or emergency room at once. NOTE: This medicine is only for you. Do not share this medicine with others. What if I miss a dose? This medicine is usually only applied once prior to each procedure. It must be in contact with the skin for a period of time for it to work. If you applied this medicine later than directed, tell your health care professional before starting the procedure. What may interact with this medicine? -acetaminophen -chloroquine -dapsone -medicines to control heart rhythm -nitrates like nitroglycerin and nitroprusside -other ointments, creams, or sprays that may contain anesthetic medicine -phenobarbital -phenytoin -quinine -sulfonamides like sulfacetamide, sulfamethoxazole, sulfasalazine and others This list may not describe all possible interactions. Give your health care provider a list of all the medicines, herbs, non-prescription drugs, or dietary supplements you use. Also tell them if you smoke, drink alcohol, or use illegal drugs. Some items may interact with your medicine. What should I watch for while using this medicine? Be careful to avoid injury to the treated area while it is numb and you are not aware of  pain. Avoid scratching, rubbing, or exposing the treated area to hot or cold temperatures until complete sensation has returned. The numb feeling will wear off a few hours after applying the cream. What side effects may I notice from receiving this medicine? Side effects that you should report to your doctor or health care professional as soon as possible: -blurred vision -chest pain -difficulty breathing -dizziness -drowsiness -fast or irregular heartbeat -skin rash or itching -swelling of your throat, lips, or face -trembling Side effects that usually do not require medical attention (report to your doctor or health care professional if they continue or are bothersome): -changes in ability to feel hot or cold -redness and swelling at the application site This list may not describe all possible side effects. Call your doctor for medical advice about side effects. You may report side effects to FDA at 1-800-FDA-1088. Where should I keep my medicine? Keep out of reach of children. Store at room temperature between 15 and 30 degrees C (59 and 86 degrees F). Keep container tightly closed. Throw away any unused medicine after the expiration date. NOTE: This sheet is a summary. It may not cover all possible information. If you have questions about this medicine, talk to your doctor, pharmacist, or health care provider.    2016, [employer]. (2008-05-05 17:14:35) Ondansetron tablets What is this medicine? ONDANSETRON (on DAN se tron) is used to treat nausea and vomiting  caused by chemotherapy. It is also used to prevent or treat nausea and vomiting after surgery. This medicine may be used for other purposes; ask your health care provider or pharmacist if you have questions. What should I tell my health care provider before I take this medicine? They need to know if you have any of these conditions: -heart disease -history of irregular heartbeat -liver disease -low levels of  magnesium or potassium in the blood -an unusual or allergic reaction to ondansetron, granisetron, other medicines, foods, dyes, or preservatives -pregnant or trying to get pregnant -breast-feeding How should I use this medicine? Take this medicine by mouth with a glass of water. Follow the directions on your prescription label. Take your doses at regular intervals. Do not take your medicine more often than directed. Talk to your pediatrician regarding the use of this medicine in children. Special care may be needed. Overdosage: If you think you have taken too much of this medicine contact a poison control center or emergency room at once. NOTE: This medicine is only for you. Do not share this medicine with others. What if I miss a dose? If you miss a dose, take it as soon as you can. If it is almost time for your next dose, take only that dose. Do not take double or extra doses. What may interact with this medicine? Do not take this medicine with any of the following medications: -apomorphine -certain medicines for fungal infections like fluconazole, itraconazole, ketoconazole, posaconazole, voriconazole -cisapride -dofetilide -dronedarone -pimozide -thioridazine -ziprasidone This medicine may also interact with the following medications: -carbamazepine -certain medicines for depression, anxiety, or psychotic disturbances -fentanyl -linezolid -MAOIs like Carbex, Eldepryl, Marplan, Nardil, and Parnate -methylene blue (injected into a vein) -other medicines that prolong the QT interval (cause an abnormal heart rhythm) -phenytoin -rifampicin -tramadol This list may not describe all possible interactions. Give your health care provider a list of all the medicines, herbs, non-prescription drugs, or dietary supplements you use. Also tell them if you smoke, drink alcohol, or use illegal drugs. Some items may interact with your medicine. What should I watch for while using this  medicine? Check with your doctor or health care professional right away if you have any sign of an allergic reaction. What side effects may I notice from receiving this medicine? Side effects that you should report to your doctor or health care professional as soon as possible: -allergic reactions like skin rash, itching or hives, swelling of the face, lips or tongue -breathing problems -confusion -dizziness -fast or irregular heartbeat -feeling faint or lightheaded, falls -fever and chills -loss of balance or coordination -seizures -sweating -swelling of the hands or feet -tightness in the chest -tremors -unusually weak or tired Side effects that usually do not require medical attention (report to your doctor or health care professional if they continue or are bothersome): -constipation or diarrhea -headache This list may not describe all possible side effects. Call your doctor for medical advice about side effects. You may report side effects to FDA at 1-800-FDA-1088. Where should I keep my medicine? Keep out of the reach of children. Store between 2 and 30 degrees C (36 and 86 degrees F). Throw away any unused medicine after the expiration date. NOTE: This sheet is a summary. It may not cover all possible information. If you have questions about this medicine, talk to your doctor, pharmacist, or health care provider.    2016, [employer]. (2013-08-07 16:27:45) Prochlorperazine tablets What is this medicine? PROCHLORPERAZINE (proe klor  PER a zeen) helps to control severe nausea and vomiting. This medicine is also used to treat schizophrenia. It can also help patients who experience anxiety that is not due to psychological illness. This medicine may be used for other purposes; ask your health care provider or pharmacist if you have questions. What should I tell my health care provider before I take this medicine? They need to know if you have any of these  conditions: -blood disorders or disease -dementia -liver disease or jaundice -Parkinson's disease -uncontrollable movement disorder -an unusual or allergic reaction to prochlorperazine, other medicines, foods, dyes, or preservatives -pregnant or trying to get pregnant -breast-feeding How should I use this medicine? Take this medicine by mouth with a glass of water. Follow the directions on the prescription label. Take your doses at regular intervals. Do not take your medicine more often than directed. Do not stop taking this medicine suddenly. This can cause nausea, vomiting, and dizziness. Ask your doctor or health care professional for advice. Talk to your pediatrician regarding the use of this medicine in children. Special care may be needed. While this drug may be prescribed for children as young as 2 years for selected conditions, precautions do apply. Overdosage: If you think you have taken too much of this medicine contact a poison control center or emergency room at once. NOTE: This medicine is only for you. Do not share this medicine with others. What if I miss a dose? If you miss a dose, take it as soon as you can. If it is almost time for your next dose, take only that dose. Do not take double or extra doses. What may interact with this medicine? Do not take this medicine with any of the following medications: -amoxapine -antidepressants like citalopram, escitalopram, fluoxetine, paroxetine, and sertraline -deferoxamine -dofetilide -maprotiline -tricyclic antidepressants like amitriptyline, clomipramine, imipramine, nortiptyline and others This medicine may also interact with the following medications: -lithium -medicines for pain -phenytoin -propranolol -warfarin This list may not describe all possible interactions. Give your health care provider a list of all the medicines, herbs, non-prescription drugs, or dietary supplements you use. Also tell them if you smoke, drink  alcohol, or use illegal drugs. Some items may interact with your medicine. What should I watch for while using this medicine? Visit your doctor or health care professional for regular checks on your progress. You may get drowsy or dizzy. Do not drive, use machinery, or do anything that needs mental alertness until you know how this medicine affects you. Do not stand or sit up quickly, especially if you are an older patient. This reduces the risk of dizzy or fainting spells. Alcohol may interfere with the effect of this medicine. Avoid alcoholic drinks. This medicine can reduce the response of your body to heat or cold. Dress warm in cold weather and stay hydrated in hot weather. If possible, avoid extreme temperatures like saunas, hot tubs, very hot or cold showers, or activities that can cause dehydration such as vigorous exercise. This medicine can make you more sensitive to the sun. Keep out of the sun. If you cannot avoid being in the sun, wear protective clothing and use sunscreen. Do not use sun lamps or tanning beds/booths. Your mouth may get dry. Chewing sugarless gum or sucking hard candy, and drinking plenty of water may help. Contact your doctor if the problem does not go away or is severe. What side effects may I notice from receiving this medicine? Side effects that you should report to  your doctor or health care professional as soon as possible: -blurred vision -breast enlargement in men or women -breast milk in women who are not breast-feeding -chest pain, fast or irregular heartbeat -confusion, restlessness -dark yellow or brown urine -difficulty breathing or swallowing -dizziness or fainting spells -drooling, shaking, movement difficulty (shuffling walk) or rigidity -fever, chills, sore throat -involuntary or uncontrollable movements of the eyes, mouth, head, arms, and legs -seizures -stomach area pain -unusually weak or tired -unusual bleeding or bruising -yellowing of skin  or eyes Side effects that usually do not require medical attention (report to your doctor or health care professional if they continue or are bothersome): -difficulty passing urine -difficulty sleeping -headache -sexual dysfunction -skin rash, or itching This list may not describe all possible side effects. Call your doctor for medical advice about side effects. You may report side effects to FDA at 1-800-FDA-1088. Where should I keep my medicine? Keep out of the reach of children. Store at room temperature between 15 and 30 degrees C (59 and 86 degrees F). Protect from light. Throw away any unused medicine after the expiration date. NOTE: This sheet is a summary. It may not cover all possible information. If you have questions about this medicine, talk to your doctor, pharmacist, or health care provider.    2016, Elsevier/Gold Standard. (2012-03-20 16:59:39) Paclitaxel injection What is this medicine? PACLITAXEL (PAK li TAX el) is a chemotherapy drug. It targets fast dividing cells, like cancer cells, and causes these cells to die. This medicine is used to treat ovarian cancer, breast cancer, and other cancers. This medicine may be used for other purposes; ask your health care provider or pharmacist if you have questions. What should I tell my health care provider before I take this medicine? They need to know if you have any of these conditions: -blood disorders -irregular heartbeat -infection (especially a virus infection such as chickenpox, cold sores, or herpes) -liver disease -previous or ongoing radiation therapy -an unusual or allergic reaction to paclitaxel, alcohol, polyoxyethylated castor oil, other chemotherapy agents, other medicines, foods, dyes, or preservatives -pregnant or trying to get pregnant -breast-feeding How should I use this medicine? This drug is given as an infusion into a vein. It is administered in a hospital or clinic by a specially trained health care  professional. Talk to your pediatrician regarding the use of this medicine in children. Special care may be needed. Overdosage: If you think you have taken too much of this medicine contact a poison control center or emergency room at once. NOTE: This medicine is only for you. Do not share this medicine with others. What if I miss a dose? It is important not to miss your dose. Call your doctor or health care professional if you are unable to keep an appointment. What may interact with this medicine? Do not take this medicine with any of the following medications: -disulfiram -metronidazole This medicine may also interact with the following medications: -cyclosporine -diazepam -ketoconazole -medicines to increase blood counts like filgrastim, pegfilgrastim, sargramostim -other chemotherapy drugs like cisplatin, doxorubicin, epirubicin, etoposide, teniposide, vincristine -quinidine -testosterone -vaccines -verapamil Talk to your doctor or health care professional before taking any of these medicines: -acetaminophen -aspirin -ibuprofen -ketoprofen -naproxen This list may not describe all possible interactions. Give your health care provider a list of all the medicines, herbs, non-prescription drugs, or dietary supplements you use. Also tell them if you smoke, drink alcohol, or use illegal drugs. Some items may interact with your medicine. What should I watch  for while using this medicine? Your condition will be monitored carefully while you are receiving this medicine. You will need important blood work done while you are taking this medicine. This drug may make you feel generally unwell. This is not uncommon, as chemotherapy can affect healthy cells as well as cancer cells. Report any side effects. Continue your course of treatment even though you feel ill unless your doctor tells you to stop. This medicine can cause serious allergic reactions. To reduce your risk you will need to take  other medicine(s) before treatment with this medicine. In some cases, you may be given additional medicines to help with side effects. Follow all directions for their use. Call your doctor or health care professional for advice if you get a fever, chills or sore throat, or other symptoms of a cold or flu. Do not treat yourself. This drug decreases your body's ability to fight infections. Try to avoid being around people who are sick. This medicine may increase your risk to bruise or bleed. Call your doctor or health care professional if you notice any unusual bleeding. Be careful brushing and flossing your teeth or using a toothpick because you may get an infection or bleed more easily. If you have any dental work done, tell your dentist you are receiving this medicine. Avoid taking products that contain aspirin, acetaminophen, ibuprofen, naproxen, or ketoprofen unless instructed by your doctor. These medicines may hide a fever. Do not become pregnant while taking this medicine. Women should inform their doctor if they wish to become pregnant or think they might be pregnant. There is a potential for serious side effects to an unborn child. Talk to your health care professional or pharmacist for more information. Do not breast-feed an infant while taking this medicine. Men are advised not to father a child while receiving this medicine. This product may contain alcohol. Ask your pharmacist or healthcare provider if this medicine contains alcohol. Be sure to tell all healthcare providers you are taking this medicine. Certain medicines, like metronidazole and disulfiram, can cause an unpleasant reaction when taken with alcohol. The reaction includes flushing, headache, nausea, vomiting, sweating, and increased thirst. The reaction can last from 30 minutes to several hours. What side effects may I notice from receiving this medicine? Side effects that you should report to your doctor or health care  professional as soon as possible: -allergic reactions like skin rash, itching or hives, swelling of the face, lips, or tongue -low blood counts - This drug may decrease the number of white blood cells, red blood cells and platelets. You may be at increased risk for infections and bleeding. -signs of infection - fever or chills, cough, sore throat, pain or difficulty passing urine -signs of decreased platelets or bleeding - bruising, pinpoint red spots on the skin, black, tarry stools, nosebleeds -signs of decreased red blood cells - unusually weak or tired, fainting spells, lightheadedness -breathing problems -chest pain -high or low blood pressure -mouth sores -nausea and vomiting -pain, swelling, redness or irritation at the injection site -pain, tingling, numbness in the hands or feet -slow or irregular heartbeat -swelling of the ankle, feet, hands Side effects that usually do not require medical attention (report to your doctor or health care professional if they continue or are bothersome): -bone pain -complete hair loss including hair on your head, underarms, pubic hair, eyebrows, and eyelashes -changes in the color of fingernails -diarrhea -loosening of the fingernails -loss of appetite -muscle or joint pain -red flush to  skin -sweating This list may not describe all possible side effects. Call your doctor for medical advice about side effects. You may report side effects to FDA at 1-800-FDA-1088. Where should I keep my medicine? This drug is given in a hospital or clinic and will not be stored at home. NOTE: This sheet is a summary. It may not cover all possible information. If you have questions about this medicine, talk to your doctor, pharmacist, or health care provider.    2016, Elsevier/Gold Standard. (2015-06-18 13:02:56)

## 2015-11-20 NOTE — Progress Notes (Signed)
[facility] [place] NOTE  Patient Care Team: Lajean Manes, MD as PCP - General  CHIEF COMPLAINTS/PURPOSE OF CONSULTATION:  Invasive ductal carcinoma triple positive  ER+, PR+, HER 2 neu +  RIGHT breast cancer, upper inner quadrant Screening mammogram on 09/04/2015 with possible R breast mass 2 densities noted in the R breast upper inner quadrant within a centimeter of each other (9 and 10 cm from the nipple) 11/03/2015 partial mastectomy, sentinel node biopsy and port placement with Dr. Brantley Stage Final pathology [id number] ER+PR+ Her 2 neu+ R breast carcinoma   HISTORY OF PRESENTING ILLNESS:  Lori Greene 75 y.o. female is here because of abnormal mammogram of the R breast and newly diagnosed R breast cancer.  Lori Greene returns to the [facility] today with her husband for follow-up on her cancer diagnosis.   She has a lot of questions about her future cancer treatments, and appreciates the discussion during the appointment today. She says that after she was here Tuesday, she "went back and read the book some more," which answered some questions. She writes down her questions and concerns and seems very prepared to manage herself during this process.  She has a minor concern about how her port is still "touchy," but was reassured that this can be taken care of.   MEDICAL HISTORY:  Past Medical History  Diagnosis Date  . Hypertension   . Diabetes mellitus without complication ([place])   . Asthma   . GERD (gastroesophageal reflux disease)   . Cancer [facility]) 2016    right breast    SURGICAL HISTORY: Past Surgical History  Procedure Laterality Date  . Abdominal hysterectomy  1983  . Nasal fracture surgery    . Breast lumpectomy with radioactive seed and sentinel lymph node biopsy Right 11/03/2015    Procedure: RIGHT BREAST LUMPECTOMY WITH RADIOACTIVE SEED AND SENTINEL LYMPH NODE MAPPING;  Surgeon: Erroll Luna, MD;  Location: [facility];  Service: General;  Laterality: Right;  . Portacath placement Right 11/03/2015    Procedure: INSERTION PORT-A-CATH;  Surgeon: Erroll Luna, MD;  Location: [facility];  Service: General;  Laterality: Right;    SOCIAL HISTORY: Social History   Social History  . Marital Status: Married    Spouse Name: N/A  . Number of Children: N/A  . Years of Education: N/A   Occupational History  . retired    Social History Main Topics  . Smoking status: Never Smoker   . Smokeless tobacco: Never Used  . Alcohol Use: No  . Drug Use: No  . Sexual Activity: No   Other Topics Concern  . Not on file   Social History Narrative  Married 51 years. 0 children. Fostered children. 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 She likes to sew, knit, and quilt. She likes to walk but does not do it often. Non smoker ETOH, none She is from [facility] and moved to [place] when she got married.  FAMILY HISTORY: Family History  Problem Relation Age of Onset  . Breast cancer Mother   . Leukemia Father    has no family status information on file.  Father died at 70 yo of leukemia Mother died of old age; breast cancer diagnosis at 74 yo. She had a biopsy, no treatment. 1 sister, healthy. No breast cancer.  ALLERGIES:  is allergic to losartan and penicillins.  MEDICATIONS:  Current Outpatient Prescriptions  Medication Sig Dispense Refill  .  albuterol (PROAIR HFA) 108 (90 BASE) MCG/ACT inhaler Inhale 2 puffs into the lungs every 6 (six) hours as needed for wheezing or shortness of breath.    Marland Kitchen atorvastatin (LIPITOR) 10 MG tablet Take 10 mg by mouth daily.    . budesonide-formoterol (SYMBICORT) 80-4.5 MCG/ACT inhaler Inhale 2 puffs into the lungs 2 (two) times daily.    . Cholecalciferol (VITAMIN D3) 2000 units TABS Take by mouth.    . dextromethorphan-guaiFENesin (MUCINEX DM) 30-600 MG 12hr tablet Take 1 tablet by mouth 2 (two) times daily. Reported  on 12/31/2015    . famotidine (PEPCID) 20 MG tablet Take 20 mg by mouth 2 (two) times daily.    Marland Kitchen lisinopril (PRINIVIL,ZESTRIL) 5 MG tablet Take 5 mg by mouth daily.    . metFORMIN (GLUCOPHAGE) 500 MG tablet Take 500 mg by mouth daily with breakfast.     . montelukast (SINGULAIR) 10 MG tablet     . omeprazole (PRILOSEC) 40 MG capsule Take 40 mg by mouth daily.    Marland Kitchen PACLitaxel (TAXOL) 300 MG/50ML injection Inject into the vein. To start weekly Taxol x 12 weeks on January 12.    . Trastuzumab (HERCEPTIN IV) Inject into the vein. To start on November 26, 2015.    . Calcium Carbonate-Vitamin D (CALCIUM + D PO) Take 1 tablet by mouth every morning.    . lidocaine-prilocaine (EMLA) cream Apply a quarter size amount to port site 1 hour prior to chemo. Do not rub in. Cover with plastic wrap. 30 g 3  . ondansetron (ZOFRAN) 8 MG tablet Take 1 tablet (8 mg total) by mouth every 8 (eight) hours as needed for nausea or vomiting. 30 tablet 2  . prochlorperazine (COMPAZINE) 10 MG tablet Take 1 tablet (10 mg total) by mouth every 6 (six) hours as needed for nausea or vomiting. 30 tablet 2   No current facility-administered medications for this visit.    Review of Systems  Constitutional: Negative.  Negative for fever, chills, weight loss and malaise/fatigue.  HENT: Negative.  Negative for congestion, hearing loss, nosebleeds, sore throat and tinnitus.   Eyes: Negative.  Negative for blurred vision, double vision, pain and discharge.  Respiratory: Negative.  Negative for cough, hemoptysis, sputum production, shortness of breath and wheezing.   Cardiovascular: Negative.  Negative for chest pain, palpitations, claudication, leg swelling and PND.  Gastrointestinal: Negative.  Negative for heartburn, nausea, vomiting, abdominal pain, diarrhea, constipation, blood in stool and melena.  Genitourinary: Negative.  Negative for dysuria, urgency, frequency and hematuria.  Musculoskeletal: Negative.  Negative for  myalgias, joint pain and falls.  Skin: Negative.  Negative for itching and rash.  Neurological: Negative.  Negative for dizziness, tingling, tremors, sensory change, speech change, focal weakness, seizures, loss of consciousness, weakness and headaches.  Endo/Heme/Allergies: Negative.  Does not bruise/bleed easily.  Psychiatric/Behavioral: Negative.  Negative for depression, suicidal ideas, memory loss and substance abuse. The patient is not nervous/anxious and does not have insomnia.   All other systems reviewed and are negative.  14 point ROS was done and is otherwise as detailed above or in HPI   PHYSICAL EXAMINATION: ECOG PERFORMANCE STATUS: 0 - Asymptomatic  There were no vitals filed for this visit. There were no vitals filed for this visit.  Physical Exam  Constitutional: She is oriented to person, place, and time and well-developed, well-nourished, and in no distress.  HENT:  Head: Normocephalic and atraumatic.  Nose: Nose normal.  Mouth/Throat: Oropharynx is clear and moist. No oropharyngeal exudate.  Eyes: Conjunctivae and EOM are normal. Pupils are equal, round, and reactive to light. Right eye exhibits no discharge. Left eye exhibits no discharge. No scleral icterus.  Neck: Normal range of motion. Neck supple. No tracheal deviation present. No thyromegaly present.  Cardiovascular: Normal rate, regular rhythm and normal heart sounds.  Exam reveals no gallop and no friction rub.   No murmur heard. Pulmonary/Chest: Effort normal. She has rare wheezes. She has no rales.  Abdominal: Soft. Bowel sounds are normal. She exhibits no distension and no mass. There is no tenderness. There is no rebound and no guarding.  Musculoskeletal: Normal range of motion. She exhibits no edema.  Lymphadenopathy:    She has no cervical adenopathy.  Neurological: She is alert and oriented to person, place, and time. She has normal reflexes. No cranial nerve deficit. Gait normal. Coordination normal.   Skin: Skin is warm and dry. No rash noted.  Psychiatric: Mood, memory, affect and judgment normal.  Nursing note and vitals reviewed.   LABORATORY DATA:  I have reviewed the data as listed Lab Results  Component Value Date   WBC 5.7 12/31/2015   HGB 10.6* 12/31/2015   HCT 31.9* 12/31/2015   MCV 94.4 12/31/2015   PLT 302 12/31/2015   CMP     Component Value Date/Time   NA 139 12/31/2015 1020   K 3.9 12/31/2015 1020   CL 106 12/31/2015 1020   CO2 26 12/31/2015 1020   GLUCOSE 210* 12/31/2015 1020   BUN 11 12/31/2015 1020   CREATININE 0.92 12/31/2015 1020   CALCIUM 8.4* 12/31/2015 1020   PROT 6.1* 12/31/2015 1020   ALBUMIN 3.6 12/31/2015 1020   AST 23 12/31/2015 1020   ALT 16 12/31/2015 1020   ALKPHOS 91 12/31/2015 1020   BILITOT 0.5 12/31/2015 1020   GFRNONAA >60 12/31/2015 1020   GFRAA >60 12/31/2015 1020     RADIOGRAPHIC STUDIES: I have personally reviewed the radiological images as listed and agreed with the findings in the report.  CLINICAL DATA: Post biopsy mammogram of the right breast for clip placement.  EXAM: DIAGNOSTIC RIGHT MAMMOGRAM POST ULTRASOUND BIOPSY  COMPARISON: Previous exam(s).  FINDINGS: Mammographic images were obtained following ultrasound guided biopsy of 2 small masses in the upper inner quadrant of the right breast. A coil shaped biopsy marking clip and a ribbon shaped biopsy marking clip are appropriately positioned at the intended site of biopsy in the right breast at approximately 1 o'clock, at 9 and 10 cm from the nipple respectively.  IMPRESSION: Appropriate positioning of the 2 biopsy marking clips in the upper-inner quadrant of the right breast at 1 o'clock.  Final Assessment: Post Procedure Mammograms for Marker Placement   Electronically Signed  By: Ammie Ferrier M.D.  On: 09/21/2015 16:16   ASSESSMENT & PLAN:  Invasive ductal carcinoma triple positive  ER+ (100%), PR+ (10%), HER 2 neu +  RIGHT  breast cancer, upper inner quadrant Screening mammogram on 09/04/2015 with possible R breast mass 2 densities noted in the R breast upper inner quadrant within a centimeter of each other (9 and 10 cm from the nipple) 11/03/2015 partial mastectomy, sentinel node biopsy and port placement with Dr. Brantley Stage Final pathology [id number] ER+PR+ Her 2 neu+ R breast carcinoma   I spoke with her at length about the nature of her future therapy, including radiation, Taxol & Herceptin. We also discussed the side-effects and symptoms associated with these drugs. As she is so fit and healthy, we also discussed why I believe her  current plan is a good choice and I believe she will do quite well. She wished to review benefits of therapy and we spent time today reviewing risks of disease recurrence with and without treatment.   She meets with Hildred Alamin for teaching on Tuesday. Hildred Alamin will give her materials at their consult, and Lori Greene knows she can call the clinic with any problems, misunderstandings, or concerns that come up. I advised her that she will also receive a treatment calendar. She had questions regarding frequency of visits with [place] during her chemotherapy and I advised her that initially they will be weekly.  All questions were answered. The patient knows to call the clinic with any problems, questions or concerns.  This note was electronically signed.   This document serves as a record of services personally performed by Ancil Linsey, MD. It was created on her behalf by Toni Amend, a trained medical scribe. The creation of this record is based on the scribe's personal observations and the provider's statements to them. This document has been checked and approved by the attending provider.  I have reviewed the above documentation for accuracy and completeness, and I agree with the above.   Molli Hazard, MD  01/01/2016 8:09 PM

## 2015-11-20 NOTE — Patient Instructions (Signed)
[place]   CHEMOTHERAPY INSTRUCTIONS  Premeds: Zofran - for nausea/vomiting prevention/reduction. Dexamethasone - steroid - given to reduce the risk of you having an allergic type reaction to the Taxol. Dex can cause you to feel energized, nervous/anxious/jittery, make you have trouble sleeping, and/or make you feel hot/flushed in the face/neck and/or look pink/red in the face/neck. These side effects will pass as the Dex wears off. Benadryl - antihistamine - given to reduce the risk of you having an allergic reaction to the Taxol. Pepcid - a different type of antihistamine - given to reduce the risk of you having an allergic reaction to the Taxol. (premeds will take 1 hour to infuse). Prior to taking Herceptin you will receive: Tylenol to reduce the chance of developing fever, and Benadryl tablet - to reduce the chance of developing chills, allergic type reaction to the Herceptin. As long as you take Taxol & Herceptin together you will only receive the Benadryl via the IV route.   Taxol - the first time you receive this drug we will titrate it very slowly to ensure that you do not have or are not having an allergic reaction to the chemo. Side Effects: hair loss, lowers your white blood cells (fight infection), muscle aches, nausea/vomiting, irritation to the mouth (mouth sores, pain in your mouth) *neuropathy - numbness/tingling/burning in hands/fingers/feet/toes. We need to know as soon as this begins to happen so that we can monitor it and treat if necessary. The numbness generally begins in the fingertips of tips of toes and then begins to travel up the finger/toe/hand/foot. We never want you getting to where you can't pick up a pen, coin, zip a zipper, button a button, or have trouble walking. You must tell us immediately if you are experiencing peripheral neuropathy! (the first time you receive this, it will take approximately 3 hours, after that it will only take 1  hour) You will receive this drug weekly x 12 weeks and then you will be finished with Taxol.    Herceptin - this is given to patients who overexpress HER2. Herceptin works on the surface of the cancer cell by blocking the chemical signals that can stimulate this uncontrolled growth. Some breast cancer cells make (overexpress) too many copies of a particular gene known as HER2. The HER2 gene makes a protein known as a HER2 receptor. HER2 receptors are like ears, or antennae, on the surface of all cells. These HER2 receptors receive signals that stimulate the cell to grow and multiply. But breast cancer cells with too many HER2 receptors can pick up too many growth signals and so start growing and multiplying too much and too fast. Breast cancer cells that overexpress the HER2 gene are said to be HER2-positive. Herceptin works by attaching itself to the HER2 receptors on the surface of breast cancer cells and blocking them from receiving growth signals. By blocking the signals, Herceptin can slow or stop the growth of the breast cancer. Herceptin is an example of an immune targeted therapy. In addition to blocking HER2 receptors, Herceptin can also help fight breast cancer by alerting the immune system to destroy cancer cells onto which it is attached.   You will receive this drug weekly with Taxol and then when you are finished with Taxol, you will only receive this drug once every 21 days until you complete your year of treatment with Herceptin.    Side Effects that may occur during infusion include: chills, fever, headache, dizziness,  shortness of breath, low blood pressure, rash.   Herceptin causes flu-like symptoms in about 40% of the people who take it. These symptoms may include: fever  chills  muscle aches  nausea   We will have to perform MUGA scans or 2D echoes periodically during treatment because a side effect of this drug can be cardiotoxicity (weakening of the pumping muscle of the  heart)  The first time you receive this it will take 90 minutes, second time 60 minutes, and the third time and thereafter 30 minutes.   POTENTIAL SIDE EFFECTS OF TREATMENT: Increased Susceptibility to Infection, Vomiting, Constipation, Hair Thinning, Bone Marrow Suppression, Abdominal Cramping, Complete Hair Loss, Nausea, Diarrhea and Mouth Sores   SELF IMAGE NEEDS AND REFERRALS MADE: Obtain hair accessories as soon as possible (wigs, scarves, turbans,caps,etc.) Referral to Look Good, Feel Better consultant paperwork provided   EDUCATIONAL MATERIALS GIVEN AND REVIEWED: Chemotherapy and You Specific Instructions Sheets: Herceptin, Taxol, Benadryl, Dexamethasone, Pepcid, Zofran, EMLA cream, Zofran tablet, Compazine tablet   SELF CARE ACTIVITIES WHILE ON CHEMOTHERAPY: Increase your fluid intake 48 hours prior to treatment and drink at least 2 quarts per day after treatment., No alcohol intake., No aspirin or other medications unless approved by your oncologist., Eat foods that are light and easy to digest., Eat foods at cold or room temperature., No fried, fatty, or spicy foods immediately before or after treatment., Have teeth cleaned professionally before starting treatment. Keep dentures and partial plates clean., Use soft toothbrush and do not use mouthwashes that contain alcohol. Biotene is a good mouthwash that is available at most pharmacies or may be ordered by calling [phone]., Use warm salt water gargles (1 teaspoon salt per 1 quart warm water) before and after meals and at bedtime. Or you may rinse with 2 tablespoons of three -percent hydrogen peroxide mixed in eight ounces of water., Always use sunscreen with SPF (Sun Protection Factor) of 30 or higher., Use your nausea medication as directed to prevent nausea., Use your stool softener or laxative as directed to prevent constipation. and Use your anti-diarrheal medication as directed to stop diarrhea.  Please wash your hands for  at least 30 seconds using warm soapy water. Handwashing is the #1 way to prevent the spread of germs. Stay away from sick people or people who are getting over a cold. If you develop respiratory systems such as green/yellow mucus production or productive cough or persistent cough let us know and we will see if you need an antibiotic. It is a good idea to keep a pair of gloves on when going into grocery stores/[employer] to decrease your risk of coming into contact with germs on the carts, etc. Carry alcohol hand gel with you at all times and use it frequently if out in public. All foods need to be cooked thoroughly. No raw foods. No medium or undercooked meats, eggs. If your food is cooked medium well, it does not need to be hot pink or saturated with bloody liquid at all. Vegetables and fruits need to be washed/rinsed under the faucet with a dish detergent before being consumed. You can eat raw fruits and vegetables unless we tell you otherwise but it would be best if you cooked them or bought frozen. Do not eat off of salad bars or hot bars unless you really trust the cleanliness of the restaurant. If you need dental work, please let Dr. Whitney Muse know before you go for your appointment so that we can coordinate the best possible time  for you in regards to your chemo regimen. You need to also let your dentist know that you are actively taking chemo. We may need to do labs prior to your dental appointment. We also want your bowels moving at least every other day. If this is not happening, we need to know so that we can get you on a bowel regimen to help you go. If you are going to have sex your partner must wear a condom. This is to prevent them from potential chemotherapy exposure. This should continue for 28 days past the completion of chemo.      MEDICATIONS: You have been given prescriptions for the following medications:  Zofran/Ondansetron '8mg'$  tablet. Take 1 tablet every 8 hours as needed for  nausea/vomiting. (#1 nausea med to take, this can constipate)  Compazine/Prochlorperazine '10mg'$  tablet. Take 1 tablet every 6 hours as needed for nausea/vomiting. (#2 nausea med to take, this can make you sleepy)  EMLA cream. Apply a quarter size amount to port site 1 hour prior to chemo. Do not rub in. Cover with plastic wrap.   Over-the-Counter Meds:  Miralax 1 capful in 8 oz of fluid daily. May increase to two times a day if needed. This is a stool softener. If this doesn't work proceed you can add:  Senokot S  - start with 1 tablet two times a day and increase to 4 tablets two times a day if needed. (total of 8 tablets in a 24 hour period). This is a stimulant laxative.   Call us if this does not help your bowels move.   Imodium '2mg'$  capsule. Take 2 capsules after the 1st loose stool and then 1 capsule every 2 hours until you go a total of 12 hours without having a loose stool. Call the [facility] if loose stools continue. If diarrhea occurs @ bedtime, take 2 capsules @ bedtime. Then take 2 capsules every 4 hours until morning. Call [facility].   SYMPTOMS TO REPORT AS SOON AS POSSIBLE AFTER TREATMENT:  FEVER GREATER THAN 100.5 F  CHILLS WITH OR WITHOUT FEVER  NAUSEA AND VOMITING THAT IS NOT CONTROLLED WITH YOUR NAUSEA MEDICATION  UNUSUAL SHORTNESS OF BREATH  UNUSUAL BRUISING OR BLEEDING  TENDERNESS IN MOUTH AND THROAT WITH OR WITHOUT PRESENCE OF ULCERS  URINARY PROBLEMS  BOWEL PROBLEMS  UNUSUAL RASH    Wear comfortable clothing and clothing appropriate for easy access to any Portacath or PICC line. Let us know if there is anything that we can do to make your therapy better!      I have been informed and understand all of the instructions given to me and have received a copy. I have been instructed to call the clinic [phone] or my family physician as soon as possible for continued medical care, if indicated. I do not have any more questions at this time but  understand that I may call the [facility] or the Patient Navigator at [phone] during office hours should I have questions or need assistance in obtaining follow-up care.         Trastuzumab injection for infusion What is this medicine? TRASTUZUMAB (tras TOO zoo mab) is a monoclonal antibody. It is used to treat breast cancer and stomach cancer. This medicine may be used for other purposes; ask your health care provider or pharmacist if you have questions. What should I tell my health care provider before I take this medicine? They need to know if you have any of these  conditions: -heart disease -heart failure -infection (especially a virus infection such as chickenpox, cold sores, or herpes) -lung or breathing disease, like asthma -recent or ongoing radiation therapy -an unusual or allergic reaction to trastuzumab, benzyl alcohol, or other medications, foods, dyes, or preservatives -pregnant or trying to get pregnant -breast-feeding How should I use this medicine? This drug is given as an infusion into a vein. It is administered in a hospital or clinic by a specially trained health care professional. Talk to your pediatrician regarding the use of this medicine in children. This medicine is not approved for use in children. Overdosage: If you think you have taken too much of this medicine contact a poison control center or emergency room at once. NOTE: This medicine is only for you. Do not share this medicine with others. What if I miss a dose? It is important not to miss a dose. Call your doctor or health care professional if you are unable to keep an appointment. What may interact with this medicine? -doxorubicin -warfarin This list may not describe all possible interactions. Give your health care provider a list of all the medicines, herbs, non-prescription drugs, or dietary supplements you use. Also tell them if you smoke, drink alcohol, or use illegal drugs. Some items  may interact with your medicine. What should I watch for while using this medicine? Visit your doctor for checks on your progress. Report any side effects. Continue your course of treatment even though you feel ill unless your doctor tells you to stop. Call your doctor or health care professional for advice if you get a fever, chills or sore throat, or other symptoms of a cold or flu. Do not treat yourself. Try to avoid being around people who are sick. You may experience fever, chills and shaking during your first infusion. These effects are usually mild and can be treated with other medicines. Report any side effects during the infusion to your health care professional. Fever and chills usually do not happen with later infusions. Do not become pregnant while taking this medicine or for 7 months after stopping it. Women should inform their doctor if they wish to become pregnant or think they might be pregnant. Women of child-bearing potential will need to have a negative pregnancy test before starting this medicine. There is a potential for serious side effects to an unborn child. Talk to your health care professional or pharmacist for more information. Do not breast-feed an infant while taking this medicine or for 7 months after stopping it. Women must use effective birth control with this medicine. What side effects may I notice from receiving this medicine? Side effects that you should report to your doctor or other health care professional as soon as possible: -breathing difficulties -chest pain or palpitations -cough -dizziness or fainting -fever or chills, sore throat -skin rash, itching or hives -swelling of the legs or ankles -unusually weak or tired Side effects that usually do not require medical attention (report to your doctor or other health care professional if they continue or are bothersome): -loss of appetite -headache -muscle aches -nausea This list may not describe all  possible side effects. Call your doctor for medical advice about side effects. You may report side effects to FDA at 1-800-FDA-1088. Where should I keep my medicine? This drug is given in a hospital or clinic and will not be stored at home. NOTE: This sheet is a summary. It may not cover all possible information. If you have questions about this medicine,  talk to your doctor, pharmacist, or health care provider.    2016, Elsevier/Gold Standard. (2015-02-06 11:49:32) Paclitaxel injection What is this medicine? PACLITAXEL (PAK li TAX el) is a chemotherapy drug. It targets fast dividing cells, like cancer cells, and causes these cells to die. This medicine is used to treat ovarian cancer, breast cancer, and other cancers. This medicine may be used for other purposes; ask your health care provider or pharmacist if you have questions. What should I tell my health care provider before I take this medicine? They need to know if you have any of these conditions: -blood disorders -irregular heartbeat -infection (especially a virus infection such as chickenpox, cold sores, or herpes) -liver disease -previous or ongoing radiation therapy -an unusual or allergic reaction to paclitaxel, alcohol, polyoxyethylated castor oil, other chemotherapy agents, other medicines, foods, dyes, or preservatives -pregnant or trying to get pregnant -breast-feeding How should I use this medicine? This drug is given as an infusion into a vein. It is administered in a hospital or clinic by a specially trained health care professional. Talk to your pediatrician regarding the use of this medicine in children. Special care may be needed. Overdosage: If you think you have taken too much of this medicine contact a poison control center or emergency room at once. NOTE: This medicine is only for you. Do not share this medicine with others. What if I miss a dose? It is important not to miss your dose. Call your doctor or health  care professional if you are unable to keep an appointment. What may interact with this medicine? Do not take this medicine with any of the following medications: -disulfiram -metronidazole This medicine may also interact with the following medications: -cyclosporine -diazepam -ketoconazole -medicines to increase blood counts like filgrastim, pegfilgrastim, sargramostim -other chemotherapy drugs like cisplatin, doxorubicin, epirubicin, etoposide, teniposide, vincristine -quinidine -testosterone -vaccines -verapamil Talk to your doctor or health care professional before taking any of these medicines: -acetaminophen -aspirin -ibuprofen -ketoprofen -naproxen This list may not describe all possible interactions. Give your health care provider a list of all the medicines, herbs, non-prescription drugs, or dietary supplements you use. Also tell them if you smoke, drink alcohol, or use illegal drugs. Some items may interact with your medicine. What should I watch for while using this medicine? Your condition will be monitored carefully while you are receiving this medicine. You will need important blood work done while you are taking this medicine. This drug may make you feel generally unwell. This is not uncommon, as chemotherapy can affect healthy cells as well as cancer cells. Report any side effects. Continue your course of treatment even though you feel ill unless your doctor tells you to stop. This medicine can cause serious allergic reactions. To reduce your risk you will need to take other medicine(s) before treatment with this medicine. In some cases, you may be given additional medicines to help with side effects. Follow all directions for their use. Call your doctor or health care professional for advice if you get a fever, chills or sore throat, or other symptoms of a cold or flu. Do not treat yourself. This drug decreases your body's ability to fight infections. Try to avoid being  around people who are sick. This medicine may increase your risk to bruise or bleed. Call your doctor or health care professional if you notice any unusual bleeding. Be careful brushing and flossing your teeth or using a toothpick because you may get an infection or bleed more easily. If  you have any dental work done, tell your dentist you are receiving this medicine. Avoid taking products that contain aspirin, acetaminophen, ibuprofen, naproxen, or ketoprofen unless instructed by your doctor. These medicines may hide a fever. Do not become pregnant while taking this medicine. Women should inform their doctor if they wish to become pregnant or think they might be pregnant. There is a potential for serious side effects to an unborn child. Talk to your health care professional or pharmacist for more information. Do not breast-feed an infant while taking this medicine. Men are advised not to father a child while receiving this medicine. This product may contain alcohol. Ask your pharmacist or healthcare provider if this medicine contains alcohol. Be sure to tell all healthcare providers you are taking this medicine. Certain medicines, like metronidazole and disulfiram, can cause an unpleasant reaction when taken with alcohol. The reaction includes flushing, headache, nausea, vomiting, sweating, and increased thirst. The reaction can last from 30 minutes to several hours. What side effects may I notice from receiving this medicine? Side effects that you should report to your doctor or health care professional as soon as possible: -allergic reactions like skin rash, itching or hives, swelling of the face, lips, or tongue -low blood counts - This drug may decrease the number of white blood cells, red blood cells and platelets. You may be at increased risk for infections and bleeding. -signs of infection - fever or chills, cough, sore throat, pain or difficulty passing urine -signs of decreased platelets or  bleeding - bruising, pinpoint red spots on the skin, black, tarry stools, nosebleeds -signs of decreased red blood cells - unusually weak or tired, fainting spells, lightheadedness -breathing problems -chest pain -high or low blood pressure -mouth sores -nausea and vomiting -pain, swelling, redness or irritation at the injection site -pain, tingling, numbness in the hands or feet -slow or irregular heartbeat -swelling of the ankle, feet, hands Side effects that usually do not require medical attention (report to your doctor or health care professional if they continue or are bothersome): -bone pain -complete hair loss including hair on your head, underarms, pubic hair, eyebrows, and eyelashes -changes in the color of fingernails -diarrhea -loosening of the fingernails -loss of appetite -muscle or joint pain -red flush to skin -sweating This list may not describe all possible side effects. Call your doctor for medical advice about side effects. You may report side effects to FDA at 1-800-FDA-1088. Where should I keep my medicine? This drug is given in a hospital or clinic and will not be stored at home. NOTE: This sheet is a summary. It may not cover all possible information. If you have questions about this medicine, talk to your doctor, pharmacist, or health care provider.    2016, Elsevier/Gold Standard. (2015-06-18 13:02:56)

## 2015-11-24 ENCOUNTER — Encounter (HOSPITAL_BASED_OUTPATIENT_CLINIC_OR_DEPARTMENT_OTHER): Payer: Medicare Other

## 2015-11-24 DIAGNOSIS — C50211 Malignant neoplasm of upper-inner quadrant of right female breast: Secondary | ICD-10-CM

## 2015-11-24 MED ORDER — AZITHROMYCIN 250 MG PO TABS
ORAL_TABLET | ORAL | Status: DC
Start: 2015-11-24 — End: 2015-12-17

## 2015-11-24 NOTE — Progress Notes (Signed)
Chemo teaching done and consent signed for Taxol and Herceptin. Distress screening to be filled out on Thursday (1st day of tx). Z pak ordered for pt per Dr. Whitney Muse due to congested cough. Pt signed up for [facility] by Lupita Raider RN. Pt's Zofran is still in PA status.

## 2015-11-25 ENCOUNTER — Ambulatory Visit (HOSPITAL_COMMUNITY): Payer: Medicare Other | Admitting: Hematology & Oncology

## 2015-11-26 ENCOUNTER — Encounter (HOSPITAL_BASED_OUTPATIENT_CLINIC_OR_DEPARTMENT_OTHER): Payer: Medicare Other

## 2015-11-26 VITALS — BP 145/72 | HR 89 | Temp 98.2°F | Resp 16

## 2015-11-26 DIAGNOSIS — C50211 Malignant neoplasm of upper-inner quadrant of right female breast: Secondary | ICD-10-CM | POA: Diagnosis not present

## 2015-11-26 DIAGNOSIS — Z5112 Encounter for antineoplastic immunotherapy: Secondary | ICD-10-CM | POA: Diagnosis not present

## 2015-11-26 DIAGNOSIS — Z5111 Encounter for antineoplastic chemotherapy: Secondary | ICD-10-CM | POA: Diagnosis not present

## 2015-11-26 LAB — CBC WITH DIFFERENTIAL/PLATELET
Basophils Absolute: 0.2 10*3/uL — ABNORMAL HIGH (ref 0.0–0.1)
Basophils Relative: 2 %
EOS ABS: 0.5 10*3/uL (ref 0.0–0.7)
EOS PCT: 6 %
HCT: 36.1 % (ref 36.0–46.0)
Hemoglobin: 11.7 g/dL — ABNORMAL LOW (ref 12.0–15.0)
LYMPHS ABS: 2 10*3/uL (ref 0.7–4.0)
Lymphocytes Relative: 23 %
MCH: 30.4 pg (ref 26.0–34.0)
MCHC: 32.4 g/dL (ref 30.0–36.0)
MCV: 93.8 fL (ref 78.0–100.0)
MONOS PCT: 7 %
Monocytes Absolute: 0.6 10*3/uL (ref 0.1–1.0)
Neutro Abs: 5.5 10*3/uL (ref 1.7–7.7)
Neutrophils Relative %: 62 %
PLATELETS: 259 10*3/uL (ref 150–400)
RBC: 3.85 MIL/uL — ABNORMAL LOW (ref 3.87–5.11)
RDW: 12.6 % (ref 11.5–15.5)
WBC: 8.8 10*3/uL (ref 4.0–10.5)

## 2015-11-26 LAB — COMPREHENSIVE METABOLIC PANEL
ALK PHOS: 111 U/L (ref 38–126)
ALT: 12 U/L — AB (ref 14–54)
AST: 20 U/L (ref 15–41)
Albumin: 3.6 g/dL (ref 3.5–5.0)
Anion gap: 5 (ref 5–15)
BUN: 8 mg/dL (ref 6–20)
CALCIUM: 8.9 mg/dL (ref 8.9–10.3)
CHLORIDE: 107 mmol/L (ref 101–111)
CO2: 27 mmol/L (ref 22–32)
CREATININE: 0.95 mg/dL (ref 0.44–1.00)
GFR, EST NON AFRICAN AMERICAN: 58 mL/min — AB (ref >60)
Glucose, Bld: 172 mg/dL — ABNORMAL HIGH (ref 65–99)
Potassium: 3.7 mmol/L (ref 3.5–5.1)
SODIUM: 139 mmol/L (ref 135–145)
Total Bilirubin: 0.8 mg/dL (ref 0.3–1.2)
Total Protein: 6.2 g/dL — ABNORMAL LOW (ref 6.5–8.1)

## 2015-11-26 MED ORDER — TRASTUZUMAB CHEMO INJECTION 440 MG
4.0000 mg/kg | Freq: Once | INTRAVENOUS | Status: AC
  Administered 2015-11-26: 210 mg via INTRAVENOUS
  Filled 2015-11-26: qty 10

## 2015-11-26 MED ORDER — FAMOTIDINE IN NACL 20-0.9 MG/50ML-% IV SOLN
20.0000 mg | Freq: Once | INTRAVENOUS | Status: AC
  Administered 2015-11-26: 20 mg via INTRAVENOUS

## 2015-11-26 MED ORDER — HEPARIN SOD (PORK) LOCK FLUSH 100 UNIT/ML IV SOLN
500.0000 [IU] | Freq: Once | INTRAVENOUS | Status: AC | PRN
  Administered 2015-11-26: 500 [IU]

## 2015-11-26 MED ORDER — DIPHENHYDRAMINE HCL 50 MG/ML IJ SOLN
INTRAMUSCULAR | Status: AC
  Filled 2015-11-26: qty 1

## 2015-11-26 MED ORDER — SODIUM CHLORIDE 0.9 % IV SOLN
Freq: Once | INTRAVENOUS | Status: AC
  Administered 2015-11-26: 11:00:00 via INTRAVENOUS
  Filled 2015-11-26: qty 4

## 2015-11-26 MED ORDER — SODIUM CHLORIDE 0.9 % IJ SOLN
10.0000 mL | INTRAMUSCULAR | Status: DC | PRN
  Administered 2015-11-26: 10 mL
  Filled 2015-11-26: qty 10

## 2015-11-26 MED ORDER — ACETAMINOPHEN 325 MG PO TABS
650.0000 mg | ORAL_TABLET | Freq: Once | ORAL | Status: AC
  Administered 2015-11-26: 650 mg via ORAL

## 2015-11-26 MED ORDER — FAMOTIDINE IN NACL 20-0.9 MG/50ML-% IV SOLN
INTRAVENOUS | Status: AC
  Filled 2015-11-26: qty 50

## 2015-11-26 MED ORDER — DIPHENHYDRAMINE HCL 50 MG/ML IJ SOLN
50.0000 mg | Freq: Once | INTRAMUSCULAR | Status: AC
  Administered 2015-11-26: 50 mg via INTRAVENOUS

## 2015-11-26 MED ORDER — DEXTROSE 5 % IV SOLN
80.0000 mg/m2 | Freq: Once | INTRAVENOUS | Status: AC
  Administered 2015-11-26: 120 mg via INTRAVENOUS
  Filled 2015-11-26: qty 20

## 2015-11-26 MED ORDER — SODIUM CHLORIDE 0.9 % IV SOLN
Freq: Once | INTRAVENOUS | Status: AC
  Administered 2015-11-26: 11:00:00 via INTRAVENOUS

## 2015-11-26 MED ORDER — ACETAMINOPHEN 325 MG PO TABS
ORAL_TABLET | ORAL | Status: AC
  Filled 2015-11-26: qty 2

## 2015-11-26 NOTE — Progress Notes (Signed)
Patient tolerated infusion well.  VSS.   

## 2015-11-26 NOTE — Patient Instructions (Signed)
[facility] Discharge Instructions for Patients Receiving Chemotherapy  Today you received the following chemotherapy agents: Herceptin and Taxol. If you have any issues with nausea and/or vomiting, please refer to your information given to you by Hildred Alamin to use your at home medications as needed. Remember, we expect you to feel tired, but if you are completely unable to do anything because of fatigue, this is not normal and we need to know.   We are here to help you throughout your treatment, so please call us if you have any questions or concerns.     If you develop nausea and vomiting, or diarrhea that is not controlled by your medication, call the clinic.  The clinic phone number is [phone]. Office hours are Monday-Friday 8:30am-5:00pm.  BELOW ARE SYMPTOMS THAT SHOULD BE REPORTED IMMEDIATELY:  *FEVER GREATER THAN 101.0 F  *CHILLS WITH OR WITHOUT FEVER  NAUSEA AND VOMITING THAT IS NOT CONTROLLED WITH YOUR NAUSEA MEDICATION  *UNUSUAL SHORTNESS OF BREATH  *UNUSUAL BRUISING OR BLEEDING  TENDERNESS IN MOUTH AND THROAT WITH OR WITHOUT PRESENCE OF ULCERS  *URINARY PROBLEMS  *BOWEL PROBLEMS  UNUSUAL RASH Items with * indicate a potential emergency and should be followed up as soon as possible. If you have an emergency after office hours please contact your primary care physician or go to the nearest emergency department.  Please call the clinic during office hours if you have any questions or concerns.   You may also contact the Patient Navigator at [phone] should you have any questions or need assistance in obtaining follow up care.

## 2015-11-27 ENCOUNTER — Ambulatory Visit (HOSPITAL_COMMUNITY): Payer: Medicare Other | Admitting: Hematology & Oncology

## 2015-11-27 ENCOUNTER — Telehealth (HOSPITAL_COMMUNITY): Payer: Self-pay | Admitting: *Deleted

## 2015-11-27 NOTE — Telephone Encounter (Signed)
24h follow up: Pt is feeling a little tired from chemo. Pt has a bruise on left arm due to hitting it on the piano at house. Pt has applied ice to it. Patient had a good BM today. No nausea. Pt states she will probably go to the store and get some Boost. Pt states she will call me on Monday to let me know how she is doing.

## 2015-12-03 ENCOUNTER — Encounter (HOSPITAL_BASED_OUTPATIENT_CLINIC_OR_DEPARTMENT_OTHER): Payer: Medicare Other

## 2015-12-03 ENCOUNTER — Encounter (HOSPITAL_BASED_OUTPATIENT_CLINIC_OR_DEPARTMENT_OTHER): Payer: Medicare Other | Admitting: Hematology & Oncology

## 2015-12-03 ENCOUNTER — Encounter (HOSPITAL_COMMUNITY): Payer: Self-pay | Admitting: Hematology & Oncology

## 2015-12-03 VITALS — BP 147/67 | HR 83 | Temp 97.8°F | Resp 18 | Wt 116.6 lb

## 2015-12-03 VITALS — BP 166/77 | HR 75 | Temp 98.2°F | Resp 18

## 2015-12-03 DIAGNOSIS — Z5112 Encounter for antineoplastic immunotherapy: Secondary | ICD-10-CM

## 2015-12-03 DIAGNOSIS — Z5111 Encounter for antineoplastic chemotherapy: Secondary | ICD-10-CM | POA: Diagnosis not present

## 2015-12-03 DIAGNOSIS — C50211 Malignant neoplasm of upper-inner quadrant of right female breast: Secondary | ICD-10-CM | POA: Diagnosis not present

## 2015-12-03 DIAGNOSIS — Z170 Estrogen receptor positive status [ER+]: Secondary | ICD-10-CM

## 2015-12-03 LAB — COMPREHENSIVE METABOLIC PANEL
ALBUMIN: 3.8 g/dL (ref 3.5–5.0)
ALT: 14 U/L (ref 14–54)
ANION GAP: 7 (ref 5–15)
AST: 23 U/L (ref 15–41)
Alkaline Phosphatase: 98 U/L (ref 38–126)
BUN: 10 mg/dL (ref 6–20)
CO2: 25 mmol/L (ref 22–32)
Calcium: 9 mg/dL (ref 8.9–10.3)
Chloride: 106 mmol/L (ref 101–111)
Creatinine, Ser: 1.02 mg/dL — ABNORMAL HIGH (ref 0.44–1.00)
GFR calc Af Amer: >60 mL/min (ref >60)
GFR calc non Af Amer: 53 mL/min — ABNORMAL LOW (ref >60)
GLUCOSE: 229 mg/dL — AB (ref 65–99)
POTASSIUM: 3.8 mmol/L (ref 3.5–5.1)
SODIUM: 138 mmol/L (ref 135–145)
Total Bilirubin: 1.2 mg/dL (ref 0.3–1.2)
Total Protein: 6.3 g/dL — ABNORMAL LOW (ref 6.5–8.1)

## 2015-12-03 LAB — CBC WITH DIFFERENTIAL/PLATELET
BASOS ABS: 0.1 10*3/uL (ref 0.0–0.1)
BASOS PCT: 2 %
EOS ABS: 0.3 10*3/uL (ref 0.0–0.7)
Eosinophils Relative: 6 %
HCT: 35.9 % — ABNORMAL LOW (ref 36.0–46.0)
HEMOGLOBIN: 11.6 g/dL — AB (ref 12.0–15.0)
Lymphocytes Relative: 25 %
Lymphs Abs: 1.4 10*3/uL (ref 0.7–4.0)
MCH: 30.4 pg (ref 26.0–34.0)
MCHC: 32.3 g/dL (ref 30.0–36.0)
MCV: 94 fL (ref 78.0–100.0)
MONO ABS: 0.2 10*3/uL (ref 0.1–1.0)
MONOS PCT: 4 %
NEUTROS PCT: 63 %
Neutro Abs: 3.6 10*3/uL (ref 1.7–7.7)
Platelets: 238 10*3/uL (ref 150–400)
RBC: 3.82 MIL/uL — ABNORMAL LOW (ref 3.87–5.11)
RDW: 12.4 % (ref 11.5–15.5)
WBC: 5.7 10*3/uL (ref 4.0–10.5)

## 2015-12-03 MED ORDER — SODIUM CHLORIDE 0.9 % IJ SOLN: 10.0000 mL | INTRAMUSCULAR | Status: DC | PRN

## 2015-12-03 MED ORDER — SODIUM CHLORIDE 0.9 % IV SOLN: Freq: Once | INTRAVENOUS | Status: AC

## 2015-12-03 MED ORDER — DIPHENHYDRAMINE HCL 50 MG/ML IJ SOLN
50.0000 mg | Freq: Once | INTRAMUSCULAR | Status: AC
  Administered 2015-12-03: 50 mg via INTRAVENOUS

## 2015-12-03 MED ORDER — DIPHENHYDRAMINE HCL 50 MG/ML IJ SOLN
INTRAMUSCULAR | Status: AC
  Filled 2015-12-03: qty 1

## 2015-12-03 MED ORDER — PACLITAXEL CHEMO INJECTION 300 MG/50ML
80.0000 mg/m2 | Freq: Once | INTRAVENOUS | Status: AC
  Administered 2015-12-03: 120 mg via INTRAVENOUS
  Filled 2015-12-03: qty 20

## 2015-12-03 MED ORDER — SODIUM CHLORIDE 0.9 % IV SOLN
Freq: Once | INTRAVENOUS | Status: AC
  Administered 2015-12-03: 10:00:00 via INTRAVENOUS
  Filled 2015-12-03: qty 4

## 2015-12-03 MED ORDER — TRASTUZUMAB CHEMO INJECTION 440 MG
2.0000 mg/kg | Freq: Once | INTRAVENOUS | Status: AC
  Administered 2015-12-03: 105 mg via INTRAVENOUS
  Filled 2015-12-03: qty 5

## 2015-12-03 MED ORDER — SODIUM CHLORIDE 0.9 % IJ SOLN
10.0000 mL | INTRAMUSCULAR | Status: DC | PRN
  Administered 2015-12-03: 10 mL
  Filled 2015-12-03: qty 10

## 2015-12-03 MED ORDER — FAMOTIDINE IN NACL 20-0.9 MG/50ML-% IV SOLN
INTRAVENOUS | Status: AC
  Filled 2015-12-03: qty 50

## 2015-12-03 MED ORDER — SODIUM CHLORIDE 0.9 % IV SOLN
Freq: Once | INTRAVENOUS | Status: AC
Start: 2015-12-03 — End: 2015-12-03
  Administered 2015-12-03: 10:00:00 via INTRAVENOUS

## 2015-12-03 MED ORDER — ACETAMINOPHEN 325 MG PO TABS
650.0000 mg | ORAL_TABLET | Freq: Once | ORAL | Status: AC
  Administered 2015-12-03: 650 mg via ORAL
  Filled 2015-12-03: qty 2

## 2015-12-03 MED ORDER — HEPARIN SOD (PORK) LOCK FLUSH 100 UNIT/ML IV SOLN
INTRAVENOUS | Status: AC
  Filled 2015-12-03: qty 5

## 2015-12-03 MED ORDER — FAMOTIDINE IN NACL 20-0.9 MG/50ML-% IV SOLN
20.0000 mg | Freq: Once | INTRAVENOUS | Status: AC
  Administered 2015-12-03: 20 mg via INTRAVENOUS

## 2015-12-03 MED ORDER — HEPARIN SOD (PORK) LOCK FLUSH 100 UNIT/ML IV SOLN
500.0000 [IU] | Freq: Once | INTRAVENOUS | Status: AC | PRN
  Administered 2015-12-03: 500 [IU]

## 2015-12-03 NOTE — Patient Instructions (Addendum)
[place] at [facility] Discharge Instructions  RECOMMENDATIONS MADE BY THE CONSULTANT AND ANY TEST RESULTS WILL BE SENT TO YOUR REFERRING PHYSICIAN.   Exam and discussion completed by Dr Whitney Muse today Chemotherapy today as long as blood counts are good  Chemotherapy weekly Keep an eye on your bowels You may become more tired as your treatments go on Return to see the doctor in 1 week Please call the clinic if you have any questions or concerns    Thank you for choosing [place] at [facility] to provide your oncology and hematology care.  To afford each patient quality time with our provider, please arrive at least 15 minutes before your scheduled appointment time.   Beginning January 23rd 2017 lab work for the [facility] will be done in the  Main lab at [facility] on 1st floor. If you have a lab appointment with the [place] please come in thru the  Main Entrance and check in at the main information desk  You need to re-schedule your appointment should you arrive 10 or more minutes late.  We strive to give you quality time with our providers, and arriving late affects you and other patients whose appointments are after yours.  Also, if you no show three or more times for appointments you may be dismissed from the clinic at the providers discretion.     Again, thank you for choosing [facility]  Our hope is that these requests will decrease the amount of time that you wait before being seen by our physicians.       _____________________________________________________________  Should you have questions after your visit to [facility], please contact our office at [phone] between the hours of 8:30 a.m. and 4:30 p.m.  Voicemails left after 4:30 p.m. will not be returned until the following business day.  For prescription refill requests, have your pharmacy contact our office.

## 2015-12-03 NOTE — Patient Instructions (Signed)
..  [facility] Discharge Instructions for Patients Receiving Chemotherapy   Beginning January 23rd 2017 lab work for the [facility] will be done in the  Main lab at [facility] on 1st floor. If you have a lab appointment with the [place] please come in thru the  Main Entrance and check in at the main information desk   Today you received the following chemotherapy agents Taxol and herceptin  To help prevent nausea and vomiting after your treatment, we encourage you to take your nausea medication  If you develop nausea and vomiting, or diarrhea that is not controlled by your medication, call the clinic.  The clinic phone number is [phone]. Office hours are Monday-Friday 8:30am-5:00pm.  BELOW ARE SYMPTOMS THAT SHOULD BE REPORTED IMMEDIATELY:  *FEVER GREATER THAN 101.0 F  *CHILLS WITH OR WITHOUT FEVER  NAUSEA AND VOMITING THAT IS NOT CONTROLLED WITH YOUR NAUSEA MEDICATION  *UNUSUAL SHORTNESS OF BREATH  *UNUSUAL BRUISING OR BLEEDING  TENDERNESS IN MOUTH AND THROAT WITH OR WITHOUT PRESENCE OF ULCERS  *URINARY PROBLEMS  *BOWEL PROBLEMS  UNUSUAL RASH Items with * indicate a potential emergency and should be followed up as soon as possible. If you have an emergency after office hours please contact your primary care physician or go to the nearest emergency department.  Please call the clinic during office hours if you have any questions or concerns.   You may also contact the Patient Navigator at [phone] should you have any questions or need assistance in obtaining follow up care.

## 2015-12-03 NOTE — Progress Notes (Signed)
..  Lori Greene tolerated chemo well

## 2015-12-09 ENCOUNTER — Ambulatory Visit: Payer: Medicare Other | Admitting: Radiation Oncology

## 2015-12-09 NOTE — Progress Notes (Signed)
Mathews Argyle, MD [place] [place] [address]  Breast cancer of upper-inner quadrant of right female breast [facility])  CURRENT THERAPY: Weekly Paclitaxel and Herceptin beginning on 11/26/2015.  INTERVAL HISTORY: Lori Greene 75 y.o. female returns for followup of invasive ductal carcinoma of right breast, Stage IA (T1cN0M0), with multifocal disease in upper inner quadrant measuring 1.2 and 1.4 cm respectively, the largest lesion being grade 3 (smaller lesion in grade 2), ER/PR/HER2 POSITIVE.    Breast cancer of upper-inner quadrant of right female breast ([place])   09/01/2015 Mammogram BI-RADS CATEGORY 0: Incomplete. Need additional imaging evaluation and/or prior mammograms for comparison.   09/15/2015 Imaging CT chest- Right lower lobe 4 mm solid pulmonary nodule. If the patient is at high risk for bronchogenic carcinoma, follow-up chest CT at 1 year is recommended.   09/16/2015 Breast US US showing a small hypoechoic irregular mass with hyperechoic rim in the 1 o'clock location of R breast 10 cm from the nipple. Mass measures 0.7 x 0.5 x 0.6 cm. 2nd mass is identified in the 1 o'clock location 9 cm from the nipple 0.7 x 0.7 x 0.6 cm   09/16/2015 Mammogram Two suspicious masses in the 1 o'clock location of the right breast warranting tissue diagnosis.  No evidence for adenopathy.   09/21/2015 Pathology Results Estrogen Receptor: 100%, POSITIVE, STRONG STAINING INTENSITY Progesterone Receptor: 10%, POSITIVE, MODERATE STAINING INTENSITY Proliferation Marker Ki67: 10%.  HER2 - **POSITIVE**   09/21/2015 Mammogram Appropriate positioning of the 2 biopsy marking clips in the upper-inner quadrant of the right breast at 1 o'clock.   09/21/2015 Initial Biopsy Breast, right, needle core biopsy, 1:00 o'clock, 9 CMFN - INVASIVE DUCTAL CARCINOMA. - DUCTAL CARCINOMA IN SITU. - SEE COMMENT. 2. Breast, right, needle core biopsy, 1:00 o'clock, 10 CMFN - INVASIVE DUCTAL CARCINOMA. -  DUCTAL CARCINOMA IN SITU WITH    10/26/2015 Procedure Right chest porta cath placed with no adverse features.- Dr. Brantley Stage.   11/03/2015 Pathology Results MULTIFOCAL INVASIVE DUCTAL CARCINOMA.  INVASIVE TUMOR IS 1.0 MM FROM NEAREST MARGIN (INFERIOR).  HIGH GRADE DUCTAL CARCINOMA IN SITU WITH NECROSIS.  IN SITU CARCINOMA IS 0.1 MM FROM THE NEAREST MARGIN (INFERIOR MARGIN).   11/03/2015 Procedure Right lumpectomy by Dr. Brantley Stage.   11/17/2015 Initial Diagnosis Breast cancer of upper-inner quadrant of right female breast ([place])   11/21/2015 Imaging Normal left ventricular ejection fraction equal to 71.9%.   11/26/2015 -  Chemotherapy Paclitaxel weekly x 12    11/26/2015 -  Antibody Plan Herceptin weekly with Paclitaxel.   I personally reviewed and went over laboratory results with the patient.  The results are noted within this dictation.  She is tolerating therapy well.  She denies any nausea/vomiting. She denies any diarrhea, constipation.  She denies any numbness or tingling of her fingers and toes.  She is worried about her renal function, which is very stable and good.  I educated her on the calculation of EGFR and the fact that not only is her renal function tests, but also age, sex, and race.  She is educated that her renal function is stable today and WNL.   Past Medical History  Diagnosis Date  . Hypertension   . Diabetes mellitus without complication ([place])   . Asthma   . GERD (gastroesophageal reflux disease)   . Cancer [facility]) 2016    right breast    has Chest crackles; Cough; Hiatal hernia; HTN (hypertension); Lung nodule; and Breast cancer  of upper-inner quadrant of right female breast ([place]) on her problem list.     is allergic to losartan and penicillins.  Current Outpatient Prescriptions on File Prior to Visit  Medication Sig Dispense Refill  . albuterol (PROAIR HFA) 108 (90 BASE) MCG/ACT inhaler Inhale 2 puffs into the lungs every 6 (six) hours as needed for wheezing or shortness  of breath.    Marland Kitchen atorvastatin (LIPITOR) 10 MG tablet Take 10 mg by mouth daily.    . budesonide-formoterol (SYMBICORT) 80-4.5 MCG/ACT inhaler Inhale 2 puffs into the lungs 2 (two) times daily.    . Calcium Carbonate-Vitamin D (CALCIUM + D PO) Take 1 tablet by mouth every morning.    . Cholecalciferol (VITAMIN D3) 2000 units TABS Take by mouth.    . dextromethorphan-guaiFENesin (MUCINEX DM) 30-600 MG 12hr tablet Take 1 tablet by mouth 2 (two) times daily.    . famotidine (PEPCID) 20 MG tablet Take 20 mg by mouth 2 (two) times daily.    Marland Kitchen lidocaine-prilocaine (EMLA) cream Apply a quarter size amount to port site 1 hour prior to chemo. Do not rub in. Cover with plastic wrap. 30 g 3  . lisinopril (PRINIVIL,ZESTRIL) 5 MG tablet Take 5 mg by mouth daily.    . metFORMIN (GLUCOPHAGE) 500 MG tablet Take 500 mg by mouth daily with breakfast.     . montelukast (SINGULAIR) 10 MG tablet     . omeprazole (PRILOSEC) 40 MG capsule Take 40 mg by mouth daily.    . ondansetron (ZOFRAN) 8 MG tablet Take 1 tablet (8 mg total) by mouth every 8 (eight) hours as needed for nausea or vomiting. 30 tablet 2  . PACLitaxel (TAXOL) 300 MG/50ML injection Inject into the vein. To start weekly Taxol x 12 weeks on January 12.    . prochlorperazine (COMPAZINE) 10 MG tablet Take 1 tablet (10 mg total) by mouth every 6 (six) hours as needed for nausea or vomiting. 30 tablet 2  . Trastuzumab (HERCEPTIN IV) Inject into the vein. To start on November 26, 2015.    Marland Kitchen azithromycin (ZITHROMAX) 250 MG tablet Take as directed (Patient not taking: Reported on 12/03/2015) 6 each 0   No current facility-administered medications on file prior to visit.    Past Surgical History  Procedure Laterality Date  . Abdominal hysterectomy  1983  . Nasal fracture surgery    . Breast lumpectomy with radioactive seed and sentinel lymph node biopsy Right 11/03/2015    Procedure: RIGHT BREAST LUMPECTOMY WITH RADIOACTIVE SEED AND SENTINEL LYMPH NODE MAPPING;   Surgeon: Erroll Luna, MD;  Location: [place];  Service: General;  Laterality: Right;  . Portacath placement Right 11/03/2015    Procedure: INSERTION PORT-A-CATH;  Surgeon: Erroll Luna, MD;  Location: [place];  Service: General;  Laterality: Right;    Denies any headaches, dizziness, double vision, fevers, chills, night sweats, nausea, vomiting, diarrhea, constipation, chest pain, heart palpitations, shortness of breath, blood in stool, black tarry stool, urinary pain, urinary burning, urinary frequency, hematuria.   PHYSICAL EXAMINATION  ECOG PERFORMANCE STATUS: 0 - Asymptomatic  There were no vitals filed for this visit.  GENERAL:alert, no distress, well nourished, well developed, comfortable, cooperative, smiling and in chemo-bed receiving fluids in while waiting for pre-treatment labs to be resulted and chemotherapy signed. SKIN: skin color, texture, turgor are normal, no rashes or significant lesions HEAD: Normocephalic, No masses, lesions, tenderness or abnormalities EYES: normal, EOMI, Conjunctiva are pink and non-injected EARS: External ears normal OROPHARYNX:lips,  buccal mucosa, and tongue normal and mucous membranes are moist  NECK: supple, no adenopathy, trachea midline LYMPH:  not examined BREAST:not examined LUNGS: clear to auscultation  HEART: regular rate & rhythm, no murmurs, no gallops, S1 normal and S2 normal ABDOMEN:abdomen soft, non-tender and normal bowel sounds BACK: Back symmetric, no curvature. EXTREMITIES:less then 2 second capillary refill, no joint deformities, effusion, or inflammation, no edema, no skin discoloration, no clubbing, no cyanosis  NEURO: alert & oriented x 3 with fluent speech, no focal motor/sensory deficits, gait normal    LABORATORY DATA: CBC    Component Value Date/Time   WBC 4.4 12/10/2015 0920   RBC 3.52* 12/10/2015 0920   HGB 10.8* 12/10/2015 0920   HCT 32.9* 12/10/2015 0920   PLT 248  12/10/2015 0920   MCV 93.5 12/10/2015 0920   MCH 30.7 12/10/2015 0920   MCHC 32.8 12/10/2015 0920   RDW 12.7 12/10/2015 0920   LYMPHSABS 1.5 12/10/2015 0920   MONOABS 0.3 12/10/2015 0920   EOSABS 0.2 12/10/2015 0920   BASOSABS 0.1 12/10/2015 0920      Chemistry      Component Value Date/Time   NA 139 12/10/2015 0920   K 3.8 12/10/2015 0920   CL 106 12/10/2015 0920   CO2 26 12/10/2015 0920   BUN 9 12/10/2015 0920   CREATININE 0.94 12/10/2015 0920      Component Value Date/Time   CALCIUM 8.8* 12/10/2015 0920   ALKPHOS 92 12/10/2015 0920   AST 19 12/10/2015 0920   ALT 15 12/10/2015 0920   BILITOT 1.0 12/10/2015 0920        PENDING LABS:   RADIOGRAPHIC STUDIES:  Nm Cardiac Muga Rest  11/21/2015  CLINICAL DATA:  Breast cancer. Patient may receive cardiotoxic chemotherapy. EXAM: NUCLEAR MEDICINE CARDIAC BLOOD POOL IMAGING (MUGA) TECHNIQUE: Cardiac multi-gated acquisition was performed at rest following intravenous injection of Tc-60m labeled red blood cells. RADIOPHARMACEUTICALS:  25.5 mCi Tc-36m MDP in-vitro labeled red blood cells IV COMPARISON:  None FINDINGS: The left ventricular ejection fraction is calculated equal 71.9%. Gated images show normal left ventricular wall motion. IMPRESSION: 1. Normal left ventricular ejection fraction equal to 71.9%. Electronically Signed   By: Signa Kell M.D.   On: 11/21/2015 13:00     PATHOLOGY:    ASSESSMENT AND PLAN:  Breast cancer of upper-inner quadrant of right female breast (H[facility]) Invasive ductal carcinoma of right breast, Stage IA (T1cN0M0), with multifocal disease in upper inner quadrant measuring 1.2 and 1.4 cm respectively, the largest lesion being grade 3 (smaller lesion in grade 2), ER/PR/HER2 POSITIVE.  S/P right lumpectomy by Dr. Luisa Hart on 11/03/2015.  Oncology history developed.  Staging in [facility] problem list completed.  Treatment plan reviewed.  Pre-treatment labs as ordered: CBC diff, CMET.  S/P MUGA prior to  initiation of treatment demonstrating a LVEF of 71.9%.  Will repeat in 8-12 weeks.  She has a consultation with Rad Onc, Dr. Michell Heinrich, in March 2017.  Return in 1 week for follow-up.     THERAPY PLAN:  Continue treatment as planned with 12 cycles of weekly Paclitaxel with Herceptin weekly.  Following completion of Paclitaxel, transitioning to Herceptin every 3 weeks would be reasonable to complete 52 weeks worth of treatment.  She will then undergo bone density exam with initiation of AI therapy.  She has a consultation appointment with Dr. Michell Heinrich in March for consideration of Radiation Therapy.  She lives closer to [place] than [place].  All questions were answered. The patient knows to call  the clinic with any problems, questions or concerns. We can certainly see the patient much sooner if necessary.  Patient and plan discussed with Dr. Ancil Linsey and she is in agreement with the aforementioned.   This note is electronically signed by: Robynn Pane, PA-C 12/10/2015 10:14 AM

## 2015-12-09 NOTE — Assessment & Plan Note (Addendum)
Invasive ductal carcinoma of right breast, Stage IA (T1cN0M0), with multifocal disease in upper inner quadrant measuring 1.2 and 1.4 cm respectively, the largest lesion being grade 3 (smaller lesion in grade 2), ER/PR/HER2 POSITIVE.  S/P right lumpectomy by Dr. Brantley Stage on 11/03/2015.  Oncology history developed.  Staging in [facility] problem list completed.  Treatment plan reviewed.  Pre-treatment labs as ordered: CBC diff, CMET.  S/P MUGA prior to initiation of treatment demonstrating a LVEF of 71.9%.  Will repeat in 8-12 weeks.  She has a consultation with Rad Onc, Dr. Pablo Ledger, in March 2017.  Return in 1 week for follow-up.

## 2015-12-10 ENCOUNTER — Encounter (HOSPITAL_COMMUNITY): Payer: Self-pay | Admitting: Oncology

## 2015-12-10 ENCOUNTER — Encounter (HOSPITAL_BASED_OUTPATIENT_CLINIC_OR_DEPARTMENT_OTHER): Payer: Medicare Other | Admitting: Oncology

## 2015-12-10 ENCOUNTER — Encounter (HOSPITAL_BASED_OUTPATIENT_CLINIC_OR_DEPARTMENT_OTHER): Payer: Medicare Other

## 2015-12-10 VITALS — Wt 115.6 lb

## 2015-12-10 VITALS — BP 152/67 | HR 85 | Temp 98.1°F | Resp 16

## 2015-12-10 DIAGNOSIS — Z5111 Encounter for antineoplastic chemotherapy: Secondary | ICD-10-CM | POA: Diagnosis not present

## 2015-12-10 DIAGNOSIS — Z5112 Encounter for antineoplastic immunotherapy: Secondary | ICD-10-CM | POA: Diagnosis present

## 2015-12-10 DIAGNOSIS — C50211 Malignant neoplasm of upper-inner quadrant of right female breast: Secondary | ICD-10-CM

## 2015-12-10 DIAGNOSIS — Z170 Estrogen receptor positive status [ER+]: Secondary | ICD-10-CM | POA: Diagnosis not present

## 2015-12-10 LAB — CBC WITH DIFFERENTIAL/PLATELET
BASOS ABS: 0.1 10*3/uL (ref 0.0–0.1)
Basophils Relative: 2 %
EOS ABS: 0.2 10*3/uL (ref 0.0–0.7)
Eosinophils Relative: 5 %
HCT: 32.9 % — ABNORMAL LOW (ref 36.0–46.0)
Hemoglobin: 10.8 g/dL — ABNORMAL LOW (ref 12.0–15.0)
Lymphocytes Relative: 33 %
Lymphs Abs: 1.5 10*3/uL (ref 0.7–4.0)
MCH: 30.7 pg (ref 26.0–34.0)
MCHC: 32.8 g/dL (ref 30.0–36.0)
MCV: 93.5 fL (ref 78.0–100.0)
Monocytes Absolute: 0.3 10*3/uL (ref 0.1–1.0)
Monocytes Relative: 6 %
Neutro Abs: 2.4 10*3/uL (ref 1.7–7.7)
Neutrophils Relative %: 54 %
PLATELETS: 248 10*3/uL (ref 150–400)
RBC: 3.52 MIL/uL — AB (ref 3.87–5.11)
RDW: 12.7 % (ref 11.5–15.5)
WBC: 4.4 10*3/uL (ref 4.0–10.5)

## 2015-12-10 LAB — COMPREHENSIVE METABOLIC PANEL
ALT: 15 U/L (ref 14–54)
AST: 19 U/L (ref 15–41)
Albumin: 3.5 g/dL (ref 3.5–5.0)
Alkaline Phosphatase: 92 U/L (ref 38–126)
Anion gap: 7 (ref 5–15)
BUN: 9 mg/dL (ref 6–20)
CHLORIDE: 106 mmol/L (ref 101–111)
CO2: 26 mmol/L (ref 22–32)
CREATININE: 0.94 mg/dL (ref 0.44–1.00)
Calcium: 8.8 mg/dL — ABNORMAL LOW (ref 8.9–10.3)
GFR calc non Af Amer: 59 mL/min — ABNORMAL LOW (ref >60)
Glucose, Bld: 179 mg/dL — ABNORMAL HIGH (ref 65–99)
POTASSIUM: 3.8 mmol/L (ref 3.5–5.1)
SODIUM: 139 mmol/L (ref 135–145)
Total Bilirubin: 1 mg/dL (ref 0.3–1.2)
Total Protein: 5.9 g/dL — ABNORMAL LOW (ref 6.5–8.1)

## 2015-12-10 MED ORDER — SODIUM CHLORIDE 0.9 % IV SOLN
Freq: Once | INTRAVENOUS | Status: AC
  Administered 2015-12-10: 10:00:00 via INTRAVENOUS

## 2015-12-10 MED ORDER — ACETAMINOPHEN 325 MG PO TABS
650.0000 mg | ORAL_TABLET | Freq: Once | ORAL | Status: AC
Start: 2015-12-10 — End: 2015-12-10
  Administered 2015-12-10: 650 mg via ORAL

## 2015-12-10 MED ORDER — ACETAMINOPHEN 325 MG PO TABS
ORAL_TABLET | ORAL | Status: AC
  Filled 2015-12-10: qty 2

## 2015-12-10 MED ORDER — FAMOTIDINE IN NACL 20-0.9 MG/50ML-% IV SOLN
INTRAVENOUS | Status: AC
  Filled 2015-12-10: qty 50

## 2015-12-10 MED ORDER — HEPARIN SOD (PORK) LOCK FLUSH 100 UNIT/ML IV SOLN
500.0000 [IU] | Freq: Once | INTRAVENOUS | Status: AC | PRN
  Administered 2015-12-10: 500 [IU]
  Filled 2015-12-10: qty 5

## 2015-12-10 MED ORDER — DIPHENHYDRAMINE HCL 50 MG/ML IJ SOLN
INTRAMUSCULAR | Status: AC
  Filled 2015-12-10: qty 1

## 2015-12-10 MED ORDER — DIPHENHYDRAMINE HCL 50 MG/ML IJ SOLN
50.0000 mg | Freq: Once | INTRAMUSCULAR | Status: AC
  Administered 2015-12-10: 50 mg via INTRAVENOUS

## 2015-12-10 MED ORDER — TRASTUZUMAB CHEMO INJECTION 440 MG
2.0000 mg/kg | Freq: Once | INTRAVENOUS | Status: AC
  Administered 2015-12-10: 105 mg via INTRAVENOUS
  Filled 2015-12-10: qty 5

## 2015-12-10 MED ORDER — SODIUM CHLORIDE 0.9 % IV SOLN: Freq: Once | INTRAVENOUS | Status: AC

## 2015-12-10 MED ORDER — SODIUM CHLORIDE 0.9 % IV SOLN
Freq: Once | INTRAVENOUS | Status: AC
  Administered 2015-12-10: 12:00:00 via INTRAVENOUS
  Filled 2015-12-10: qty 4

## 2015-12-10 MED ORDER — SODIUM CHLORIDE 0.9 % IJ SOLN: 10.0000 mL | INTRAMUSCULAR | Status: DC | PRN

## 2015-12-10 MED ORDER — DEXTROSE 5 % IV SOLN
80.0000 mg/m2 | Freq: Once | INTRAVENOUS | Status: AC
  Administered 2015-12-10: 120 mg via INTRAVENOUS
  Filled 2015-12-10: qty 20

## 2015-12-10 MED ORDER — FAMOTIDINE IN NACL 20-0.9 MG/50ML-% IV SOLN
20.0000 mg | Freq: Once | INTRAVENOUS | Status: AC
  Administered 2015-12-10: 20 mg via INTRAVENOUS

## 2015-12-10 NOTE — Patient Instructions (Signed)
[facility] Discharge Instructions for Patients Receiving Chemotherapy   Beginning January 23rd 2017 lab work for the [facility] will be done in the  Main lab at [facility] on 1st floor. If you have a lab appointment with the [place] please come in thru the  Main Entrance and check in at the main information desk   Today you received the following chemotherapy agents: Taxol and Herceptin.     If you develop nausea and vomiting, or diarrhea that is not controlled by your medication, call the clinic.  The clinic phone number is [phone]. Office hours are Monday-Friday 8:30am-5:00pm.  BELOW ARE SYMPTOMS THAT SHOULD BE REPORTED IMMEDIATELY:  *FEVER GREATER THAN 101.0 F  *CHILLS WITH OR WITHOUT FEVER  NAUSEA AND VOMITING THAT IS NOT CONTROLLED WITH YOUR NAUSEA MEDICATION  *UNUSUAL SHORTNESS OF BREATH  *UNUSUAL BRUISING OR BLEEDING  TENDERNESS IN MOUTH AND THROAT WITH OR WITHOUT PRESENCE OF ULCERS  *URINARY PROBLEMS  *BOWEL PROBLEMS  UNUSUAL RASH Items with * indicate a potential emergency and should be followed up as soon as possible. If you have an emergency after office hours please contact your primary care physician or go to the nearest emergency department.  Please call the clinic during office hours if you have any questions or concerns.   You may also contact the Patient Navigator at [phone] should you have any questions or need assistance in obtaining follow up care.

## 2015-12-10 NOTE — Patient Instructions (Signed)
..  [place] at [facility] Discharge Instructions  RECOMMENDATIONS MADE BY THE CONSULTANT AND ANY TEST RESULTS WILL BE SENT TO YOUR REFERRING PHYSICIAN.  Weekly Pacltaxel and herceptin Return next week as scheduled  Thank you for choosing [place] at [facility] to provide your oncology and hematology care.  To afford each patient quality time with our provider, please arrive at least 15 minutes before your scheduled appointment time.   Beginning January 23rd 2017 lab work for the [facility] will be done in the  Main lab at [facility] on 1st floor. If you have a lab appointment with the [place] please come in thru the  Main Entrance and check in at the main information desk  You need to re-schedule your appointment should you arrive 10 or more minutes late.  We strive to give you quality time with our providers, and arriving late affects you and other patients whose appointments are after yours.  Also, if you no show three or more times for appointments you may be dismissed from the clinic at the providers discretion.     Again, thank you for choosing [facility]  Our hope is that these requests will decrease the amount of time that you wait before being seen by our physicians.       _____________________________________________________________  Should you have questions after your visit to [facility], please contact our office at [phone] between the hours of 8:30 a.m. and 4:30 p.m.  Voicemails left after 4:30 p.m. will not be returned until the following business day.  For prescription refill requests, have your pharmacy contact our office.

## 2015-12-10 NOTE — Progress Notes (Signed)
Patient tolerated infusion well.  VSS.   

## 2015-12-17 ENCOUNTER — Encounter (HOSPITAL_BASED_OUTPATIENT_CLINIC_OR_DEPARTMENT_OTHER): Payer: Medicare Other | Admitting: Hematology & Oncology

## 2015-12-17 ENCOUNTER — Encounter (HOSPITAL_COMMUNITY): Payer: Medicare Other | Attending: Hematology & Oncology

## 2015-12-17 ENCOUNTER — Encounter (HOSPITAL_COMMUNITY): Payer: Self-pay | Admitting: Hematology & Oncology

## 2015-12-17 VITALS — BP 158/71 | HR 75 | Temp 97.9°F | Resp 16 | Wt 115.6 lb

## 2015-12-17 DIAGNOSIS — C50211 Malignant neoplasm of upper-inner quadrant of right female breast: Secondary | ICD-10-CM

## 2015-12-17 DIAGNOSIS — G620 Drug-induced polyneuropathy: Secondary | ICD-10-CM

## 2015-12-17 DIAGNOSIS — Z5112 Encounter for antineoplastic immunotherapy: Secondary | ICD-10-CM | POA: Diagnosis present

## 2015-12-17 DIAGNOSIS — Z170 Estrogen receptor positive status [ER+]: Secondary | ICD-10-CM | POA: Diagnosis not present

## 2015-12-17 DIAGNOSIS — T451X5A Adverse effect of antineoplastic and immunosuppressive drugs, initial encounter: Secondary | ICD-10-CM

## 2015-12-17 DIAGNOSIS — Z5111 Encounter for antineoplastic chemotherapy: Secondary | ICD-10-CM

## 2015-12-17 LAB — COMPREHENSIVE METABOLIC PANEL
ALK PHOS: 92 U/L (ref 38–126)
ALT: 12 U/L — AB (ref 14–54)
AST: 20 U/L (ref 15–41)
Albumin: 3.7 g/dL (ref 3.5–5.0)
Anion gap: 9 (ref 5–15)
BUN: 9 mg/dL (ref 6–20)
CALCIUM: 9.1 mg/dL (ref 8.9–10.3)
CO2: 24 mmol/L (ref 22–32)
CREATININE: 0.95 mg/dL (ref 0.44–1.00)
Chloride: 106 mmol/L (ref 101–111)
GFR, EST NON AFRICAN AMERICAN: 58 mL/min — AB (ref >60)
Glucose, Bld: 199 mg/dL — ABNORMAL HIGH (ref 65–99)
Potassium: 3.7 mmol/L (ref 3.5–5.1)
Sodium: 139 mmol/L (ref 135–145)
Total Bilirubin: 0.7 mg/dL (ref 0.3–1.2)
Total Protein: 6.1 g/dL — ABNORMAL LOW (ref 6.5–8.1)

## 2015-12-17 LAB — CBC WITH DIFFERENTIAL/PLATELET
Basophils Absolute: 0.1 10*3/uL (ref 0.0–0.1)
Basophils Relative: 2 %
EOS PCT: 3 %
Eosinophils Absolute: 0.2 10*3/uL (ref 0.0–0.7)
HCT: 34.3 % — ABNORMAL LOW (ref 36.0–46.0)
HEMOGLOBIN: 11.2 g/dL — AB (ref 12.0–15.0)
LYMPHS ABS: 1.8 10*3/uL (ref 0.7–4.0)
LYMPHS PCT: 34 %
MCH: 30.7 pg (ref 26.0–34.0)
MCHC: 32.7 g/dL (ref 30.0–36.0)
MCV: 94 fL (ref 78.0–100.0)
MONOS PCT: 6 %
Monocytes Absolute: 0.3 10*3/uL (ref 0.1–1.0)
Neutro Abs: 3 10*3/uL (ref 1.7–7.7)
Neutrophils Relative %: 55 %
PLATELETS: 308 10*3/uL (ref 150–400)
RBC: 3.65 MIL/uL — AB (ref 3.87–5.11)
RDW: 12.8 % (ref 11.5–15.5)
WBC: 5.5 10*3/uL (ref 4.0–10.5)

## 2015-12-17 MED ORDER — FAMOTIDINE IN NACL 20-0.9 MG/50ML-% IV SOLN
20.0000 mg | Freq: Once | INTRAVENOUS | Status: AC
  Administered 2015-12-17: 20 mg via INTRAVENOUS

## 2015-12-17 MED ORDER — HEPARIN SOD (PORK) LOCK FLUSH 100 UNIT/ML IV SOLN
500.0000 [IU] | Freq: Once | INTRAVENOUS | Status: AC | PRN
  Administered 2015-12-17: 500 [IU]
  Filled 2015-12-17: qty 5

## 2015-12-17 MED ORDER — SODIUM CHLORIDE 0.9 % IV SOLN
Freq: Once | INTRAVENOUS | Status: AC
  Administered 2015-12-17: 11:00:00 via INTRAVENOUS
  Filled 2015-12-17: qty 4

## 2015-12-17 MED ORDER — DIPHENHYDRAMINE HCL 50 MG/ML IJ SOLN
50.0000 mg | Freq: Once | INTRAMUSCULAR | Status: AC
  Administered 2015-12-17: 50 mg via INTRAVENOUS

## 2015-12-17 MED ORDER — DIPHENHYDRAMINE HCL 50 MG/ML IJ SOLN
INTRAMUSCULAR | Status: AC
  Filled 2015-12-17: qty 1

## 2015-12-17 MED ORDER — FAMOTIDINE IN NACL 20-0.9 MG/50ML-% IV SOLN
INTRAVENOUS | Status: AC
  Filled 2015-12-17: qty 50

## 2015-12-17 MED ORDER — ACETAMINOPHEN 325 MG PO TABS
650.0000 mg | ORAL_TABLET | Freq: Once | ORAL | Status: AC
  Administered 2015-12-17: 650 mg via ORAL

## 2015-12-17 MED ORDER — TRASTUZUMAB CHEMO INJECTION 440 MG
2.0000 mg/kg | Freq: Once | INTRAVENOUS | Status: AC
  Administered 2015-12-17: 105 mg via INTRAVENOUS
  Filled 2015-12-17: qty 5

## 2015-12-17 MED ORDER — ACETAMINOPHEN 325 MG PO TABS
ORAL_TABLET | ORAL | Status: AC
  Filled 2015-12-17: qty 2

## 2015-12-17 MED ORDER — SODIUM CHLORIDE 0.9 % IJ SOLN
10.0000 mL | INTRAMUSCULAR | Status: DC | PRN
  Administered 2015-12-17: 10 mL
  Filled 2015-12-17: qty 10

## 2015-12-17 MED ORDER — SODIUM CHLORIDE 0.9 % IV SOLN
Freq: Once | INTRAVENOUS | Status: AC
  Administered 2015-12-17: 10:00:00 via INTRAVENOUS

## 2015-12-17 MED ORDER — PACLITAXEL CHEMO INJECTION 300 MG/50ML
80.0000 mg/m2 | Freq: Once | INTRAVENOUS | Status: AC
  Administered 2015-12-17: 120 mg via INTRAVENOUS
  Filled 2015-12-17: qty 20

## 2015-12-17 NOTE — Patient Instructions (Addendum)
[place] at [facility] Discharge Instructions  RECOMMENDATIONS MADE BY THE CONSULTANT AND ANY TEST RESULTS WILL BE SENT TO YOUR REFERRING PHYSICIAN.     Exam and discussion by Dr Whitney Muse today Let us know if your legs get any worse with the tingling  Lab work looks good Chemotherapy today Return to see the doctor in 1 week  Weekly chemotherapy  Please call the clinic if you have any questions or concerns     Thank you for choosing [place] at [facility] to provide your oncology and hematology care.  To afford each patient quality time with our provider, please arrive at least 15 minutes before your scheduled appointment time.   Beginning January 23rd 2017 lab work for the [facility] will be done in the  Main lab at [facility] on 1st floor. If you have a lab appointment with the [place] please come in thru the  Main Entrance and check in at the main information desk  You need to re-schedule your appointment should you arrive 10 or more minutes late.  We strive to give you quality time with our providers, and arriving late affects you and other patients whose appointments are after yours.  Also, if you no show three or more times for appointments you may be dismissed from the clinic at the providers discretion.     Again, thank you for choosing [facility].  Our hope is that these requests will decrease the amount of time that you wait before being seen by our physicians.       _____________________________________________________________  Should you have questions after your visit to [facility], please contact our office at [phone] between the hours of 8:30 a.m. and 4:30 p.m.  Voicemails left after 4:30 p.m. will not be returned until the following business day.  For prescription refill requests, have your pharmacy contact our office.

## 2015-12-17 NOTE — Progress Notes (Signed)
Patient tolerated infusion well.  VSS.   

## 2015-12-17 NOTE — Patient Instructions (Signed)
[facility] Discharge Instructions for Patients Receiving Chemotherapy   Beginning January 23rd 2017 lab work for the [facility] will be done in the  Main lab at [facility] on 1st floor. If you have a lab appointment with the [place] please come in thru the  Main Entrance and check in at the main information desk   Today you received the following chemotherapy agents: Taxol and Herceptin.     If you develop nausea and vomiting, or diarrhea that is not controlled by your medication, call the clinic.  The clinic phone number is [phone]. Office hours are Monday-Friday 8:30am-5:00pm.  BELOW ARE SYMPTOMS THAT SHOULD BE REPORTED IMMEDIATELY:  *FEVER GREATER THAN 101.0 F  *CHILLS WITH OR WITHOUT FEVER  NAUSEA AND VOMITING THAT IS NOT CONTROLLED WITH YOUR NAUSEA MEDICATION  *UNUSUAL SHORTNESS OF BREATH  *UNUSUAL BRUISING OR BLEEDING  TENDERNESS IN MOUTH AND THROAT WITH OR WITHOUT PRESENCE OF ULCERS  *URINARY PROBLEMS  *BOWEL PROBLEMS  UNUSUAL RASH Items with * indicate a potential emergency and should be followed up as soon as possible. If you have an emergency after office hours please contact your primary care physician or go to the nearest emergency department.  Please call the clinic during office hours if you have any questions or concerns.   You may also contact the Patient Navigator at [phone] should you have any questions or need assistance in obtaining follow up care.

## 2015-12-17 NOTE — Progress Notes (Signed)
Lori Greene at [facility] Progress Note  Patient Care Team: Lajean Manes, MD as PCP - General  CHIEF COMPLAINTS:  Invasive ductal carcinoma triple positive  ER+, PR+, HER 2 neu +  RIGHT breast cancer, upper inner quadrant Screening mammogram on 09/04/2015 with possible R breast mass 2 densities noted in the R breast upper inner quadrant within a centimeter of each other (9 and 10 cm from the nipple) 11/03/2015 partial mastectomy, sentinel node biopsy and port placement with Dr. Brantley Stage Final pathology [id number] ER+PR+ Her 2 neu+ R breast carcinoma  HISTORY OF PRESENTING ILLNESS:  Lori Greene 75 y.o. female is here because of abnormal mammogram of the R breast and newly diagnosed R breast cancer.  Lori Greene is accompanied by her husband and receiving treatment in bed. I personally reviewed and discussed laboratory studies and weight with the patient and her husband. She notes that she eats well but maybe not as much as prior to chemotherapy.  She has not been sleeping well recently because she sleeps and dozes during the day. She is sleeping during the day because treatment makes her tired.   Reports one instance of vomiting, managed with an anti-nausea pill. She has not experienced these symptoms since.  She has been experiencing tingling in her toes, stating that it is worse at night. It happens every night and believes this may also affect her ability to sleep at night. She notes that she does not notice it during the day. It does not affect her ability to ambulate.  Denies any diarrhea or mouth sores.   She does not need refills at this time.    MEDICAL HISTORY:  Past Medical History  Diagnosis Date  . Hypertension   . Diabetes mellitus without complication ([place])   . Asthma   . GERD (gastroesophageal reflux disease)   . Cancer [facility]) 2016    right breast    SURGICAL HISTORY: Past Surgical History  Procedure Laterality Date  . Abdominal hysterectomy   1983  . Nasal fracture surgery    . Breast lumpectomy with radioactive seed and sentinel lymph node biopsy Right 11/03/2015    Procedure: RIGHT BREAST LUMPECTOMY WITH RADIOACTIVE SEED AND SENTINEL LYMPH NODE MAPPING;  Surgeon: Erroll Luna, MD;  Location: [place];  Service: General;  Laterality: Right;  . Portacath placement Right 11/03/2015    Procedure: INSERTION PORT-A-CATH;  Surgeon: Erroll Luna, MD;  Location: [place];  Service: General;  Laterality: Right;    SOCIAL HISTORY: Social History   Social History  . Marital Status: Married    Spouse Name: N/A  . Number of Children: N/A  . Years of Education: N/A   Occupational History  . retired    Social History Main Topics  . Smoking status: Never Smoker   . Smokeless tobacco: Never Used  . Alcohol Use: No  . Drug Use: No  . Sexual Activity: No   Other Topics Concern  . Not on file   Social History Narrative  Married 51 years. 0 children. Fostered children. 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 She likes to sew, knit, and quilt. She likes to walk but does not do it often. Non smoker ETOH, none She is from [facility] [place] [facility] and moved to [place] when she got married.  FAMILY HISTORY: Family History  Problem Relation Age of Onset  . Breast cancer Mother   . Leukemia Father    has no family status  information on file.  Father died at 57 yo of leukemia Mother died of old age; breast cancer diagnosis at 74 yo. She had a biopsy, no treatment. 1 sister, healthy. No breast cancer.  ALLERGIES:  is allergic to losartan and penicillins.  MEDICATIONS:  Current Outpatient Prescriptions  Medication Sig Dispense Refill  . albuterol (PROAIR HFA) 108 (90 BASE) MCG/ACT inhaler Inhale 2 puffs into the lungs every 6 (six) hours as needed for wheezing or shortness of breath.    Marland Kitchen atorvastatin (LIPITOR) 10 MG tablet Take 10 mg by mouth daily.    .  budesonide-formoterol (SYMBICORT) 80-4.5 MCG/ACT inhaler Inhale 2 puffs into the lungs 2 (two) times daily.    . Calcium Carbonate-Vitamin D (CALCIUM + D PO) Take 1 tablet by mouth every morning.    . Cholecalciferol (VITAMIN D3) 2000 units TABS Take by mouth.    . dextromethorphan-guaiFENesin (MUCINEX DM) 30-600 MG 12hr tablet Take 1 tablet by mouth 2 (two) times daily.    . famotidine (PEPCID) 20 MG tablet Take 20 mg by mouth 2 (two) times daily.    Marland Kitchen lidocaine-prilocaine (EMLA) cream Apply a quarter size amount to port site 1 hour prior to chemo. Do not rub in. Cover with plastic wrap. 30 g 3  . lisinopril (PRINIVIL,ZESTRIL) 5 MG tablet Take 5 mg by mouth daily.    . metFORMIN (GLUCOPHAGE) 500 MG tablet Take 500 mg by mouth daily with breakfast.     . montelukast (SINGULAIR) 10 MG tablet     . omeprazole (PRILOSEC) 40 MG capsule Take 40 mg by mouth daily.    . ondansetron (ZOFRAN) 8 MG tablet Take 1 tablet (8 mg total) by mouth every 8 (eight) hours as needed for nausea or vomiting. 30 tablet 2  . PACLitaxel (TAXOL) 300 MG/50ML injection Inject into the vein. To start weekly Taxol x 12 weeks on January 12.    . prochlorperazine (COMPAZINE) 10 MG tablet Take 1 tablet (10 mg total) by mouth every 6 (six) hours as needed for nausea or vomiting. 30 tablet 2  . Trastuzumab (HERCEPTIN IV) Inject into the vein. To start on November 26, 2015.     No current facility-administered medications for this visit.   Facility-Administered Medications Ordered in Other Visits  Medication Dose Route Frequency Provider Last Rate Last Dose  . 0.9 %  sodium chloride infusion   Intravenous Once Patrici Ranks, MD      . acetaminophen (TYLENOL) tablet 650 mg  650 mg Oral Once Patrici Ranks, MD      . diphenhydrAMINE (BENADRYL) injection 50 mg  50 mg Intravenous Once Patrici Ranks, MD      . famotidine (PEPCID) IVPB 20 mg premix  20 mg Intravenous Once Patrici Ranks, MD      . heparin lock flush 100  unit/mL  500 Units Intracatheter Once PRN Patrici Ranks, MD      . ondansetron (ZOFRAN) 8 mg, dexamethasone (DECADRON) 20 mg in sodium chloride 0.9 % 50 mL IVPB   Intravenous Once Patrici Ranks, MD      . PACLitaxel (TAXOL) 120 mg in dextrose 5 % 250 mL chemo infusion (</= 80mg /m2)  80 mg/m2 (Treatment Plan Actual) Intravenous Once Patrici Ranks, MD      . sodium chloride 0.9 % injection 10 mL  10 mL Intracatheter PRN Patrici Ranks, MD      . trastuzumab (HERCEPTIN) 105 mg in sodium chloride 0.9 % 250 mL chemo infusion  2 mg/kg (Treatment Plan Actual) Intravenous Once Patrici Ranks, MD        Review of Systems  Constitutional: Positive for weight loss. Negative for fever, chills and malaise/fatigue.       5 lbs weight loss since October  HENT: Negative.  Negative for congestion, hearing loss, nosebleeds, sore throat and tinnitus.   Eyes: Negative.  Negative for blurred vision, double vision, pain and discharge.  Respiratory: Negative.  Negative for cough, hemoptysis, sputum production, shortness of breath and wheezing.   Cardiovascular: Negative.  Negative for chest pain, palpitations, claudication, leg swelling and PND.  Gastrointestinal: Positive for vomiting. Negative for heartburn, nausea, abdominal pain, diarrhea, constipation, blood in stool and melena.       One episode of vomiting.  Genitourinary: Negative.  Negative for dysuria, urgency, frequency and hematuria.  Musculoskeletal: Negative.  Negative for myalgias, joint pain and falls.  Skin: Negative.  Negative for itching and rash.  Neurological: Positive for tingling. Negative for dizziness, tremors, sensory change, speech change, focal weakness, seizures, loss of consciousness, weakness and headaches.       Tingling in toes, worse at night.  Endo/Heme/Allergies: Negative.  Does not bruise/bleed easily.  Psychiatric/Behavioral: Negative for depression, suicidal ideas, memory loss and substance abuse. The patient  has insomnia. The patient is not nervous/anxious.        Unable to sleep at night due to naps during the day.  All other systems reviewed and are negative.  14 point ROS was done and is otherwise as detailed above or in HPI  PHYSICAL EXAMINATION: ECOG PERFORMANCE STATUS: 0 - Asymptomatic   Vitals with BMI 12/17/2015  Height   Weight 115 lbs 10 oz  BMI   Systolic [id number]  Diastolic 64  Pulse 78  Respirations 16    Physical Exam  Constitutional: She is oriented to person, place, and time and well-developed, well-nourished, and in no distress.  Wearing head scarf  HENT:  Head: Normocephalic and atraumatic.  Nose: Nose normal.  Mouth/Throat: Oropharynx is clear and moist. No oropharyngeal exudate.  Eyes: Conjunctivae and EOM are normal. Pupils are equal, round, and reactive to light. Right eye exhibits no discharge. Left eye exhibits no discharge. No scleral icterus.  Neck: Normal range of motion. Neck supple. No tracheal deviation present. No thyromegaly present.  Cardiovascular: Normal rate, regular rhythm and normal heart sounds.  Exam reveals no gallop and no friction rub.   No murmur heard. Pulmonary/Chest: Effort normal and breath sounds normal. She has no wheezes. She has no rales.  Abdominal: Soft. Bowel sounds are normal. She exhibits no distension and no mass. There is no tenderness. There is no rebound and no guarding.  Musculoskeletal: Normal range of motion. She exhibits no edema.  Lymphadenopathy:    She has no cervical adenopathy.  Neurological: She is alert and oriented to person, place, and time. She has normal reflexes. No cranial nerve deficit. Gait normal. Coordination normal.  Skin: Skin is warm and dry. No rash noted.  Psychiatric: Mood, memory, affect and judgment normal.  Nursing note and vitals reviewed.   LABORATORY DATA:  I have reviewed the data as listed Lab Results  Component Value Date   WBC 5.5 12/17/2015   HGB 11.2* 12/17/2015   HCT 34.3*  12/17/2015   MCV 94.0 12/17/2015   PLT 308 12/17/2015   CMP     Component Value Date/Time   NA 139 12/17/2015 0951   K 3.7 12/17/2015 0951   CL 106 12/17/2015 0951  CO2 24 12/17/2015 0951   GLUCOSE 199* 12/17/2015 0951   BUN 9 12/17/2015 0951   CREATININE 0.95 12/17/2015 0951   CALCIUM 9.1 12/17/2015 0951   PROT 6.1* 12/17/2015 0951   ALBUMIN 3.7 12/17/2015 0951   AST 20 12/17/2015 0951   ALT 12* 12/17/2015 0951   ALKPHOS 92 12/17/2015 0951   BILITOT 0.7 12/17/2015 0951   GFRNONAA 58* 12/17/2015 0951   GFRAA >60 12/17/2015 0951    RADIOGRAPHIC STUDIES: I have personally reviewed the radiological images as listed and agreed with the findings in the report.  CLINICAL DATA: Post biopsy mammogram of the right breast for clip placement.  EXAM: DIAGNOSTIC RIGHT MAMMOGRAM POST ULTRASOUND BIOPSY  COMPARISON: Previous exam(s).  FINDINGS: Mammographic images were obtained following ultrasound guided biopsy of 2 small masses in the upper inner quadrant of the right breast. A coil shaped biopsy marking clip and a ribbon shaped biopsy marking clip are appropriately positioned at the intended site of biopsy in the right breast at approximately 1 o'clock, at 9 and 10 cm from the nipple respectively.  IMPRESSION: Appropriate positioning of the 2 biopsy marking clips in the upper-inner quadrant of the right breast at 1 o'clock.  Final Assessment: Post Procedure Mammograms for Marker Placement   Electronically Signed  By: Ammie Ferrier M.D.  On: 09/21/2015 16:16  PATHOLOGY:   ASSESSMENT & PLAN:  Invasive ductal carcinoma triple positive  ER+ (100%), PR+ (10%), HER 2 neu +  RIGHT breast cancer, upper inner quadrant Screening mammogram on 09/04/2015 with possible R breast mass 2 densities noted in the R breast upper inner quadrant within a centimeter of each other (9 and 10 cm from the nipple) 11/03/2015 partial mastectomy, sentinel node biopsy and port  placement with Dr. Brantley Stage Final pathology pT1cN0M0 ER+PR+ Her 2 neu+ R breast carcinoma  She will proceed with her next cycle of chemotherapy today.  I personally reviewed and discussed laboratory studies and weight with the patient and her husband. The patient has lost 5 lbs since October. We will continue to monitor her weight trends. I have encouraged her to use her nausea medications and eat small frequent meals. She is on omeprazole.  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 She will contact us if this worsens further. We will re-evaluate at follow-up. This does not bother her during the day nor interfere with her ADL's.  She does not need refills at this time.   She will return in 1 week for follow up with labs.  All questions were answered. The patient knows to call the clinic with any problems, questions or concerns.  This note was electronically signed.   This document serves as a record of services personally performed by Ancil Linsey, MD. It was created on her behalf by Arlyce Harman, a trained medical scribe. The creation of this record is based on the scribe's personal observations and the provider's statements to them. This document has been checked and approved by the attending provider.  I have reviewed the above documentation for accuracy and completeness, and I agree with the above.   Molli Hazard, MD  12/17/2015 10:40 AM

## 2015-12-24 ENCOUNTER — Encounter (HOSPITAL_BASED_OUTPATIENT_CLINIC_OR_DEPARTMENT_OTHER): Payer: Medicare Other | Admitting: Oncology

## 2015-12-24 ENCOUNTER — Encounter (HOSPITAL_BASED_OUTPATIENT_CLINIC_OR_DEPARTMENT_OTHER): Payer: Medicare Other

## 2015-12-24 ENCOUNTER — Inpatient Hospital Stay (HOSPITAL_COMMUNITY): Payer: Medicare Other

## 2015-12-24 VITALS — BP 169/81 | HR 76 | Temp 98.0°F | Resp 16 | Wt 116.6 lb

## 2015-12-24 DIAGNOSIS — Z5112 Encounter for antineoplastic immunotherapy: Secondary | ICD-10-CM

## 2015-12-24 DIAGNOSIS — C50211 Malignant neoplasm of upper-inner quadrant of right female breast: Secondary | ICD-10-CM | POA: Diagnosis not present

## 2015-12-24 DIAGNOSIS — Z5111 Encounter for antineoplastic chemotherapy: Secondary | ICD-10-CM

## 2015-12-24 DIAGNOSIS — Z170 Estrogen receptor positive status [ER+]: Secondary | ICD-10-CM | POA: Diagnosis not present

## 2015-12-24 LAB — CBC WITH DIFFERENTIAL/PLATELET
Basophils Absolute: 0.1 10*3/uL (ref 0.0–0.1)
Basophils Relative: 2 %
EOS ABS: 0.2 10*3/uL (ref 0.0–0.7)
Eosinophils Relative: 3 %
HEMATOCRIT: 32.5 % — AB (ref 36.0–46.0)
HEMOGLOBIN: 10.6 g/dL — AB (ref 12.0–15.0)
LYMPHS ABS: 2 10*3/uL (ref 0.7–4.0)
LYMPHS PCT: 34 %
MCH: 30.6 pg (ref 26.0–34.0)
MCHC: 32.6 g/dL (ref 30.0–36.0)
MCV: 93.9 fL (ref 78.0–100.0)
MONOS PCT: 5 %
Monocytes Absolute: 0.3 10*3/uL (ref 0.1–1.0)
NEUTROS ABS: 3.2 10*3/uL (ref 1.7–7.7)
NEUTROS PCT: 56 %
Platelets: 312 10*3/uL (ref 150–400)
RBC: 3.46 MIL/uL — ABNORMAL LOW (ref 3.87–5.11)
RDW: 12.9 % (ref 11.5–15.5)
WBC: 5.8 10*3/uL (ref 4.0–10.5)

## 2015-12-24 LAB — COMPREHENSIVE METABOLIC PANEL
ALK PHOS: 84 U/L (ref 38–126)
ALT: 13 U/L — ABNORMAL LOW (ref 14–54)
ANION GAP: 8 (ref 5–15)
AST: 21 U/L (ref 15–41)
Albumin: 3.5 g/dL (ref 3.5–5.0)
BILIRUBIN TOTAL: 0.4 mg/dL (ref 0.3–1.2)
BUN: 12 mg/dL (ref 6–20)
CALCIUM: 8.7 mg/dL — AB (ref 8.9–10.3)
CO2: 25 mmol/L (ref 22–32)
CREATININE: 0.91 mg/dL (ref 0.44–1.00)
Chloride: 106 mmol/L (ref 101–111)
Glucose, Bld: 169 mg/dL — ABNORMAL HIGH (ref 65–99)
Potassium: 3.8 mmol/L (ref 3.5–5.1)
Sodium: 139 mmol/L (ref 135–145)
TOTAL PROTEIN: 5.9 g/dL — AB (ref 6.5–8.1)

## 2015-12-24 MED ORDER — ACETAMINOPHEN 325 MG PO TABS
650.0000 mg | ORAL_TABLET | Freq: Once | ORAL | Status: AC
  Administered 2015-12-24: 650 mg via ORAL

## 2015-12-24 MED ORDER — DIPHENHYDRAMINE HCL 50 MG/ML IJ SOLN
INTRAMUSCULAR | Status: AC
  Filled 2015-12-24: qty 1

## 2015-12-24 MED ORDER — SODIUM CHLORIDE 0.9 % IV SOLN
Freq: Once | INTRAVENOUS | Status: AC
  Administered 2015-12-24: 10:00:00 via INTRAVENOUS

## 2015-12-24 MED ORDER — TRASTUZUMAB CHEMO INJECTION 440 MG
2.0000 mg/kg | Freq: Once | INTRAVENOUS | Status: AC
  Administered 2015-12-24: 105 mg via INTRAVENOUS
  Filled 2015-12-24: qty 5

## 2015-12-24 MED ORDER — ACETAMINOPHEN 325 MG PO TABS
ORAL_TABLET | ORAL | Status: AC
  Filled 2015-12-24: qty 2

## 2015-12-24 MED ORDER — FAMOTIDINE IN NACL 20-0.9 MG/50ML-% IV SOLN
20.0000 mg | Freq: Once | INTRAVENOUS | Status: AC
  Administered 2015-12-24: 20 mg via INTRAVENOUS

## 2015-12-24 MED ORDER — FAMOTIDINE IN NACL 20-0.9 MG/50ML-% IV SOLN
INTRAVENOUS | Status: AC
  Filled 2015-12-24: qty 50

## 2015-12-24 MED ORDER — HEPARIN SOD (PORK) LOCK FLUSH 100 UNIT/ML IV SOLN
500.0000 [IU] | Freq: Once | INTRAVENOUS | Status: AC | PRN
  Administered 2015-12-24: 500 [IU]
  Filled 2015-12-24: qty 5

## 2015-12-24 MED ORDER — SODIUM CHLORIDE 0.9 % IV SOLN
Freq: Once | INTRAVENOUS | Status: AC
  Administered 2015-12-24: 10:00:00 via INTRAVENOUS
  Filled 2015-12-24: qty 4

## 2015-12-24 MED ORDER — SODIUM CHLORIDE 0.9 % IJ SOLN
10.0000 mL | INTRAMUSCULAR | Status: DC | PRN
  Administered 2015-12-24: 10 mL
  Filled 2015-12-24: qty 10

## 2015-12-24 MED ORDER — PACLITAXEL CHEMO INJECTION 300 MG/50ML
80.0000 mg/m2 | Freq: Once | INTRAVENOUS | Status: AC
  Administered 2015-12-24: 120 mg via INTRAVENOUS
  Filled 2015-12-24: qty 20

## 2015-12-24 MED ORDER — DIPHENHYDRAMINE HCL 50 MG/ML IJ SOLN
50.0000 mg | Freq: Once | INTRAMUSCULAR | Status: AC
  Administered 2015-12-24: 50 mg via INTRAVENOUS

## 2015-12-24 NOTE — Progress Notes (Signed)
Mathews Argyle, MD [address] [place] [address]  Breast cancer of upper-inner quadrant of right female breast [facility])  CURRENT THERAPY: Weekly Paclitaxel and Herceptin beginning on 11/26/2015.  INTERVAL HISTORY: [place] 75 y.o. female returns for followup of invasive ductal carcinoma of right breast, Stage IA (T1cN0M0), with multifocal disease in upper inner quadrant measuring 1.2 and 1.4 cm respectively, the largest lesion being grade 3 (smaller lesion in grade 2), ER/PR/HER2 POSITIVE.    Breast cancer of upper-inner quadrant of right female breast ([place])   09/01/2015 Mammogram BI-RADS CATEGORY 0: Incomplete. Need additional imaging evaluation and/or prior mammograms for comparison.   09/15/2015 Imaging CT chest- Right lower lobe 4 mm solid pulmonary nodule. If the patient is at high risk for bronchogenic carcinoma, follow-up chest CT at 1 year is recommended.   09/16/2015 Breast US US showing a small hypoechoic irregular mass with hyperechoic rim in the 1 o'clock location of R breast 10 cm from the nipple. Mass measures 0.7 x 0.5 x 0.6 cm. 2nd mass is identified in the 1 o'clock location 9 cm from the nipple 0.7 x 0.7 x 0.6 cm   09/16/2015 Mammogram Two suspicious masses in the 1 o'clock location of the right breast warranting tissue diagnosis.  No evidence for adenopathy.   09/21/2015 Pathology Results Estrogen Receptor: 100%, POSITIVE, STRONG STAINING INTENSITY Progesterone Receptor: 10%, POSITIVE, MODERATE STAINING INTENSITY Proliferation Marker Ki67: 10%.  HER2 - **POSITIVE**   09/21/2015 Mammogram Appropriate positioning of the 2 biopsy marking clips in the upper-inner quadrant of the right breast at 1 o'clock.   09/21/2015 Initial Biopsy Breast, right, needle core biopsy, 1:00 o'clock, 9 CMFN - INVASIVE DUCTAL CARCINOMA. - DUCTAL CARCINOMA IN SITU. - SEE COMMENT. 2. Breast, right, needle core biopsy, 1:00 o'clock, 10 CMFN - INVASIVE DUCTAL CARCINOMA. -  DUCTAL CARCINOMA IN SITU WITH    10/26/2015 Procedure Right chest porta cath placed with no adverse features.- Dr. Brantley Stage.   11/03/2015 Pathology Results MULTIFOCAL INVASIVE DUCTAL CARCINOMA.  INVASIVE TUMOR IS 1.0 MM FROM NEAREST MARGIN (INFERIOR).  HIGH GRADE DUCTAL CARCINOMA IN SITU WITH NECROSIS.  IN SITU CARCINOMA IS 0.1 MM FROM THE NEAREST MARGIN (INFERIOR MARGIN).   11/03/2015 Procedure Right lumpectomy by Dr. Brantley Stage.   11/17/2015 Initial Diagnosis Breast cancer of upper-inner quadrant of right female breast ([place])   11/21/2015 Imaging Normal left ventricular ejection fraction equal to 71.9%.   11/26/2015 -  Chemotherapy Paclitaxel weekly x 12    11/26/2015 -  Antibody Plan Herceptin weekly with Paclitaxel.    I personally reviewed and went over laboratory results with the patient.  The results are noted within this dictation.  Labs meet treatment parameters today.  She is noted to be sleepy in her chemo-bed.  She did receive Benadryl.  She notes minimal nausea at home that will controlled with home anti-emetics.  She denies any emesis.  She reports a good appetite.  Her weight is up 1 lb since last week.  She denies any PN symptoms.    Past Medical History  Diagnosis Date  . Hypertension   . Diabetes mellitus without complication ([place])   . Asthma   . GERD (gastroesophageal reflux disease)   . Cancer [facility]) 2016    right breast    has Chest crackles; Cough; Hiatal hernia; HTN (hypertension); Lung nodule; and Breast cancer of upper-inner quadrant of right female breast ([place]) on her problem list.     is allergic  to losartan and penicillins.  Current Outpatient Prescriptions on File Prior to Visit  Medication Sig Dispense Refill  . albuterol (PROAIR HFA) 108 (90 BASE) MCG/ACT inhaler Inhale 2 puffs into the lungs every 6 (six) hours as needed for wheezing or shortness of breath.    Marland Kitchen atorvastatin (LIPITOR) 10 MG tablet Take 10 mg by mouth daily.    . budesonide-formoterol  (SYMBICORT) 80-4.5 MCG/ACT inhaler Inhale 2 puffs into the lungs 2 (two) times daily.    . Calcium Carbonate-Vitamin D (CALCIUM + D PO) Take 1 tablet by mouth every morning.    . Cholecalciferol (VITAMIN D3) 2000 units TABS Take by mouth.    . dextromethorphan-guaiFENesin (MUCINEX DM) 30-600 MG 12hr tablet Take 1 tablet by mouth 2 (two) times daily. Reported on 12/24/2015    . famotidine (PEPCID) 20 MG tablet Take 20 mg by mouth 2 (two) times daily.    Marland Kitchen lidocaine-prilocaine (EMLA) cream Apply a quarter size amount to port site 1 hour prior to chemo. Do not rub in. Cover with plastic wrap. 30 g 3  . lisinopril (PRINIVIL,ZESTRIL) 5 MG tablet Take 5 mg by mouth daily.    . metFORMIN (GLUCOPHAGE) 500 MG tablet Take 500 mg by mouth daily with breakfast.     . montelukast (SINGULAIR) 10 MG tablet     . omeprazole (PRILOSEC) 40 MG capsule Take 40 mg by mouth daily.    . ondansetron (ZOFRAN) 8 MG tablet Take 1 tablet (8 mg total) by mouth every 8 (eight) hours as needed for nausea or vomiting. 30 tablet 2  . PACLitaxel (TAXOL) 300 MG/50ML injection Inject into the vein. To start weekly Taxol x 12 weeks on January 12.    . prochlorperazine (COMPAZINE) 10 MG tablet Take 1 tablet (10 mg total) by mouth every 6 (six) hours as needed for nausea or vomiting. 30 tablet 2  . Trastuzumab (HERCEPTIN IV) Inject into the vein. To start on November 26, 2015.     Current Facility-Administered Medications on File Prior to Visit  Medication Dose Route Frequency Provider Last Rate Last Dose  . heparin lock flush 100 unit/mL  500 Units Intracatheter Once PRN Patrici Ranks, MD      . PACLitaxel (TAXOL) 120 mg in dextrose 5 % 250 mL chemo infusion (</= '80mg'$ /m2)  80 mg/m2 (Treatment Plan Actual) Intravenous Once Patrici Ranks, MD      . sodium chloride 0.9 % injection 10 mL  10 mL Intracatheter PRN Patrici Ranks, MD   10 mL at 12/24/15 0944  . trastuzumab (HERCEPTIN) 105 mg in sodium chloride 0.9 % 250 mL chemo  infusion  2 mg/kg (Treatment Plan Actual) Intravenous Once Patrici Ranks, MD 510 mL/hr at 12/24/15 1043 105 mg at 12/24/15 1043    Past Surgical History  Procedure Laterality Date  . Abdominal hysterectomy  1983  . Nasal fracture surgery    . Breast lumpectomy with radioactive seed and sentinel lymph node biopsy Right 11/03/2015    Procedure: RIGHT BREAST LUMPECTOMY WITH RADIOACTIVE SEED AND SENTINEL LYMPH NODE MAPPING;  Surgeon: Erroll Luna, MD;  Location: [facility];  Service: General;  Laterality: Right;  . Portacath placement Right 11/03/2015    Procedure: INSERTION PORT-A-CATH;  Surgeon: Erroll Luna, MD;  Location: [place];  Service: General;  Laterality: Right;    Denies any headaches, dizziness, double vision, fevers, chills, night sweats, nausea, vomiting, diarrhea, constipation, chest pain, heart palpitations, shortness of breath, blood in stool, black  tarry stool, urinary pain, urinary burning, urinary frequency, hematuria.   PHYSICAL EXAMINATION  ECOG PERFORMANCE STATUS: 0 - Asymptomatic  There were no vitals filed for this visit.  GENERAL:alert, no distress, well nourished, well developed, comfortable, cooperative, smiling and in chemo-bed and tired secondary to Benadryl pre-med with husband at the bedside. SKIN: skin color, texture, turgor are normal, no rashes or significant lesions HEAD: Normocephalic, No masses, lesions, tenderness or abnormalities EYES: normal, EOMI, Conjunctiva are pink and non-injected EARS: External ears normal OROPHARYNX:lips, buccal mucosa, and tongue normal and mucous membranes are moist  NECK: supple, no adenopathy, trachea midline LYMPH:  not examined BREAST:not examined LUNGS: clear to auscultation  HEART: regular rate & rhythm, no murmurs, no gallops, S1 normal and S2 normal ABDOMEN:abdomen soft, non-tender and normal bowel sounds BACK: Back symmetric, no curvature. EXTREMITIES:less then 2 second  capillary refill, no joint deformities, effusion, or inflammation, no edema, no skin discoloration, no clubbing, no cyanosis  NEURO: alert & oriented x 3 with fluent speech, no focal motor/sensory deficits, gait normal    LABORATORY DATA: CBC    Component Value Date/Time   WBC 5.8 12/24/2015 0846   RBC 3.46* 12/24/2015 0846   HGB 10.6* 12/24/2015 0846   HCT 32.5* 12/24/2015 0846   PLT 312 12/24/2015 0846   MCV 93.9 12/24/2015 0846   MCH 30.6 12/24/2015 0846   MCHC 32.6 12/24/2015 0846   RDW 12.9 12/24/2015 0846   LYMPHSABS 2.0 12/24/2015 0846   MONOABS 0.3 12/24/2015 0846   EOSABS 0.2 12/24/2015 0846   BASOSABS 0.1 12/24/2015 0846      Chemistry      Component Value Date/Time   NA 139 12/24/2015 0846   K 3.8 12/24/2015 0846   CL 106 12/24/2015 0846   CO2 25 12/24/2015 0846   BUN 12 12/24/2015 0846   CREATININE 0.91 12/24/2015 0846      Component Value Date/Time   CALCIUM 8.7* 12/24/2015 0846   ALKPHOS 84 12/24/2015 0846   AST 21 12/24/2015 0846   ALT 13* 12/24/2015 0846   BILITOT 0.4 12/24/2015 0846        PENDING LABS:   RADIOGRAPHIC STUDIES:  No results found.   PATHOLOGY:    ASSESSMENT AND PLAN:  Breast cancer of upper-inner quadrant of right female breast ([place]) Invasive ductal carcinoma of right breast, Stage IA (T1cN0M0), with multifocal disease in upper inner quadrant measuring 1.2 and 1.4 cm respectively, the largest lesion being grade 3 (smaller lesion in grade 2), ER/PR/HER2 POSITIVE.  S/P right lumpectomy by Dr. Brantley Stage on 11/03/2015.  Oncology history is up-to-date.  Pre-treatment labs as ordered: CBC diff, CMET.  She reports that her cough has completely resolved.  She is advised that she may stop her Mucinex DM as a result.    She notes that her home anti-emetics are very effective and she denies any emesis.  Her weight is up 1 lb compared to last week.  She reports an upcoming appointment with her primary care provider.  "Do I have  to go?"  I have recommended she call her primary care provider and ask.  We perform regular blood work, but this does not include lipids and other primary care blood tests.  She requests a refill on her glucometer strips.  I will write an Rx for this.  S/P MUGA prior to initiation of treatment demonstrating a LVEF of 71.9%.  Will repeat in 8-12 weeks.  She has a consultation with Rad Onc, Dr. Pablo Ledger, in March 2017.  Return in 1 week for follow-up.    THERAPY PLAN:  Continue treatment as planned with 12 cycles of weekly Paclitaxel with Herceptin weekly.  Following completion of Paclitaxel, transitioning to Herceptin every 3 weeks would be reasonable to complete 52 weeks worth of treatment.  She will then undergo bone density exam with initiation of AI therapy.  She has a consultation appointment with Dr. Pablo Ledger in March for consideration of Radiation Therapy.  She lives closer to [place] than [place].  All questions were answered. The patient knows to call the clinic with any problems, questions or concerns. We can certainly see the patient much sooner if necessary.  Patient and plan discussed with Dr. Ancil Linsey and she is in agreement with the aforementioned.   This note is electronically signed by: Doy Mince 12/24/2015 10:56 AM

## 2015-12-24 NOTE — Patient Instructions (Signed)
[place] at [facility] Discharge Instructions  RECOMMENDATIONS MADE BY THE CONSULTANT AND ANY TEST RESULTS WILL BE SENT TO YOUR REFERRING PHYSICIAN.  Got treated today. Stop mucinex We gave you a prescription for your glucometer strips Return as scheduled next week with Dr. Whitney Muse and chemo.  Thank you for choosing [place] at [facility] to provide your oncology and hematology care.  To afford each patient quality time with our provider, please arrive at least 15 minutes before your scheduled appointment time.   Beginning January 23rd 2017 lab work for the [facility] will be done in the  Main lab at [facility] on 1st floor. If you have a lab appointment with the [facility] please come in thru the  Main Entrance and check in at the main information desk  You need to re-schedule your appointment should you arrive 10 or more minutes late.  We strive to give you quality time with our providers, and arriving late affects you and other patients whose appointments are after yours.  Also, if you no show three or more times for appointments you may be dismissed from the clinic at the providers discretion.     Again, thank you for choosing [facility]  Our hope is that these requests will decrease the amount of time that you wait before being seen by our physicians.       _____________________________________________________________  Should you have questions after your visit to [facility], please contact our office at [phone] between the hours of 8:30 a.m. and 4:30 p.m.  Voicemails left after 4:30 p.m. will not be returned until the following business day.  For prescription refill requests, have your pharmacy contact our office.

## 2015-12-24 NOTE — Assessment & Plan Note (Addendum)
Invasive ductal carcinoma of right breast, Stage IA (T1cN0M0), with multifocal disease in upper inner quadrant measuring 1.2 and 1.4 cm respectively, the largest lesion being grade 3 (smaller lesion in grade 2), ER/PR/HER2 POSITIVE.  S/P right lumpectomy by Dr. Brantley Stage on 11/03/2015.  Oncology history is up-to-date.  Pre-treatment labs as ordered: CBC diff, CMET.  She reports that her cough has completely resolved.  She is advised that she may stop her Mucinex DM as a result.    She notes that her home anti-emetics are very effective and she denies any emesis.  Her weight is up 1 lb compared to last week.  She reports an upcoming appointment with her primary care provider.  "Do I have to go?"  I have recommended she call her primary care provider and ask.  We perform regular blood work, but this does not include lipids and other primary care blood tests.  She requests a refill on her glucometer strips.  I will write an Rx for this.  S/P MUGA prior to initiation of treatment demonstrating a LVEF of 71.9%.  Will repeat in 8-12 weeks.  She has a consultation with Rad Onc, Dr. Pablo Ledger, in March 2017.  Return in 1 week for follow-up.

## 2015-12-24 NOTE — Progress Notes (Signed)
Tolerated chemo well. Ambulatory on discharge home with husband. 

## 2015-12-24 NOTE — Patient Instructions (Signed)
[facility] Discharge Instructions for Patients Receiving Chemotherapy  Today you received the following chemotherapy agents Taxol and Herceptin.  To help prevent nausea and vomiting after your treatment, we encourage you to take your nausea medication as instructed. If you develop nausea and vomiting that is not controlled by your nausea medication, call the clinic. If it is after clinic hours your family physician or the after hours number for the clinic or go to the Emergency Department. BELOW ARE SYMPTOMS THAT SHOULD BE REPORTED IMMEDIATELY:  *FEVER GREATER THAN 101.0 F  *CHILLS WITH OR WITHOUT FEVER  NAUSEA AND VOMITING THAT IS NOT CONTROLLED WITH YOUR NAUSEA MEDICATION  *UNUSUAL SHORTNESS OF BREATH  *UNUSUAL BRUISING OR BLEEDING  TENDERNESS IN MOUTH AND THROAT WITH OR WITHOUT PRESENCE OF ULCERS  *URINARY PROBLEMS  *BOWEL PROBLEMS  UNUSUAL RASH Items with * indicate a potential emergency and should be followed up as soon as possible.  Return as scheduled. I have been informed and understand all the instructions given to me. I know to contact the clinic, my physician, or go to the Emergency Department if any problems should occur. I do not have any questions at this time, but understand that I may call the clinic during office hours or the Patient Navigator at [phone] should I have any questions or need assistance in obtaining follow up care.    __________________________________________  _____________  __________ Signature of Patient or Authorized Representative            Date                   Time    __________________________________________ Nurse's Signature  

## 2015-12-31 ENCOUNTER — Encounter (HOSPITAL_BASED_OUTPATIENT_CLINIC_OR_DEPARTMENT_OTHER): Payer: Medicare Other

## 2015-12-31 ENCOUNTER — Encounter (HOSPITAL_BASED_OUTPATIENT_CLINIC_OR_DEPARTMENT_OTHER): Payer: Medicare Other | Admitting: Hematology & Oncology

## 2015-12-31 ENCOUNTER — Encounter (HOSPITAL_COMMUNITY): Payer: Self-pay | Admitting: Hematology & Oncology

## 2015-12-31 VITALS — BP 158/63 | HR 77 | Temp 97.9°F | Resp 16

## 2015-12-31 VITALS — BP 152/71 | HR 78 | Temp 97.5°F | Resp 16 | Wt 117.0 lb

## 2015-12-31 DIAGNOSIS — Z170 Estrogen receptor positive status [ER+]: Secondary | ICD-10-CM

## 2015-12-31 DIAGNOSIS — C50211 Malignant neoplasm of upper-inner quadrant of right female breast: Secondary | ICD-10-CM | POA: Diagnosis not present

## 2015-12-31 DIAGNOSIS — Z5111 Encounter for antineoplastic chemotherapy: Secondary | ICD-10-CM

## 2015-12-31 LAB — COMPREHENSIVE METABOLIC PANEL
ALBUMIN: 3.6 g/dL (ref 3.5–5.0)
ALK PHOS: 91 U/L (ref 38–126)
ALT: 16 U/L (ref 14–54)
ANION GAP: 7 (ref 5–15)
AST: 23 U/L (ref 15–41)
BUN: 11 mg/dL (ref 6–20)
CALCIUM: 8.4 mg/dL — AB (ref 8.9–10.3)
CO2: 26 mmol/L (ref 22–32)
Chloride: 106 mmol/L (ref 101–111)
Creatinine, Ser: 0.92 mg/dL (ref 0.44–1.00)
GFR calc Af Amer: >60 mL/min (ref >60)
GFR calc non Af Amer: >60 mL/min (ref >60)
GLUCOSE: 210 mg/dL — AB (ref 65–99)
Potassium: 3.9 mmol/L (ref 3.5–5.1)
SODIUM: 139 mmol/L (ref 135–145)
Total Bilirubin: 0.5 mg/dL (ref 0.3–1.2)
Total Protein: 6.1 g/dL — ABNORMAL LOW (ref 6.5–8.1)

## 2015-12-31 LAB — CBC WITH DIFFERENTIAL/PLATELET
BASOS ABS: 0.1 10*3/uL (ref 0.0–0.1)
BASOS PCT: 1 %
EOS ABS: 0.2 10*3/uL (ref 0.0–0.7)
Eosinophils Relative: 4 %
HCT: 31.9 % — ABNORMAL LOW (ref 36.0–46.0)
HEMOGLOBIN: 10.6 g/dL — AB (ref 12.0–15.0)
Lymphocytes Relative: 36 %
Lymphs Abs: 2 10*3/uL (ref 0.7–4.0)
MCH: 31.4 pg (ref 26.0–34.0)
MCHC: 33.2 g/dL (ref 30.0–36.0)
MCV: 94.4 fL (ref 78.0–100.0)
Monocytes Absolute: 0.3 10*3/uL (ref 0.1–1.0)
Monocytes Relative: 6 %
NEUTROS PCT: 53 %
Neutro Abs: 3.1 10*3/uL (ref 1.7–7.7)
Platelets: 302 10*3/uL (ref 150–400)
RBC: 3.38 MIL/uL — ABNORMAL LOW (ref 3.87–5.11)
RDW: 13.3 % (ref 11.5–15.5)
WBC: 5.7 10*3/uL (ref 4.0–10.5)

## 2015-12-31 MED ORDER — DIPHENHYDRAMINE HCL 50 MG/ML IJ SOLN
INTRAMUSCULAR | Status: AC
  Filled 2015-12-31: qty 1

## 2015-12-31 MED ORDER — PACLITAXEL CHEMO INJECTION 300 MG/50ML
80.0000 mg/m2 | Freq: Once | INTRAVENOUS | Status: AC
  Administered 2015-12-31: 120 mg via INTRAVENOUS
  Filled 2015-12-31: qty 20

## 2015-12-31 MED ORDER — FAMOTIDINE IN NACL 20-0.9 MG/50ML-% IV SOLN
20.0000 mg | Freq: Once | INTRAVENOUS | Status: AC
  Administered 2015-12-31: 20 mg via INTRAVENOUS

## 2015-12-31 MED ORDER — SODIUM CHLORIDE 0.9 % IJ SOLN: 10.0000 mL | INTRAMUSCULAR | Status: DC | PRN

## 2015-12-31 MED ORDER — SODIUM CHLORIDE 0.9 % IV SOLN
Freq: Once | INTRAVENOUS | Status: AC
  Administered 2015-12-31: 13:00:00 via INTRAVENOUS
  Filled 2015-12-31: qty 4

## 2015-12-31 MED ORDER — TRASTUZUMAB CHEMO INJECTION 440 MG
2.0000 mg/kg | Freq: Once | INTRAVENOUS | Status: AC
  Administered 2015-12-31: 105 mg via INTRAVENOUS
  Filled 2015-12-31: qty 5

## 2015-12-31 MED ORDER — HEPARIN SOD (PORK) LOCK FLUSH 100 UNIT/ML IV SOLN
500.0000 [IU] | Freq: Once | INTRAVENOUS | Status: AC | PRN
  Administered 2015-12-31: 500 [IU]
  Filled 2015-12-31: qty 5

## 2015-12-31 MED ORDER — DIPHENHYDRAMINE HCL 50 MG/ML IJ SOLN
50.0000 mg | Freq: Once | INTRAMUSCULAR | Status: AC
  Administered 2015-12-31: 50 mg via INTRAVENOUS

## 2015-12-31 MED ORDER — FAMOTIDINE IN NACL 20-0.9 MG/50ML-% IV SOLN
INTRAVENOUS | Status: AC
  Filled 2015-12-31: qty 50

## 2015-12-31 MED ORDER — ACETAMINOPHEN 325 MG PO TABS
ORAL_TABLET | ORAL | Status: AC
  Filled 2015-12-31: qty 2

## 2015-12-31 MED ORDER — ACETAMINOPHEN 325 MG PO TABS
650.0000 mg | ORAL_TABLET | Freq: Once | ORAL | Status: AC
  Administered 2015-12-31: 650 mg via ORAL

## 2015-12-31 MED ORDER — SODIUM CHLORIDE 0.9 % IV SOLN
Freq: Once | INTRAVENOUS | Status: AC
  Administered 2015-12-31: 12:00:00 via INTRAVENOUS

## 2015-12-31 NOTE — Progress Notes (Signed)
[place] at [facility] Progress Note  Patient Care Team: Lajean Manes, MD as PCP - General  CHIEF COMPLAINTS:  Invasive ductal carcinoma triple positive  ER+, PR+, HER 2 neu +  RIGHT breast cancer, upper inner quadrant Screening mammogram on 09/04/2015 with possible R breast mass 2 densities noted in the R breast upper inner quadrant within a centimeter of each other (9 and 10 cm from the nipple) 11/03/2015 partial mastectomy, sentinel node biopsy and port placement with Dr. Brantley Stage Final pathology pT1cN0M0 ER+PR+ Her 2 neu+ R breast carcinoma  HISTORY OF PRESENTING ILLNESS:  Lori Greene 75 y.o. female is here because of abnormal mammogram of the R breast and newly diagnosed R breast cancer, disease is HER 2 neu positive.  Mrs. Simonton is accompanied by her husband. She is here for cycle #6 herceptin and paclitaxel today.  She is feeling well. Denies any nausea, vomiting, or diarrhea. Denies dysuria. Her fingernails are beginning to look slightly abnormal. Her breathing has improved with no issues. Reports she has issues sleeping on Thursday night because she often sleeps during treatment.   Reports some discomfort of her port, stating "sometimes it feels like it's pulling". She denies any other problems.   She does not need any refills at this time.   MEDICAL HISTORY:  Past Medical History  Diagnosis Date  . Hypertension   . Diabetes mellitus without complication ([place])   . Asthma   . GERD (gastroesophageal reflux disease)   . Cancer [facility]) 2016    right breast    SURGICAL HISTORY: Past Surgical History  Procedure Laterality Date  . Abdominal hysterectomy  1983  . Nasal fracture surgery    . Breast lumpectomy with radioactive seed and sentinel lymph node biopsy Right 11/03/2015    Procedure: RIGHT BREAST LUMPECTOMY WITH RADIOACTIVE SEED AND SENTINEL LYMPH NODE MAPPING;  Surgeon: Erroll Luna, MD;  Location: [place];  Service: General;   Laterality: Right;  . Portacath placement Right 11/03/2015    Procedure: INSERTION PORT-A-CATH;  Surgeon: Erroll Luna, MD;  Location: [place];  Service: General;  Laterality: Right;    SOCIAL HISTORY: Social History   Social History  . Marital Status: Married    Spouse Name: N/A  . Number of Children: N/A  . Years of Education: N/A   Occupational History  . retired    Social History Main Topics  . Smoking status: Never Smoker   . Smokeless tobacco: Never Used  . Alcohol Use: No  . Drug Use: No  . Sexual Activity: No   Other Topics Concern  . Not on file   Social History Narrative  Married 51 years. 0 children. Fostered children. 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 She likes to sew, knit, and quilt. She likes to walk but does not do it often. Non smoker ETOH, none She is from [place] and moved to [place] when she got married.  FAMILY HISTORY: Family History  Problem Relation Age of Onset  . Breast cancer Mother   . Leukemia Father    has no family status information on file.  Father died at 23 yo of leukemia Mother died of old age; breast cancer diagnosis at 74 yo. She had a biopsy, no treatment. 1 sister, healthy. No breast cancer.  ALLERGIES:  is allergic to losartan and penicillins.  MEDICATIONS:  Current Outpatient Prescriptions  Medication Sig Dispense Refill  . albuterol (PROAIR HFA) 108 (90  BASE) MCG/ACT inhaler Inhale 2 puffs into the lungs every 6 (six) hours as needed for wheezing or shortness of breath.    Marland Kitchen atorvastatin (LIPITOR) 10 MG tablet Take 10 mg by mouth daily.    . budesonide-formoterol (SYMBICORT) 80-4.5 MCG/ACT inhaler Inhale 2 puffs into the lungs 2 (two) times daily.    . Calcium Carbonate-Vitamin D (CALCIUM + D PO) Take 1 tablet by mouth every morning.    . Cholecalciferol (VITAMIN D3) 2000 units TABS Take by mouth.    . famotidine (PEPCID) 20 MG tablet Take 20 mg by mouth 2  (two) times daily.    Marland Kitchen lidocaine-prilocaine (EMLA) cream Apply a quarter size amount to port site 1 hour prior to chemo. Do not rub in. Cover with plastic wrap. 30 g 3  . lisinopril (PRINIVIL,ZESTRIL) 5 MG tablet Take 5 mg by mouth daily.    . metFORMIN (GLUCOPHAGE) 500 MG tablet Take 500 mg by mouth daily with breakfast.     . montelukast (SINGULAIR) 10 MG tablet     . omeprazole (PRILOSEC) 40 MG capsule Take 40 mg by mouth daily.    . ondansetron (ZOFRAN) 8 MG tablet Take 1 tablet (8 mg total) by mouth every 8 (eight) hours as needed for nausea or vomiting. 30 tablet 2  . PACLitaxel (TAXOL) 300 MG/50ML injection Inject into the vein. To start weekly Taxol x 12 weeks on January 12.    . prochlorperazine (COMPAZINE) 10 MG tablet Take 1 tablet (10 mg total) by mouth every 6 (six) hours as needed for nausea or vomiting. 30 tablet 2  . Trastuzumab (HERCEPTIN IV) Inject into the vein. To start on November 26, 2015.    Marland Kitchen dextromethorphan-guaiFENesin (MUCINEX DM) 30-600 MG 12hr tablet Take 1 tablet by mouth 2 (two) times daily. Reported on 12/31/2015     No current facility-administered medications for this visit.    Review of Systems  Constitutional: Negative for fever, chills and malaise/fatigue.  HENT: Negative.  Negative for congestion, hearing loss, nosebleeds, sore throat and tinnitus.   Eyes: Negative.  Negative for blurred vision, double vision, pain and discharge.  Respiratory: Negative.  Negative for cough, hemoptysis, sputum production, shortness of breath and wheezing.   Cardiovascular: Negative.  Negative for chest pain, palpitations, claudication, leg swelling and PND.  Gastrointestinal: Negative for heartburn, nausea, abdominal pain, diarrhea, constipation, blood in stool and melena.  Genitourinary: Negative.  Negative for dysuria, urgency, frequency and hematuria.  Musculoskeletal: Negative.  Negative for myalgias, joint pain and falls.  Skin: Negative.  Negative for itching and  rash.  Neurological: Positive for tingling. Negative for dizziness, tremors, sensory change, speech change, focal weakness, seizures, loss of consciousness, weakness and headaches.       Tingling in toes, worse at night.  Endo/Heme/Allergies: Negative.  Does not bruise/bleed easily.  Psychiatric/Behavioral: Negative for depression, suicidal ideas, memory loss and substance abuse. The patient is not nervous/anxious.   All other systems reviewed and are negative.  14 point ROS was done and is otherwise as detailed above or in HPI  PHYSICAL EXAMINATION: ECOG PERFORMANCE STATUS: 0 - Asymptomatic   Vitals with BMI 12/31/2015  Height   Weight 117 lbs  BMI   Systolic 0000000  Diastolic 71  Pulse 78  Respirations 16   Physical Exam  Constitutional: She is oriented to person, place, and time and well-developed, well-nourished, and in no distress.  Wears glasses.  HENT:  Head: Normocephalic and atraumatic.  Nose: Nose normal.  Mouth/Throat: Oropharynx is  clear and moist. No oropharyngeal exudate.  Eyes: Conjunctivae and EOM are normal. Pupils are equal, round, and reactive to light. Right eye exhibits no discharge. Left eye exhibits no discharge. No scleral icterus.  Neck: Normal range of motion. Neck supple. No tracheal deviation present. No thyromegaly present.  Cardiovascular: Normal rate, regular rhythm and normal heart sounds.  Exam reveals no gallop and no friction rub.   No murmur heard. Pulmonary/Chest: Effort normal and breath sounds normal. She has no wheezes. She has no rales.  Abdominal: Soft. Bowel sounds are normal. She exhibits no distension and no mass. There is no tenderness. There is no rebound and no guarding.  Musculoskeletal: Normal range of motion. She exhibits no edema.  Lymphadenopathy:    She has no cervical adenopathy.  Neurological: She is alert and oriented to person, place, and time. She has normal reflexes. No cranial nerve deficit. Gait normal. Coordination  normal.  Skin: Skin is warm and dry. No rash noted.  Psychiatric: Mood, memory, affect and judgment normal.  Nursing note and vitals reviewed.   LABORATORY DATA:  I have reviewed the data as listed Lab Results  Component Value Date   WBC 5.7 12/31/2015   HGB 10.6* 12/31/2015   HCT 31.9* 12/31/2015   MCV 94.4 12/31/2015   PLT 302 12/31/2015   CMP     Component Value Date/Time   NA 139 12/31/2015 1020   K 3.9 12/31/2015 1020   CL 106 12/31/2015 1020   CO2 26 12/31/2015 1020   GLUCOSE 210* 12/31/2015 1020   BUN 11 12/31/2015 1020   CREATININE 0.92 12/31/2015 1020   CALCIUM 8.4* 12/31/2015 1020   PROT 6.1* 12/31/2015 1020   ALBUMIN 3.6 12/31/2015 1020   AST 23 12/31/2015 1020   ALT 16 12/31/2015 1020   ALKPHOS 91 12/31/2015 1020   BILITOT 0.5 12/31/2015 1020   GFRNONAA >60 12/31/2015 1020   GFRAA >60 12/31/2015 1020    RADIOGRAPHIC STUDIES: I have personally reviewed the radiological images as listed and agreed with the findings in the report. Study Result     CLINICAL DATA: Breast cancer. Patient may receive cardiotoxic chemotherapy.  EXAM: NUCLEAR MEDICINE CARDIAC BLOOD POOL IMAGING (MUGA)  TECHNIQUE: Cardiac multi-gated acquisition was performed at rest following intravenous injection of Tc-72m labeled red blood cells.  RADIOPHARMACEUTICALS: 25.5 mCi Tc-67m MDP in-vitro labeled red blood cells IV  COMPARISON: None  FINDINGS: The left ventricular ejection fraction is calculated equal 71.9%. Gated images show normal left ventricular wall motion.  IMPRESSION: 1. Normal left ventricular ejection fraction equal to 71.9%.   Electronically Signed  By: Kerby Moors M.D.  On: 11/21/2015 13:00    PATHOLOGY:   ASSESSMENT & PLAN:  Invasive ductal carcinoma triple positive  ER+ (100%), PR+ (10%), HER 2 neu +  RIGHT breast cancer, upper inner quadrant Screening mammogram on 09/04/2015 with possible R breast mass 2 densities noted in the R  breast upper inner quadrant within a centimeter of each other (9 and 10 cm from the nipple) 11/03/2015 partial mastectomy, sentinel node biopsy and port placement with Dr. Brantley Stage Final pathology pT1cN0M0 ER+PR+ Her 2 neu+ R breast carcinoma  She is doing well. The patient is here for cycle #6 herceptin and paclitaxel today.  Her next cycle of treatment is scheduled for next week on 2/23. She will return for follow up at this next treatment cycle.  All questions were answered. The patient knows to call the clinic with any problems, questions or concerns.  This note  was electronically signed.   This document serves as a record of services personally performed by Ancil Linsey, MD. It was created on her behalf by Arlyce Harman, a trained medical scribe. The creation of this record is based on the scribe's personal observations and the provider's statements to them. This document has been checked and approved by the attending provider.  I have reviewed the above documentation for accuracy and completeness, and I agree with the above.   Molli Hazard, MD  01/04/2016 8:56 AM

## 2015-12-31 NOTE — Patient Instructions (Addendum)
[place] at [facility] Discharge Instructions  RECOMMENDATIONS MADE BY THE CONSULTANT AND ANY TEST RESULTS WILL BE SENT TO YOUR REFERRING PHYSICIAN.   Exam and discussion by Dr Whitney Muse today Taxol/herceptin weekly Return to see the doctor in 1 week If you have any problems before you return please call us. Please call the clinic if you have any questions or concerns   Thank you for choosing [place] at [facility] to provide your oncology and hematology care.  To afford each patient quality time with our provider, please arrive at least 15 minutes before your scheduled appointment time.   Beginning January 23rd 2017 lab work for the [facility] will be done in the  Main lab at [facility] on 1st floor. If you have a lab appointment with the [facility] please come in thru the  Main Entrance and check in at the main information desk  You need to re-schedule your appointment should you arrive 10 or more minutes late.  We strive to give you quality time with our providers, and arriving late affects you and other patients whose appointments are after yours.  Also, if you no show three or more times for appointments you may be dismissed from the clinic at the providers discretion.     Again, thank you for choosing [facility].  Our hope is that these requests will decrease the amount of time that you wait before being seen by our physicians.       _____________________________________________________________  Should you have questions after your visit to [facility], please contact our office at [phone] between the hours of 8:30 a.m. and 4:30 p.m.  Voicemails left after 4:30 p.m. will not be returned until the following business day.  For prescription refill requests, have your pharmacy contact our office.

## 2015-12-31 NOTE — Progress Notes (Signed)
Patient tolerated infusion well.  VSS.   

## 2015-12-31 NOTE — Patient Instructions (Signed)
[facility] Discharge Instructions for Patients Receiving Chemotherapy   Beginning January 23rd 2017 lab work for the [facility] will be done in the  Main lab at [facility] on 1st floor. If you have a lab appointment with the [facility] please come in thru the  Main Entrance and check in at the main information desk   Today you received the following chemotherapy agents: Taxol and herceptin.   Please see MD visit notes for more information.     If you develop nausea and vomiting, or diarrhea that is not controlled by your medication, call the clinic.  The clinic phone number is [phone]. Office hours are Monday-Friday 8:30am-5:00pm.  BELOW ARE SYMPTOMS THAT SHOULD BE REPORTED IMMEDIATELY:  *FEVER GREATER THAN 101.0 F  *CHILLS WITH OR WITHOUT FEVER  NAUSEA AND VOMITING THAT IS NOT CONTROLLED WITH YOUR NAUSEA MEDICATION  *UNUSUAL SHORTNESS OF BREATH  *UNUSUAL BRUISING OR BLEEDING  TENDERNESS IN MOUTH AND THROAT WITH OR WITHOUT PRESENCE OF ULCERS  *URINARY PROBLEMS  *BOWEL PROBLEMS  UNUSUAL RASH Items with * indicate a potential emergency and should be followed up as soon as possible. If you have an emergency after office hours please contact your primary care physician or go to the nearest emergency department.  Please call the clinic during office hours if you have any questions or concerns.   You may also contact the Patient Navigator at [phone] should you have any questions or need assistance in obtaining follow up care.

## 2016-01-01 ENCOUNTER — Encounter (HOSPITAL_COMMUNITY): Payer: Self-pay | Admitting: Hematology & Oncology

## 2016-01-02 NOTE — Progress Notes (Signed)
at [facility] NOTE  Patient Care Team: Lajean Manes, MD as PCP - General  CHIEF COMPLAINTS/PURPOSE OF CONSULTATION:  Invasive ductal carcinoma triple positive  ER+, PR+, HER 2 neu +  RIGHT breast cancer, upper inner quadrant Screening mammogram on 09/04/2015 with possible R breast mass 2 densities noted in the R breast upper inner quadrant within a centimeter of each other (9 and 10 cm from the nipple) 11/03/2015 partial mastectomy, sentinel node biopsy and port placement with Dr. Brantley Stage Final pathology [id number] ER+PR+ Her 2 neu+ R breast carcinoma   HISTORY OF PRESENTING ILLNESS:  Lori Greene 75 y.o. female is here for follow-up of her breast cancer.  Lori Greene is accompanied by her husband. She is feeling okay with treatment. She only had to take one nausea pill and has experienced no nausea today. She has been eating relatively well and slept well. She felt really good on Sunday and Tuesday, however on Monday she felt very fatigued. Notes that she preventatively rinsed her mouth with salt water. Denies diarrhea, though she has had some loose stool that is not intolerable. Denies trouble breathing.   She realistically is doing well with few complaints.   MEDICAL HISTORY:  Past Medical History  Diagnosis Date  . Hypertension   . Diabetes mellitus without complication ([place])   . Asthma   . GERD (gastroesophageal reflux disease)   . Cancer [facility]) 2016    right breast    SURGICAL HISTORY: Past Surgical History  Procedure Laterality Date  . Abdominal hysterectomy  1983  . Nasal fracture surgery    . Breast lumpectomy with radioactive seed and sentinel lymph node biopsy Right 11/03/2015    Procedure: RIGHT BREAST LUMPECTOMY WITH RADIOACTIVE SEED AND SENTINEL LYMPH NODE MAPPING;  Surgeon: Erroll Luna, MD;  Location: [facility];  Service: General;  Laterality: Right;  . Portacath placement Right 11/03/2015    Procedure:  INSERTION PORT-A-CATH;  Surgeon: Erroll Luna, MD;  Location: [facility];  Service: General;  Laterality: Right;    SOCIAL HISTORY: Social History   Social History  . Marital Status: Married    Spouse Name: N/A  . Number of Children: N/A  . Years of Education: N/A   Occupational History  . retired    Social History Main Topics  . Smoking status: Never Smoker   . Smokeless tobacco: Never Used  . Alcohol Use: No  . Drug Use: No  . Sexual Activity: No   Other Topics Concern  . Not on file   Social History Narrative  Married 51 years. 0 children. Fostered children. 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 She likes to sew, knit, and quilt. She likes to walk but does not do it often. Non smoker ETOH, none She is from [facility] and moved to [place] when she got married.  FAMILY HISTORY: Family History  Problem Relation Age of Onset  . Breast cancer Mother   . Leukemia Father    has no family status information on file.  Father died at 66 yo of leukemia Mother died of old age; breast cancer diagnosis at 74 yo. She had a biopsy, no treatment. 1 sister, healthy. No breast cancer.  ALLERGIES:  is allergic to losartan and penicillins.  MEDICATIONS:  Current Outpatient Prescriptions  Medication Sig Dispense Refill  . albuterol (PROAIR HFA) 108 (90 BASE) MCG/ACT inhaler Inhale 2 puffs into the lungs every 6 (six) hours as  needed for wheezing or shortness of breath.    Marland Kitchen atorvastatin (LIPITOR) 10 MG tablet Take 10 mg by mouth daily.    . budesonide-formoterol (SYMBICORT) 80-4.5 MCG/ACT inhaler Inhale 2 puffs into the lungs 2 (two) times daily.    . Calcium Carbonate-Vitamin D (CALCIUM + D PO) Take 1 tablet by mouth every morning.    . Cholecalciferol (VITAMIN D3) 2000 units TABS Take by mouth.    . dextromethorphan-guaiFENesin (MUCINEX DM) 30-600 MG 12hr tablet Take 1 tablet by mouth 2 (two) times daily. Reported on 12/31/2015     . famotidine (PEPCID) 20 MG tablet Take 20 mg by mouth 2 (two) times daily.    Marland Kitchen lidocaine-prilocaine (EMLA) cream Apply a quarter size amount to port site 1 hour prior to chemo. Do not rub in. Cover with plastic wrap. 30 g 3  . lisinopril (PRINIVIL,ZESTRIL) 5 MG tablet Take 5 mg by mouth daily.    . metFORMIN (GLUCOPHAGE) 500 MG tablet Take 500 mg by mouth daily with breakfast.     . montelukast (SINGULAIR) 10 MG tablet     . omeprazole (PRILOSEC) 40 MG capsule Take 40 mg by mouth daily.    . ondansetron (ZOFRAN) 8 MG tablet Take 1 tablet (8 mg total) by mouth every 8 (eight) hours as needed for nausea or vomiting. 30 tablet 2  . PACLitaxel (TAXOL) 300 MG/50ML injection Inject into the vein. To start weekly Taxol x 12 weeks on January 12.    . prochlorperazine (COMPAZINE) 10 MG tablet Take 1 tablet (10 mg total) by mouth every 6 (six) hours as needed for nausea or vomiting. 30 tablet 2  . Trastuzumab (HERCEPTIN IV) Inject into the vein. To start on November 26, 2015.     No current facility-administered medications for this visit.    Review of Systems  Constitutional: Negative.  Negative for fever, chills, weight loss and malaise/fatigue.  HENT: Negative.  Negative for congestion, hearing loss, nosebleeds, sore throat and tinnitus.   Eyes: Negative.  Negative for blurred vision, double vision, pain and discharge.  Respiratory: Negative.  Negative for cough, hemoptysis, sputum production, shortness of breath and wheezing.   Cardiovascular: Negative.  Negative for chest pain, palpitations, claudication, leg swelling and PND.  Gastrointestinal: Negative.  Negative for heartburn, nausea, vomiting, abdominal pain, diarrhea, constipation, blood in stool and melena.  Genitourinary: Negative.  Negative for dysuria, urgency, frequency and hematuria.  Musculoskeletal: Negative.  Negative for myalgias, joint pain and falls.  Skin: Negative.  Negative for itching and rash.  Neurological: Negative.   Negative for dizziness, tingling, tremors, sensory change, speech change, focal weakness, seizures, loss of consciousness, weakness and headaches.  Endo/Heme/Allergies: Negative.  Does not bruise/bleed easily.  Psychiatric/Behavioral: Negative.  Negative for depression, suicidal ideas, memory loss and substance abuse. The patient is not nervous/anxious and does not have insomnia.   All other systems reviewed and are negative.  14 point ROS was done and is otherwise as detailed above or in HPI   PHYSICAL EXAMINATION: ECOG PERFORMANCE STATUS: 0 - Asymptomatic  Filed Vitals:   12/03/15 0832  BP: 147/67  Pulse: 83  Temp: 97.8 F (36.6 C)  Resp: 18   Filed Weights   12/03/15 0832  Weight: 116 lb 9.6 oz (52.889 kg)    Physical Exam  Constitutional: She is oriented to person, place, and time and well-developed, well-nourished, and in no distress.  HENT:  Head: Normocephalic and atraumatic.  Nose: Nose normal.  Mouth/Throat: Oropharynx is clear and moist.  No oropharyngeal exudate.  Eyes: Conjunctivae and EOM are normal. Pupils are equal, round, and reactive to light. Right eye exhibits no discharge. Left eye exhibits no discharge. No scleral icterus.  Neck: Normal range of motion. Neck supple. No tracheal deviation present. No thyromegaly present.  Cardiovascular: Normal rate, regular rhythm and normal heart sounds.  Exam reveals no gallop and no friction rub.   No murmur heard. Pulmonary/Chest: Effort normal. She has no rales. CTAB Abdominal: Soft. Bowel sounds are normal. She exhibits no distension and no mass. There is no tenderness. There is no rebound and no guarding.  Musculoskeletal: Normal range of motion. She exhibits no edema.  Lymphadenopathy:    She has no cervical adenopathy.  Neurological: She is alert and oriented to person, place, and time. She has normal reflexes. No cranial nerve deficit. Gait normal. Coordination normal.  Skin: Skin is warm and dry. No rash noted.    Psychiatric: Mood, memory, affect and judgment normal.  Nursing note and vitals reviewed.   LABORATORY DATA:  I have reviewed the data as listed Lab Results  Component Value Date   WBC 5.7 12/31/2015   HGB 10.6* 12/31/2015   HCT 31.9* 12/31/2015   MCV 94.4 12/31/2015   PLT 302 12/31/2015   CMP     Component Value Date/Time   NA 139 12/31/2015 1020   K 3.9 12/31/2015 1020   CL 106 12/31/2015 1020   CO2 26 12/31/2015 1020   GLUCOSE 210* 12/31/2015 1020   BUN 11 12/31/2015 1020   CREATININE 0.92 12/31/2015 1020   CALCIUM 8.4* 12/31/2015 1020   PROT 6.1* 12/31/2015 1020   ALBUMIN 3.6 12/31/2015 1020   AST 23 12/31/2015 1020   ALT 16 12/31/2015 1020   ALKPHOS 91 12/31/2015 1020   BILITOT 0.5 12/31/2015 1020   GFRNONAA >60 12/31/2015 1020   GFRAA >60 12/31/2015 1020     RADIOGRAPHIC STUDIES: I have personally reviewed the radiological images as listed and agreed with the findings in the report.  CLINICAL DATA: Post biopsy mammogram of the right breast for clip placement.  EXAM: DIAGNOSTIC RIGHT MAMMOGRAM POST ULTRASOUND BIOPSY  COMPARISON: Previous exam(s).  FINDINGS: Mammographic images were obtained following ultrasound guided biopsy of 2 small masses in the upper inner quadrant of the right breast. A coil shaped biopsy marking clip and a ribbon shaped biopsy marking clip are appropriately positioned at the intended site of biopsy in the right breast at approximately 1 o'clock, at 9 and 10 cm from the nipple respectively.  IMPRESSION: Appropriate positioning of the 2 biopsy marking clips in the upper-inner quadrant of the right breast at 1 o'clock.  Final Assessment: Post Procedure Mammograms for Marker Placement   Electronically Signed  By: Ammie Ferrier M.D.  On: 09/21/2015 16:16   ASSESSMENT & PLAN:  Invasive ductal carcinoma triple positive  ER+ (100%), PR+ (10%), HER 2 neu +  RIGHT breast cancer, upper inner quadrant Screening  mammogram on 09/04/2015 with possible R breast mass 2 densities noted in the R breast upper inner quadrant within a centimeter of each other (9 and 10 cm from the nipple) 11/03/2015 partial mastectomy, sentinel node biopsy and port placement with Dr. Brantley Stage Final pathology pT1cN0M0 ER+PR+ Her 2 neu+ R breast carcinoma Weekly Taxol/Herceptin   She is here today for ongoing therapy.  She is doing well. We will continue to see her weekly. I reviewed labs with her today.  She is cleared to proceed with her next cycle.  All questions were answered.  The patient knows to call the clinic with any problems, questions or concerns.  This note was electronically signed.   This document serves as a record of services personally performed by Ancil Linsey, MD. It was created on her behalf by Arlyce Harman, a trained medical scribe. The creation of this record is based on the scribe's personal observations and the provider's statements to them. This document has been checked and approved by the attending provider.  I have reviewed the above documentation for accuracy and completeness, and I agree with the above.   Molli Hazard, MD  01/02/2016 10:52 AM

## 2016-01-07 ENCOUNTER — Encounter (HOSPITAL_COMMUNITY): Payer: Self-pay | Admitting: Oncology

## 2016-01-07 ENCOUNTER — Encounter (HOSPITAL_BASED_OUTPATIENT_CLINIC_OR_DEPARTMENT_OTHER): Payer: Medicare Other | Admitting: Oncology

## 2016-01-07 ENCOUNTER — Encounter (HOSPITAL_BASED_OUTPATIENT_CLINIC_OR_DEPARTMENT_OTHER): Payer: Medicare Other

## 2016-01-07 ENCOUNTER — Inpatient Hospital Stay (HOSPITAL_COMMUNITY): Payer: Medicare Other

## 2016-01-07 VITALS — BP 156/70 | HR 80 | Temp 98.1°F | Resp 18 | Wt 117.9 lb

## 2016-01-07 DIAGNOSIS — C50211 Malignant neoplasm of upper-inner quadrant of right female breast: Secondary | ICD-10-CM

## 2016-01-07 DIAGNOSIS — Z5112 Encounter for antineoplastic immunotherapy: Secondary | ICD-10-CM

## 2016-01-07 DIAGNOSIS — Z170 Estrogen receptor positive status [ER+]: Secondary | ICD-10-CM | POA: Diagnosis not present

## 2016-01-07 DIAGNOSIS — Z5111 Encounter for antineoplastic chemotherapy: Secondary | ICD-10-CM

## 2016-01-07 DIAGNOSIS — D649 Anemia, unspecified: Secondary | ICD-10-CM | POA: Diagnosis not present

## 2016-01-07 LAB — COMPREHENSIVE METABOLIC PANEL
ALBUMIN: 3.4 g/dL — AB (ref 3.5–5.0)
ALT: 15 U/L (ref 14–54)
ANION GAP: 7 (ref 5–15)
AST: 19 U/L (ref 15–41)
Alkaline Phosphatase: 82 U/L (ref 38–126)
BILIRUBIN TOTAL: 0.7 mg/dL (ref 0.3–1.2)
BUN: 10 mg/dL (ref 6–20)
CHLORIDE: 106 mmol/L (ref 101–111)
CO2: 26 mmol/L (ref 22–32)
Calcium: 8.4 mg/dL — ABNORMAL LOW (ref 8.9–10.3)
Creatinine, Ser: 0.9 mg/dL (ref 0.44–1.00)
GFR calc Af Amer: >60 mL/min (ref >60)
GFR calc non Af Amer: >60 mL/min (ref >60)
GLUCOSE: 210 mg/dL — AB (ref 65–99)
POTASSIUM: 3.5 mmol/L (ref 3.5–5.1)
SODIUM: 139 mmol/L (ref 135–145)
TOTAL PROTEIN: 5.8 g/dL — AB (ref 6.5–8.1)

## 2016-01-07 LAB — CBC WITH DIFFERENTIAL/PLATELET
BASOS ABS: 0.1 10*3/uL (ref 0.0–0.1)
BASOS PCT: 2 %
EOS ABS: 0.2 10*3/uL (ref 0.0–0.7)
EOS PCT: 3 %
HCT: 32.5 % — ABNORMAL LOW (ref 36.0–46.0)
Hemoglobin: 10.7 g/dL — ABNORMAL LOW (ref 12.0–15.0)
Lymphocytes Relative: 31 %
Lymphs Abs: 1.8 10*3/uL (ref 0.7–4.0)
MCH: 31 pg (ref 26.0–34.0)
MCHC: 32.9 g/dL (ref 30.0–36.0)
MCV: 94.2 fL (ref 78.0–100.0)
MONO ABS: 0.3 10*3/uL (ref 0.1–1.0)
MONOS PCT: 5 %
Neutro Abs: 3.4 10*3/uL (ref 1.7–7.7)
Neutrophils Relative %: 59 %
PLATELETS: 274 10*3/uL (ref 150–400)
RBC: 3.45 MIL/uL — ABNORMAL LOW (ref 3.87–5.11)
RDW: 13.3 % (ref 11.5–15.5)
WBC: 5.8 10*3/uL (ref 4.0–10.5)

## 2016-01-07 MED ORDER — FAMOTIDINE IN NACL 20-0.9 MG/50ML-% IV SOLN
20.0000 mg | Freq: Once | INTRAVENOUS | Status: AC
  Administered 2016-01-07: 20 mg via INTRAVENOUS
  Filled 2016-01-07: qty 50

## 2016-01-07 MED ORDER — HEPARIN SOD (PORK) LOCK FLUSH 100 UNIT/ML IV SOLN
500.0000 [IU] | Freq: Once | INTRAVENOUS | Status: AC | PRN
  Administered 2016-01-07: 500 [IU]
  Filled 2016-01-07: qty 5

## 2016-01-07 MED ORDER — SODIUM CHLORIDE 0.9 % IJ SOLN
10.0000 mL | INTRAMUSCULAR | Status: DC | PRN
  Administered 2016-01-07: 10 mL
  Filled 2016-01-07: qty 10

## 2016-01-07 MED ORDER — DIPHENHYDRAMINE HCL 50 MG/ML IJ SOLN
50.0000 mg | Freq: Once | INTRAMUSCULAR | Status: AC
  Administered 2016-01-07: 50 mg via INTRAVENOUS
  Filled 2016-01-07: qty 1

## 2016-01-07 MED ORDER — SODIUM CHLORIDE 0.9 % IV SOLN
Freq: Once | INTRAVENOUS | Status: AC
  Administered 2016-01-07: 11:00:00 via INTRAVENOUS
  Filled 2016-01-07: qty 4

## 2016-01-07 MED ORDER — ACETAMINOPHEN 325 MG PO TABS
650.0000 mg | ORAL_TABLET | Freq: Once | ORAL | Status: AC
  Administered 2016-01-07: 650 mg via ORAL
  Filled 2016-01-07: qty 2

## 2016-01-07 MED ORDER — SODIUM CHLORIDE 0.9 % IV SOLN
Freq: Once | INTRAVENOUS | Status: AC
  Administered 2016-01-07: 10:00:00 via INTRAVENOUS

## 2016-01-07 MED ORDER — PACLITAXEL CHEMO INJECTION 300 MG/50ML
80.0000 mg/m2 | Freq: Once | INTRAVENOUS | Status: AC
  Administered 2016-01-07: 120 mg via INTRAVENOUS
  Filled 2016-01-07: qty 20

## 2016-01-07 MED ORDER — SODIUM CHLORIDE 0.9 % IV SOLN: Freq: Once | INTRAVENOUS | Status: DC

## 2016-01-07 MED ORDER — TRASTUZUMAB CHEMO INJECTION 440 MG
2.0000 mg/kg | Freq: Once | INTRAVENOUS | Status: AC
  Administered 2016-01-07: 105 mg via INTRAVENOUS
  Filled 2016-01-07: qty 5

## 2016-01-07 NOTE — Progress Notes (Signed)
Mathews Argyle, MD [address] [place] [address]  Breast cancer of upper-inner quadrant of right female breast ([place]) - Plan: [facility] [place]  CURRENT THERAPY: Weekly Paclitaxel and Herceptin beginning on 11/26/2015.  INTERVAL HISTORY: Lori Greene 75 y.o. female returns for followup of invasive ductal carcinoma of right breast, Stage IA (T1cN0M0), with multifocal disease in upper inner quadrant measuring 1.2 and 1.4 cm respectively, the largest lesion being grade 3 (smaller lesion in grade 2), ER/PR/HER2 POSITIVE.    Breast cancer of upper-inner quadrant of right female breast ([place])   09/01/2015 Mammogram BI-RADS CATEGORY 0: Incomplete. Need additional imaging evaluation and/or prior mammograms for comparison.   09/15/2015 Imaging CT chest- Right lower lobe 4 mm solid pulmonary nodule. If the patient is at high risk for bronchogenic carcinoma, follow-up chest CT at 1 year is recommended.   09/16/2015 Breast US US showing a small hypoechoic irregular mass with hyperechoic rim in the 1 o'clock location of R breast 10 cm from the nipple. Mass measures 0.7 x 0.5 x 0.6 cm. 2nd mass is identified in the 1 o'clock location 9 cm from the nipple 0.7 x 0.7 x 0.6 cm   09/16/2015 Mammogram Two suspicious masses in the 1 o'clock location of the right breast warranting tissue diagnosis.  No evidence for adenopathy.   09/21/2015 Pathology Results Estrogen Receptor: 100%, POSITIVE, STRONG STAINING INTENSITY Progesterone Receptor: 10%, POSITIVE, MODERATE STAINING INTENSITY Proliferation Marker Ki67: 10%.  HER2 - **POSITIVE**   09/21/2015 Mammogram Appropriate positioning of the 2 biopsy marking clips in the upper-inner quadrant of the right breast at 1 o'clock.   09/21/2015 Initial Biopsy Breast, right, needle core biopsy, 1:00 o'clock, 9 CMFN - INVASIVE DUCTAL CARCINOMA. - DUCTAL CARCINOMA IN SITU. - SEE COMMENT. 2. Breast, right, needle core biopsy, 1:00 o'clock, 10 CMFN -  INVASIVE DUCTAL CARCINOMA. - DUCTAL CARCINOMA IN SITU WITH    10/26/2015 Procedure Right chest porta cath placed with no adverse features.- Dr. Brantley Stage.   11/03/2015 Pathology Results MULTIFOCAL INVASIVE DUCTAL CARCINOMA.  INVASIVE TUMOR IS 1.0 MM FROM NEAREST MARGIN (INFERIOR).  HIGH GRADE DUCTAL CARCINOMA IN SITU WITH NECROSIS.  IN SITU CARCINOMA IS 0.1 MM FROM THE NEAREST MARGIN (INFERIOR MARGIN).   11/03/2015 Procedure Right lumpectomy by Dr. Brantley Stage.   11/17/2015 Initial Diagnosis Breast cancer of upper-inner quadrant of right female breast ([place])   11/21/2015 Imaging Normal left ventricular ejection fraction equal to 71.9%.   11/26/2015 -  Chemotherapy Paclitaxel weekly x 12    11/26/2015 -  Antibody Plan Herceptin weekly with Paclitaxel.    I personally reviewed and went over laboratory results with the patient.  The results are noted within this dictation.  Labs will be updated today.  HGB is stable; anemia is noted.  Hyperglycemia is noted.  She continues to tolerate treatment well without any side effects.  She denies any PN.  She denies any nausea, vomiting, diarrhea, and constipation.  Her breathing is at baseline.  Weight is stable.  She notes some nasal bleeding from her left nare.  She denies any fevers, chills, headaches, sinus pressure.  She denies any yellow/green nasal discharge.  She reports that her husband is "cold natured" and therefore, they still have their heat on in the house.    Past Medical History  Diagnosis Date  . Hypertension   . Diabetes mellitus without complication ([place])   . Asthma   . GERD (gastroesophageal reflux disease)   .  Cancer [facility]) 2016    right breast    has Chest crackles; Cough; Hiatal hernia; HTN (hypertension); Lung nodule; and Breast cancer of upper-inner quadrant of right female breast ([place]) on her problem list.     is allergic to losartan and penicillins.  Current Outpatient Prescriptions on File Prior to Visit  Medication Sig Dispense  Refill  . albuterol (PROAIR HFA) 108 (90 BASE) MCG/ACT inhaler Inhale 2 puffs into the lungs every 6 (six) hours as needed for wheezing or shortness of breath.    Marland Kitchen atorvastatin (LIPITOR) 10 MG tablet Take 10 mg by mouth daily.    . budesonide-formoterol (SYMBICORT) 80-4.5 MCG/ACT inhaler Inhale 2 puffs into the lungs 2 (two) times daily.    . Calcium Carbonate-Vitamin D (CALCIUM + D PO) Take 1 tablet by mouth every morning.    . Cholecalciferol (VITAMIN D3) 2000 units TABS Take by mouth.    . dextromethorphan-guaiFENesin (MUCINEX DM) 30-600 MG 12hr tablet Take 1 tablet by mouth 2 (two) times daily. Reported on 12/31/2015    . famotidine (PEPCID) 20 MG tablet Take 20 mg by mouth 2 (two) times daily.    Marland Kitchen lidocaine-prilocaine (EMLA) cream Apply a quarter size amount to port site 1 hour prior to chemo. Do not rub in. Cover with plastic wrap. 30 g 3  . lisinopril (PRINIVIL,ZESTRIL) 5 MG tablet Take 5 mg by mouth daily.    . metFORMIN (GLUCOPHAGE) 500 MG tablet Take 500 mg by mouth daily with breakfast.     . montelukast (SINGULAIR) 10 MG tablet     . omeprazole (PRILOSEC) 40 MG capsule Take 40 mg by mouth daily.    . ondansetron (ZOFRAN) 8 MG tablet Take 1 tablet (8 mg total) by mouth every 8 (eight) hours as needed for nausea or vomiting. 30 tablet 2  . PACLitaxel (TAXOL) 300 MG/50ML injection Inject into the vein. To start weekly Taxol x 12 weeks on January 12.    . prochlorperazine (COMPAZINE) 10 MG tablet Take 1 tablet (10 mg total) by mouth every 6 (six) hours as needed for nausea or vomiting. 30 tablet 2  . Trastuzumab (HERCEPTIN IV) Inject into the vein. To start on November 26, 2015.     Current Facility-Administered Medications on File Prior to Visit  Medication Dose Route Frequency Provider Last Rate Last Dose  . 0.9 %  sodium chloride infusion   Intravenous Once Patrici Ranks, MD      . acetaminophen (TYLENOL) tablet 650 mg  650 mg Oral Once Patrici Ranks, MD      . famotidine  (PEPCID) IVPB 20 mg premix  20 mg Intravenous Once Patrici Ranks, MD      . heparin lock flush 100 unit/mL  500 Units Intracatheter Once PRN Patrici Ranks, MD      . ondansetron (ZOFRAN) 8 mg, dexamethasone (DECADRON) 20 mg in sodium chloride 0.9 % 50 mL IVPB   Intravenous Once Patrici Ranks, MD      . PACLitaxel (TAXOL) 120 mg in dextrose 5 % 250 mL chemo infusion (</= '80mg'$ /m2)  80 mg/m2 (Treatment Plan Actual) Intravenous Once Patrici Ranks, MD      . sodium chloride 0.9 % injection 10 mL  10 mL Intracatheter PRN Patrici Ranks, MD   10 mL at 01/07/16 1023  . trastuzumab (HERCEPTIN) 105 mg in sodium chloride 0.9 % 250 mL chemo infusion  2 mg/kg (Treatment Plan Actual) Intravenous Once Patrici Ranks, MD  Past Surgical History  Procedure Laterality Date  . Abdominal hysterectomy  1983  . Nasal fracture surgery    . Breast lumpectomy with radioactive seed and sentinel lymph node biopsy Right 11/03/2015    Procedure: RIGHT BREAST LUMPECTOMY WITH RADIOACTIVE SEED AND SENTINEL LYMPH NODE MAPPING;  Surgeon: Erroll Luna, MD;  Location: [facility];  Service: General;  Laterality: Right;  . Portacath placement Right 11/03/2015    Procedure: INSERTION PORT-A-CATH;  Surgeon: Erroll Luna, MD;  Location: [facility];  Service: General;  Laterality: Right;    Denies any headaches, dizziness, double vision, fevers, chills, night sweats, nausea, vomiting, diarrhea, constipation, chest pain, heart palpitations, shortness of breath, blood in stool, black tarry stool, urinary pain, urinary burning, urinary frequency, hematuria.   PHYSICAL EXAMINATION  ECOG PERFORMANCE STATUS: 0 - Asymptomatic  There were no vitals taken for this visit.  BP 173/70 P 81 Resp 18 Temp 97.7 F O2 100% RA  GENERAL:alert, no distress, well nourished, well developed, comfortable, cooperative, smiling and in chemo-bed, no one at the bedside. SKIN: skin color,  texture, turgor are normal, no rashes or significant lesions HEAD: Normocephalic, No masses, lesions, tenderness or abnormalities EYES: normal, EOMI, Conjunctiva are pink and non-injected EARS: External ears normal OROPHARYNX:lips, buccal mucosa, and tongue normal and mucous membranes are moist  NECK: supple, no adenopathy, trachea midline LYMPH:  not examined BREAST:not examined LUNGS: clear to auscultation and percussion without wheezes, rales, or rhonchi. HEART: regular rate & rhythm, no murmurs, no gallops, S1 normal and S2 normal ABDOMEN:abdomen soft, non-tender and normal bowel sounds BACK: Back symmetric, no curvature. EXTREMITIES:less then 2 second capillary refill, no joint deformities, effusion, or inflammation, no edema, no skin discoloration, no clubbing, no cyanosis  NEURO: alert & oriented x 3 with fluent speech, no focal motor/sensory deficits, gait normal    LABORATORY DATA: CBC    Component Value Date/Time   WBC 5.8 01/07/2016 0847   RBC 3.45* 01/07/2016 0847   HGB 10.7* 01/07/2016 0847   HCT 32.5* 01/07/2016 0847   PLT 274 01/07/2016 0847   MCV 94.2 01/07/2016 0847   MCH 31.0 01/07/2016 0847   MCHC 32.9 01/07/2016 0847   RDW 13.3 01/07/2016 0847   LYMPHSABS 1.8 01/07/2016 0847   MONOABS 0.3 01/07/2016 0847   EOSABS 0.2 01/07/2016 0847   BASOSABS 0.1 01/07/2016 0847      Chemistry      Component Value Date/Time   NA 139 01/07/2016 0847   K 3.5 01/07/2016 0847   CL 106 01/07/2016 0847   CO2 26 01/07/2016 0847   BUN 10 01/07/2016 0847   CREATININE 0.90 01/07/2016 0847      Component Value Date/Time   CALCIUM 8.4* 01/07/2016 0847   ALKPHOS 82 01/07/2016 0847   AST 19 01/07/2016 0847   ALT 15 01/07/2016 0847   BILITOT 0.7 01/07/2016 0847        PENDING LABS:   RADIOGRAPHIC STUDIES:  No results found.   PATHOLOGY:    ASSESSMENT AND PLAN:  Breast cancer of upper-inner quadrant of right female breast ([place]) Invasive ductal carcinoma of  right breast, Stage IA (T1cN0M0), with multifocal disease in upper inner quadrant measuring 1.2 and 1.4 cm respectively, the largest lesion being grade 3 (smaller lesion in grade 2), ER/PR/HER2 POSITIVE.  S/P right lumpectomy by Dr. Brantley Stage on 11/03/2015.  Oncology history is up-to-date.  Pre-treatment labs as ordered: CBC diff, CMET.  I have recommended Ocean Spray (NS spray) to nares PRN and/or  vasoline applied via Q-tip PRN to maintain moist mucous membranes.  No signs or symptoms of sinus infection.  I suspect this is secondary to dry mucous membranes from ongoing heat at home.  S/P MUGA prior to initiation of treatment demonstrating a LVEF of 71.9%.  Will repeat in April.  Order is placed and communicated to scheduling.  She has a consultation with Rad Onc, Dr. Pablo Ledger, in March 2017.  Return in 1 week for follow-up.    THERAPY PLAN:  Continue treatment as planned with 12 cycles of weekly Paclitaxel with Herceptin weekly.  Following completion of Paclitaxel, transitioning to Herceptin every 3 weeks would be reasonable to complete 52 weeks worth of treatment.  She will then undergo bone density exam with initiation of AI therapy.  She has a consultation appointment with Dr. Pablo Ledger in March for consideration of Radiation Therapy.  She [place] to [place] than [place].  All questions were answered. The patient knows to call the clinic with any problems, questions or concerns. We can certainly see the patient much sooner if necessary.  Patient and plan discussed with Dr. Ancil Linsey and she is in agreement with the aforementioned.   This note is electronically signed by: Doy Mince 01/07/2016 10:24 AM

## 2016-01-07 NOTE — Progress Notes (Signed)
Tolerated chemo well. Ambulatory on discharge home to self. 

## 2016-01-07 NOTE — Patient Instructions (Signed)
[place] at [facility] Discharge Instructions  RECOMMENDATIONS MADE BY THE CONSULTANT AND ANY TEST RESULTS WILL BE SENT TO YOUR REFERRING PHYSICIAN.  Exam and discussion today with Kirby Crigler, PA. Ocean saline nasal spray to nostrils as needed for dryness and congestion. Apply vasoline with a q-tip to nostrils as needed for dryness.  Return as scheduled for treatments and office visit.   Thank you for choosing [place] at [facility] to provide your oncology and hematology care.  To afford each patient quality time with our provider, please arrive at least 15 minutes before your scheduled appointment time.   Beginning January 23rd 2017 lab work for the [facility] will be done in the  Main lab at [facility] on 1st floor. If you have a lab appointment with the [facility] please come in thru the  Main Entrance and check in at the main information desk  You need to re-schedule your appointment should you arrive 10 or more minutes late.  We strive to give you quality time with our providers, and arriving late affects you and other patients whose appointments are after yours.  Also, if you no show three or more times for appointments you may be dismissed from the clinic at the providers discretion.     Again, thank you for choosing [facility].  Our hope is that these requests will decrease the amount of time that you wait before being seen by our physicians.       _____________________________________________________________  Should you have questions after your visit to [facility], please contact our office at [phone] between the hours of 8:30 a.m. and 4:30 p.m.  Voicemails left after 4:30 p.m. will not be returned until the following business day.  For prescription refill requests, have your pharmacy contact our office.

## 2016-01-07 NOTE — Assessment & Plan Note (Addendum)
Invasive ductal carcinoma of right breast, Stage IA (T1cN0M0), with multifocal disease in upper inner quadrant measuring 1.2 and 1.4 cm respectively, the largest lesion being grade 3 (smaller lesion in grade 2), ER/PR/HER2 POSITIVE.  S/P right lumpectomy by Dr. Brantley Stage on 11/03/2015.  Oncology history is up-to-date.  Pre-treatment labs as ordered: CBC diff, CMET.  I have recommended Ocean Spray (NS spray) to nares PRN and/or vasoline applied via Q-tip PRN to maintain moist mucous membranes.  No signs or symptoms of sinus infection.  I suspect this is secondary to dry mucous membranes from ongoing heat at home.  S/P MUGA prior to initiation of treatment demonstrating a LVEF of 71.9%.  Will repeat in April.  Order is placed and communicated to scheduling.  She has a consultation with Rad Onc, Dr. Pablo Ledger, in March 2017.  Return in 1 week for follow-up.

## 2016-01-14 ENCOUNTER — Encounter (HOSPITAL_COMMUNITY): Payer: Self-pay | Admitting: Hematology & Oncology

## 2016-01-14 ENCOUNTER — Encounter (HOSPITAL_COMMUNITY): Payer: Medicare Other | Attending: Hematology & Oncology | Admitting: Hematology & Oncology

## 2016-01-14 ENCOUNTER — Inpatient Hospital Stay (HOSPITAL_COMMUNITY): Payer: Medicare Other

## 2016-01-14 ENCOUNTER — Encounter (HOSPITAL_BASED_OUTPATIENT_CLINIC_OR_DEPARTMENT_OTHER): Payer: Medicare Other

## 2016-01-14 VITALS — BP 148/72 | HR 75 | Temp 98.3°F | Resp 16 | Wt 115.0 lb

## 2016-01-14 DIAGNOSIS — C50211 Malignant neoplasm of upper-inner quadrant of right female breast: Secondary | ICD-10-CM | POA: Diagnosis not present

## 2016-01-14 DIAGNOSIS — Z170 Estrogen receptor positive status [ER+]: Secondary | ICD-10-CM

## 2016-01-14 DIAGNOSIS — Z5111 Encounter for antineoplastic chemotherapy: Secondary | ICD-10-CM | POA: Diagnosis present

## 2016-01-14 LAB — CBC WITH DIFFERENTIAL/PLATELET
BASOS ABS: 0.1 10*3/uL (ref 0.0–0.1)
Basophils Relative: 2 %
EOS PCT: 3 %
Eosinophils Absolute: 0.2 10*3/uL (ref 0.0–0.7)
HCT: 33.1 % — ABNORMAL LOW (ref 36.0–46.0)
HEMOGLOBIN: 10.8 g/dL — AB (ref 12.0–15.0)
LYMPHS PCT: 28 %
Lymphs Abs: 1.8 10*3/uL (ref 0.7–4.0)
MCH: 30.8 pg (ref 26.0–34.0)
MCHC: 32.6 g/dL (ref 30.0–36.0)
MCV: 94.3 fL (ref 78.0–100.0)
Monocytes Absolute: 0.4 10*3/uL (ref 0.1–1.0)
Monocytes Relative: 6 %
NEUTROS PCT: 61 %
Neutro Abs: 3.8 10*3/uL (ref 1.7–7.7)
PLATELETS: 284 10*3/uL (ref 150–400)
RBC: 3.51 MIL/uL — AB (ref 3.87–5.11)
RDW: 13.6 % (ref 11.5–15.5)
WBC: 6.3 10*3/uL (ref 4.0–10.5)

## 2016-01-14 LAB — COMPREHENSIVE METABOLIC PANEL
ALK PHOS: 80 U/L (ref 38–126)
ALT: 13 U/L — AB (ref 14–54)
AST: 18 U/L (ref 15–41)
Albumin: 3.4 g/dL — ABNORMAL LOW (ref 3.5–5.0)
Anion gap: 7 (ref 5–15)
BUN: 11 mg/dL (ref 6–20)
CALCIUM: 8.7 mg/dL — AB (ref 8.9–10.3)
CO2: 25 mmol/L (ref 22–32)
CREATININE: 0.97 mg/dL (ref 0.44–1.00)
Chloride: 107 mmol/L (ref 101–111)
GFR calc Af Amer: >60 mL/min (ref >60)
GFR, EST NON AFRICAN AMERICAN: 57 mL/min — AB (ref >60)
GLUCOSE: 182 mg/dL — AB (ref 65–99)
Potassium: 3.8 mmol/L (ref 3.5–5.1)
SODIUM: 139 mmol/L (ref 135–145)
TOTAL PROTEIN: 5.9 g/dL — AB (ref 6.5–8.1)
Total Bilirubin: 0.8 mg/dL (ref 0.3–1.2)

## 2016-01-14 MED ORDER — PACLITAXEL CHEMO INJECTION 300 MG/50ML
80.0000 mg/m2 | Freq: Once | INTRAVENOUS | Status: AC
  Administered 2016-01-14: 120 mg via INTRAVENOUS
  Filled 2016-01-14: qty 20

## 2016-01-14 MED ORDER — SODIUM CHLORIDE 0.9 % IV SOLN: Freq: Once | INTRAVENOUS | Status: DC

## 2016-01-14 MED ORDER — SODIUM CHLORIDE 0.9 % IJ SOLN
10.0000 mL | INTRAMUSCULAR | Status: DC | PRN
  Administered 2016-01-14: 10 mL
  Filled 2016-01-14: qty 10

## 2016-01-14 MED ORDER — HEPARIN SOD (PORK) LOCK FLUSH 100 UNIT/ML IV SOLN
INTRAVENOUS | Status: AC
  Filled 2016-01-14: qty 5

## 2016-01-14 MED ORDER — HEPARIN SOD (PORK) LOCK FLUSH 100 UNIT/ML IV SOLN
500.0000 [IU] | Freq: Once | INTRAVENOUS | Status: AC | PRN
  Administered 2016-01-14: 500 [IU]

## 2016-01-14 MED ORDER — FAMOTIDINE IN NACL 20-0.9 MG/50ML-% IV SOLN
20.0000 mg | Freq: Once | INTRAVENOUS | Status: AC
  Administered 2016-01-14: 20 mg via INTRAVENOUS
  Filled 2016-01-14: qty 50

## 2016-01-14 MED ORDER — TRASTUZUMAB CHEMO INJECTION 440 MG
2.0000 mg/kg | Freq: Once | INTRAVENOUS | Status: AC
  Administered 2016-01-14: 105 mg via INTRAVENOUS
  Filled 2016-01-14: qty 5

## 2016-01-14 MED ORDER — ACETAMINOPHEN 325 MG PO TABS
650.0000 mg | ORAL_TABLET | Freq: Once | ORAL | Status: AC
  Administered 2016-01-14: 650 mg via ORAL
  Filled 2016-01-14: qty 2

## 2016-01-14 MED ORDER — SODIUM CHLORIDE 0.9 % IV SOLN
Freq: Once | INTRAVENOUS | Status: AC
  Administered 2016-01-14: 10:00:00 via INTRAVENOUS
  Filled 2016-01-14: qty 4

## 2016-01-14 MED ORDER — DIPHENHYDRAMINE HCL 50 MG/ML IJ SOLN
50.0000 mg | Freq: Once | INTRAMUSCULAR | Status: AC
  Administered 2016-01-14: 50 mg via INTRAVENOUS
  Filled 2016-01-14: qty 1

## 2016-01-14 MED ORDER — SODIUM CHLORIDE 0.9 % IV SOLN
Freq: Once | INTRAVENOUS | Status: AC
  Administered 2016-01-14: 10:00:00 via INTRAVENOUS

## 2016-01-14 NOTE — Patient Instructions (Addendum)
[place] at [facility] Discharge Instructions  RECOMMENDATIONS MADE BY THE CONSULTANT AND ANY TEST RESULTS WILL BE SENT TO YOUR REFERRING PHYSICIAN.   Exam and discussion by Dr Whitney Muse today Taxol/herceptin weekly Chemotherapy today Labs are stable today Return next week for chemotherapy  Muga as scheduled Return to see the doctor as scheduled in 2 weeks Please call the clinic if you have any questions or concerns    Thank you for choosing [place] at [facility] to provide your oncology and hematology care.  To afford each patient quality time with our provider, please arrive at least 15 minutes before your scheduled appointment time.   Beginning January 23rd 2017 lab work for the [facility] will be done in the  Main lab at [facility] on 1st floor. If you have a lab appointment with the [facility] please come in thru the  Main Entrance and check in at the main information desk  You need to re-schedule your appointment should you arrive 10 or more minutes late.  We strive to give you quality time with our providers, and arriving late affects you and other patients whose appointments are after yours.  Also, if you no show three or more times for appointments you may be dismissed from the clinic at the providers discretion.     Again, thank you for choosing [facility].  Our hope is that these requests will decrease the amount of time that you wait before being seen by our physicians.       _____________________________________________________________  Should you have questions after your visit to [facility], please contact our office at [phone] between the hours of 8:30 a.m. and 4:30 p.m.  Voicemails left after 4:30 p.m. will not be returned until the following business day.  For prescription refill requests, have your pharmacy contact our office.         Resources For Cancer Patients and  their Caregivers ? American Cancer Society: Can assist with transportation, wigs, general needs, runs Look Good Feel Better.        551-378-7996 ? Cancer Care: Provides financial assistance, online support groups, medication/co-pay assistance.  1-800-813-HOPE 646 680 6787) ? [place] Assists [place] cancer patients and their families through emotional , educational and financial support.  3650379614 ? [place] DSS Where to apply for food stamps, Medicaid and utility assistance. 916-468-4678 ? RCATS: Transportation to medical appointments. (279)626-8273 ? Social Security Administration: May apply for disability if have a Stage IV cancer. [phone] [phone] ? [employer], Disability and Transit Services: Assists with nutrition, care and transit needs. [phone]

## 2016-01-14 NOTE — Progress Notes (Signed)
Tolerated chemo well. Ambulatory on discharge home to self. 

## 2016-01-14 NOTE — Patient Instructions (Signed)
[facility] Discharge Instructions for Patients Receiving Chemotherapy   Beginning January 23rd 2017 lab work for the [facility] will be done in the  Main lab at [facility] on 1st floor. If you have a lab appointment with the [place] please come in thru the  Main Entrance and check in at the main information desk   Today you received the following chemotherapy agents Herceptin and Taxol.  To help prevent nausea and vomiting after your treatment, we encourage you to take your nausea medication as instructed. If you develop nausea and vomiting, or diarrhea that is not controlled by your medication, call the clinic.  The clinic phone number is [phone]. Office hours are Monday-Friday 8:30am-5:00pm.  BELOW ARE SYMPTOMS THAT SHOULD BE REPORTED IMMEDIATELY:  *FEVER GREATER THAN 101.0 F  *CHILLS WITH OR WITHOUT FEVER  NAUSEA AND VOMITING THAT IS NOT CONTROLLED WITH YOUR NAUSEA MEDICATION  *UNUSUAL SHORTNESS OF BREATH  *UNUSUAL BRUISING OR BLEEDING  TENDERNESS IN MOUTH AND THROAT WITH OR WITHOUT PRESENCE OF ULCERS  *URINARY PROBLEMS  *BOWEL PROBLEMS  UNUSUAL RASH Items with * indicate a potential emergency and should be followed up as soon as possible. If you have an emergency after office hours please contact your primary care physician or go to the nearest emergency department.  Please call the clinic during office hours if you have any questions or concerns.   You may also contact the Patient Navigator at [phone] should you have any questions or need assistance in obtaining follow up care.  Return as scheduled.   Resources For Cancer Patients and their Caregivers ? American Cancer Society: Can assist with transportation, wigs, general needs, runs Look Good Feel Better.        813-412-3148 ? Cancer Care: Provides financial assistance, online support groups, medication/co-pay assistance.  1-800-813-HOPE (302) 349-4787) ? [place] Assists [place] cancer patients and their families through emotional , educational and financial support.  313-123-2238 ? [place] DSS Where to apply for food stamps, Medicaid and utility assistance. (913)197-8196 ? RCATS: Transportation to medical appointments. [phone] [phone] ? Social Security Administration: May apply for disability if have a Stage IV cancer. [phone] ? [employer], Disability and Transit Services: Assists with nutrition, care and transit needs. [phone]

## 2016-01-14 NOTE — Progress Notes (Signed)
[place] at [facility] Progress Note  Patient Care Team: Lajean Manes, MD as PCP - General  CHIEF COMPLAINTS:  Invasive ductal carcinoma triple positive  ER+, PR+, HER 2 neu +  RIGHT breast cancer, upper inner quadrant Screening mammogram on 09/04/2015 with possible R breast mass 2 densities noted in the R breast upper inner quadrant within a centimeter of each other (9 and 10 cm from the nipple) 11/03/2015 partial mastectomy, sentinel node biopsy and port placement with Dr. Brantley Stage Final pathology [id number] ER+PR+ Her 2 neu+ R breast carcinoma  HISTORY OF PRESENTING ILLNESS:  Lori Greene 75 y.o. female is here because of abnormal mammogram of the R breast and newly diagnosed R breast cancer, disease is HER 2 neu positive.  Lori Greene is here alone and is resting in bed receiving treatment. She is here for cycle #8 herceptin and paclitaxel chemotherapy.   She states that she has been doing okay and that she is just tired.   She says that her appetite is okay and that her taste does not seem to be off. She notes that she eats well. She denies nausea.  She denies any problems with her bowels or fingernails. No diarrhea or constipation.  She states that her fingers and toes are okay. No symptoms of neuropathy.  She does not need any refills at this time.   MEDICAL HISTORY:  Past Medical History  Diagnosis Date  . Hypertension   . Diabetes mellitus without complication ([place])   . Asthma   . GERD (gastroesophageal reflux disease)   . Cancer [facility]) 2016    right breast    SURGICAL HISTORY: Past Surgical History  Procedure Laterality Date  . Abdominal hysterectomy  1983  . Nasal fracture surgery    . Breast lumpectomy with radioactive seed and sentinel lymph node biopsy Right 11/03/2015    Procedure: RIGHT BREAST LUMPECTOMY WITH RADIOACTIVE SEED AND SENTINEL LYMPH NODE MAPPING;  Surgeon: Erroll Luna, MD;  Location: [place];  Service:  General;  Laterality: Right;  . Portacath placement Right 11/03/2015    Procedure: INSERTION PORT-A-CATH;  Surgeon: Erroll Luna, MD;  Location: [place];  Service: General;  Laterality: Right;    SOCIAL HISTORY: Social History   Social History  . Marital Status: Married    Spouse Name: N/A  . Number of Children: N/A  . Years of Education: N/A   Occupational History  . retired    Social History Main Topics  . Smoking status: Never Smoker   . Smokeless tobacco: Never Used  . Alcohol Use: No  . Drug Use: No  . Sexual Activity: No   Other Topics Concern  . Not on file   Social History Narrative  Married 51 years. 0 children. Fostered children. 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 She likes to sew, knit, and quilt. She likes to walk but does not do it often. Non smoker ETOH, none She is from [place] and moved to [place] when she got married.  FAMILY HISTORY: Family History  Problem Relation Age of Onset  . Breast cancer Mother   . Leukemia Father    has no family status information on file.  Father died at 92 yo of leukemia Mother died of old age; breast cancer diagnosis at 74 yo. She had a biopsy, no treatment. 1 sister, healthy. No breast cancer.  ALLERGIES:  is allergic to losartan and penicillins.  MEDICATIONS:  Current  Outpatient Prescriptions  Medication Sig Dispense Refill  . albuterol (PROAIR HFA) 108 (90 BASE) MCG/ACT inhaler Inhale 2 puffs into the lungs every 6 (six) hours as needed for wheezing or shortness of breath.    Marland Kitchen atorvastatin (LIPITOR) 10 MG tablet Take 10 mg by mouth daily.    . budesonide-formoterol (SYMBICORT) 80-4.5 MCG/ACT inhaler Inhale 2 puffs into the lungs 2 (two) times daily.    . Calcium Carbonate-Vitamin D (CALCIUM + D PO) Take 1 tablet by mouth every morning.    . Cholecalciferol (VITAMIN D3) 2000 units TABS Take by mouth.    . dextromethorphan-guaiFENesin (MUCINEX DM)  30-600 MG 12hr tablet Take 1 tablet by mouth 2 (two) times daily. Reported on 12/31/2015    . famotidine (PEPCID) 20 MG tablet Take 20 mg by mouth 2 (two) times daily.    Marland Kitchen lidocaine-prilocaine (EMLA) cream Apply a quarter size amount to port site 1 hour prior to chemo. Do not rub in. Cover with plastic wrap. 30 g 3  . lisinopril (PRINIVIL,ZESTRIL) 5 MG tablet Take 5 mg by mouth daily.    . metFORMIN (GLUCOPHAGE) 500 MG tablet Take 500 mg by mouth daily with breakfast.     . montelukast (SINGULAIR) 10 MG tablet     . omeprazole (PRILOSEC) 40 MG capsule Take 40 mg by mouth daily.    . ondansetron (ZOFRAN) 8 MG tablet Take 1 tablet (8 mg total) by mouth every 8 (eight) hours as needed for nausea or vomiting. 30 tablet 2  . PACLitaxel (TAXOL) 300 MG/50ML injection Inject into the vein. To start weekly Taxol x 12 weeks on January 12.    . prochlorperazine (COMPAZINE) 10 MG tablet Take 1 tablet (10 mg total) by mouth every 6 (six) hours as needed for nausea or vomiting. 30 tablet 2  . Trastuzumab (HERCEPTIN IV) Inject into the vein. To start on November 26, 2015.     No current facility-administered medications for this visit.   Facility-Administered Medications Ordered in Other Visits  Medication Dose Route Frequency Provider Last Rate Last Dose  . 0.9 %  sodium chloride infusion   Intravenous Once Patrici Ranks, MD      . sodium chloride 0.9 % injection 10 mL  10 mL Intracatheter PRN Patrici Ranks, MD   10 mL at 01/14/16 1002    Review of Systems  Constitutional: Positive for malaise/fatigue. Negative for fever and chills.  HENT: Negative.  Negative for congestion, hearing loss, nosebleeds, sore throat and tinnitus.   Eyes: Negative.  Negative for blurred vision, double vision, pain and discharge.  Respiratory: Negative.  Negative for cough, hemoptysis, sputum production, shortness of breath and wheezing.   Cardiovascular: Negative.  Negative for chest pain, palpitations, claudication,  leg swelling and PND.  Gastrointestinal: Negative for heartburn, nausea, abdominal pain, diarrhea, constipation, blood in stool and melena.  Genitourinary: Negative.  Negative for dysuria, urgency, frequency and hematuria.  Musculoskeletal: Negative.  Negative for myalgias, joint pain and falls.  Skin: Negative.  Negative for itching and rash.  Neurological: Positive for tingling. Negative for dizziness, tremors, sensory change, speech change, focal weakness, seizures, loss of consciousness, weakness and headaches.       Tingling in toes, worse at night.  Endo/Heme/Allergies: Negative.  Does not bruise/bleed easily.  Psychiatric/Behavioral: Negative for depression, suicidal ideas, memory loss and substance abuse. The patient is not nervous/anxious.   All other systems reviewed and are negative.  14 point ROS was done and is otherwise as detailed above  or in HPI  PHYSICAL EXAMINATION: ECOG PERFORMANCE STATUS: 0 - Asymptomatic  Vitals with BMI 01/14/2016  Height   Weight   BMI   Systolic 123456  Diastolic 72  Pulse 75  Respirations 16    Physical Exam  Constitutional: She is oriented to person, place, and time and well-developed, well-nourished, and in no distress.  Wears glasses.  HENT:  Head: Normocephalic and atraumatic.  Nose: Nose normal.  Mouth/Throat: Oropharynx is clear and moist. No oropharyngeal exudate.  Eyes: Conjunctivae and EOM are normal. Pupils are equal, round, and reactive to light. Right eye exhibits no discharge. Left eye exhibits no discharge. No scleral icterus.  Neck: Normal range of motion. Neck supple. No tracheal deviation present. No thyromegaly present.  Cardiovascular: Normal rate, regular rhythm and normal heart sounds.  Exam reveals no gallop and no friction rub.   No murmur heard. Pulmonary/Chest: Effort normal and breath sounds normal. She has no wheezes. She has no rales.  Abdominal: Soft. Bowel sounds are normal. She exhibits no distension and no mass.  There is no tenderness. There is no rebound and no guarding.  Musculoskeletal: Normal range of motion. She exhibits no edema.  Lymphadenopathy:    She has no cervical adenopathy.  Neurological: She is alert and oriented to person, place, and time. She has normal reflexes. No cranial nerve deficit. Gait normal. Coordination normal.  Skin: Skin is warm and dry. No rash noted.  Psychiatric: Mood, memory, affect and judgment normal.  Nursing note and vitals reviewed.   LABORATORY DATA:  I have reviewed the data as listed Lab Results  Component Value Date   WBC 6.3 01/14/2016   HGB 10.8* 01/14/2016   HCT 33.1* 01/14/2016   MCV 94.3 01/14/2016   PLT 284 01/14/2016   CMP     Component Value Date/Time   NA 139 01/14/2016 0901   K 3.8 01/14/2016 0901   CL 107 01/14/2016 0901   CO2 25 01/14/2016 0901   GLUCOSE 182* 01/14/2016 0901   BUN 11 01/14/2016 0901   CREATININE 0.97 01/14/2016 0901   CALCIUM 8.7* 01/14/2016 0901   PROT 5.9* 01/14/2016 0901   ALBUMIN 3.4* 01/14/2016 0901   AST 18 01/14/2016 0901   ALT 13* 01/14/2016 0901   ALKPHOS 80 01/14/2016 0901   BILITOT 0.8 01/14/2016 0901   GFRNONAA 57* 01/14/2016 0901   GFRAA >60 01/14/2016 0901     RADIOGRAPHIC STUDIES: I have personally reviewed the radiological images as listed and agreed with the findings in the report. Study Result     CLINICAL DATA: Breast cancer. Patient may receive cardiotoxic chemotherapy.  EXAM: NUCLEAR MEDICINE CARDIAC BLOOD POOL IMAGING (MUGA)  TECHNIQUE: Cardiac multi-gated acquisition was performed at rest following intravenous injection of Tc-68m labeled red blood cells.  RADIOPHARMACEUTICALS: 25.5 mCi Tc-17m MDP in-vitro labeled red blood cells IV  COMPARISON: None  FINDINGS: The left ventricular ejection fraction is calculated equal 71.9%. Gated images show normal left ventricular wall motion.  IMPRESSION: 1. Normal left ventricular ejection fraction equal to  71.9%.   Electronically Signed  By: Kerby Moors M.D.  On: 11/21/2015 13:00    PATHOLOGY:   ASSESSMENT & PLAN:  Invasive ductal carcinoma triple positive  ER+ (100%), PR+ (10%), HER 2 neu +  RIGHT breast cancer, upper inner quadrant Screening mammogram on 09/04/2015 with possible R breast mass 2 densities noted in the R breast upper inner quadrant within a centimeter of each other (9 and 10 cm from the nipple) 11/03/2015 partial mastectomy, sentinel  node biopsy and port placement with Dr. Brantley Stage Final pathology [id number] ER+PR+ Her 2 neu+ R breast carcinoma  She is doing well. The patient is here for cycle #8 herceptin and paclitaxel today.  She has Radiation consultation scheduled on the March 22nd with Dr. Pablo Ledger.   We discussed having her follow up with survivorship in the future. We will discuss this further at future visits.   She will have MUGA scheduled for 02/15/2016.  She will come back for a follow up in 2 weeks. Goal is to complete 12 weekly cycles of taxol/herceptin followed by completing one year of herceptin therapy.  All questions were answered. The patient knows to call the clinic with any problems, questions or concerns.  This note was electronically signed.   This document serves as a record of services personally performed by Ancil Linsey, MD. It was created on her behalf by Kandace Blitz, a trained medical scribe. The creation of this record is based on the scribe's personal observations and the provider's statements to them. This document has been checked and approved by the attending provider.  I have reviewed the above documentation for accuracy and completeness, and I agree with the above.   Molli Hazard, MD  01/14/2016 2:49 PM

## 2016-01-21 ENCOUNTER — Encounter (HOSPITAL_BASED_OUTPATIENT_CLINIC_OR_DEPARTMENT_OTHER): Payer: Medicare Other

## 2016-01-21 ENCOUNTER — Encounter (HOSPITAL_COMMUNITY): Payer: Self-pay

## 2016-01-21 ENCOUNTER — Inpatient Hospital Stay (HOSPITAL_COMMUNITY): Payer: Medicare Other

## 2016-01-21 VITALS — BP 151/76 | HR 77 | Temp 98.1°F | Resp 16 | Wt 116.0 lb

## 2016-01-21 DIAGNOSIS — Z5112 Encounter for antineoplastic immunotherapy: Secondary | ICD-10-CM

## 2016-01-21 DIAGNOSIS — Z5111 Encounter for antineoplastic chemotherapy: Secondary | ICD-10-CM | POA: Diagnosis not present

## 2016-01-21 DIAGNOSIS — C50211 Malignant neoplasm of upper-inner quadrant of right female breast: Secondary | ICD-10-CM

## 2016-01-21 LAB — CBC WITH DIFFERENTIAL/PLATELET
Basophils Absolute: 0.1 10*3/uL (ref 0.0–0.1)
Basophils Relative: 2 %
EOS ABS: 0.1 10*3/uL (ref 0.0–0.7)
Eosinophils Relative: 3 %
HCT: 32.1 % — ABNORMAL LOW (ref 36.0–46.0)
HEMOGLOBIN: 10.6 g/dL — AB (ref 12.0–15.0)
LYMPHS ABS: 1.8 10*3/uL (ref 0.7–4.0)
Lymphocytes Relative: 33 %
MCH: 31 pg (ref 26.0–34.0)
MCHC: 33 g/dL (ref 30.0–36.0)
MCV: 93.9 fL (ref 78.0–100.0)
MONOS PCT: 8 %
Monocytes Absolute: 0.4 10*3/uL (ref 0.1–1.0)
Neutro Abs: 3 10*3/uL (ref 1.7–7.7)
Neutrophils Relative %: 55 %
Platelets: 300 10*3/uL (ref 150–400)
RBC: 3.42 MIL/uL — ABNORMAL LOW (ref 3.87–5.11)
RDW: 13.8 % (ref 11.5–15.5)
WBC: 5.4 10*3/uL (ref 4.0–10.5)

## 2016-01-21 LAB — COMPREHENSIVE METABOLIC PANEL
ALK PHOS: 86 U/L (ref 38–126)
ALT: 12 U/L — ABNORMAL LOW (ref 14–54)
ANION GAP: 6 (ref 5–15)
AST: 18 U/L (ref 15–41)
Albumin: 3.3 g/dL — ABNORMAL LOW (ref 3.5–5.0)
BUN: 9 mg/dL (ref 6–20)
CALCIUM: 8.3 mg/dL — AB (ref 8.9–10.3)
CO2: 24 mmol/L (ref 22–32)
Chloride: 107 mmol/L (ref 101–111)
Creatinine, Ser: 0.97 mg/dL (ref 0.44–1.00)
GFR, EST NON AFRICAN AMERICAN: 57 mL/min — AB (ref >60)
Glucose, Bld: 180 mg/dL — ABNORMAL HIGH (ref 65–99)
Potassium: 3.7 mmol/L (ref 3.5–5.1)
SODIUM: 137 mmol/L (ref 135–145)
Total Bilirubin: 0.8 mg/dL (ref 0.3–1.2)
Total Protein: 5.8 g/dL — ABNORMAL LOW (ref 6.5–8.1)

## 2016-01-21 MED ORDER — ACETAMINOPHEN 325 MG PO TABS
650.0000 mg | ORAL_TABLET | Freq: Once | ORAL | Status: AC
  Administered 2016-01-21: 650 mg via ORAL
  Filled 2016-01-21: qty 2

## 2016-01-21 MED ORDER — DIPHENHYDRAMINE HCL 50 MG/ML IJ SOLN
50.0000 mg | Freq: Once | INTRAMUSCULAR | Status: AC
  Administered 2016-01-21: 50 mg via INTRAVENOUS
  Filled 2016-01-21: qty 1

## 2016-01-21 MED ORDER — DEXTROSE 5 % IV SOLN
80.0000 mg/m2 | Freq: Once | INTRAVENOUS | Status: AC
  Administered 2016-01-21: 120 mg via INTRAVENOUS
  Filled 2016-01-21: qty 20

## 2016-01-21 MED ORDER — HEPARIN SOD (PORK) LOCK FLUSH 100 UNIT/ML IV SOLN
500.0000 [IU] | Freq: Once | INTRAVENOUS | Status: AC | PRN
  Administered 2016-01-21: 500 [IU]
  Filled 2016-01-21: qty 5

## 2016-01-21 MED ORDER — FAMOTIDINE IN NACL 20-0.9 MG/50ML-% IV SOLN
20.0000 mg | Freq: Once | INTRAVENOUS | Status: AC
  Administered 2016-01-21: 20 mg via INTRAVENOUS
  Filled 2016-01-21: qty 50

## 2016-01-21 MED ORDER — SODIUM CHLORIDE 0.9 % IV SOLN
Freq: Once | INTRAVENOUS | Status: AC
  Administered 2016-01-21: 10:00:00 via INTRAVENOUS

## 2016-01-21 MED ORDER — SODIUM CHLORIDE 0.9 % IJ SOLN
10.0000 mL | INTRAMUSCULAR | Status: DC | PRN
  Administered 2016-01-21: 10 mL
  Filled 2016-01-21: qty 10

## 2016-01-21 MED ORDER — SODIUM CHLORIDE 0.9 % IV SOLN
Freq: Once | INTRAVENOUS | Status: AC
  Administered 2016-01-21: 10:00:00 via INTRAVENOUS
  Filled 2016-01-21: qty 4

## 2016-01-21 MED ORDER — TRASTUZUMAB CHEMO INJECTION 440 MG
2.0000 mg/kg | Freq: Once | INTRAVENOUS | Status: AC
  Administered 2016-01-21: 105 mg via INTRAVENOUS
  Filled 2016-01-21: qty 5

## 2016-01-21 NOTE — Progress Notes (Signed)
1235:  Tolerated tx w/o adverse reaction.  VSS.  A&Ox4, in no distress.  Discharged ambulatory.

## 2016-01-21 NOTE — Patient Instructions (Signed)
[facility] Discharge Instructions for Patients Receiving Chemotherapy   Beginning January 23rd 2017 lab work for the [facility] will be done in the  Main lab at [facility] on 1st floor. If you have a lab appointment with the [facility] please come in thru the  Main Entrance and check in at the main information desk   Today you received the following chemotherapy agents:  Taxol and Herceptin  To help prevent nausea and vomiting after your treatment, we encourage you to take your nausea medication as prescribed.  If you develop nausea and vomiting, or diarrhea that is not controlled by your medication, call the clinic.  The clinic phone number is [phone]. Office hours are Monday-Friday 8:30am-5:00pm.  BELOW ARE SYMPTOMS THAT SHOULD BE REPORTED IMMEDIATELY:  *FEVER GREATER THAN 101.0 F  *CHILLS WITH OR WITHOUT FEVER  NAUSEA AND VOMITING THAT IS NOT CONTROLLED WITH YOUR NAUSEA MEDICATION  *UNUSUAL SHORTNESS OF BREATH  *UNUSUAL BRUISING OR BLEEDING  TENDERNESS IN MOUTH AND THROAT WITH OR WITHOUT PRESENCE OF ULCERS  *URINARY PROBLEMS  *BOWEL PROBLEMS  UNUSUAL RASH Items with * indicate a potential emergency and should be followed up as soon as possible. If you have an emergency after office hours please contact your primary care physician or go to the nearest emergency department.  Please call the clinic during office hours if you have any questions or concerns.   You may also contact the Patient Navigator at [phone] should you have any questions or need assistance in obtaining follow up care.      Resources For Cancer Patients and their Caregivers ? American Cancer Society: Can assist with transportation, wigs, general needs, runs Look Good Feel Better.        [phone] ? Cancer Care: Provides financial assistance, online support groups, medication/co-pay assistance.  1-800-813-HOPE 954-826-0615) ? [facility] Assists [place] cancer patients and their families through emotional , educational and financial support.  [phone] ? [place] DSS Where to apply for food stamps, Medicaid and utility assistance. 604-152-0641 ? RCATS: Transportation to medical appointments. 217-046-9611 ? Social Security Administration: May apply for disability if have a Stage IV cancer. [phone] ? [employer], Disability and Transit Services: Assists with nutrition, care and transit needs. [phone]

## 2016-01-26 ENCOUNTER — Ambulatory Visit: Payer: Medicare Other | Admitting: Internal Medicine

## 2016-01-28 ENCOUNTER — Encounter (HOSPITAL_BASED_OUTPATIENT_CLINIC_OR_DEPARTMENT_OTHER): Payer: Medicare Other

## 2016-01-28 ENCOUNTER — Encounter (HOSPITAL_COMMUNITY): Payer: Self-pay

## 2016-01-28 ENCOUNTER — Inpatient Hospital Stay (HOSPITAL_COMMUNITY): Payer: Medicare Other

## 2016-01-28 ENCOUNTER — Encounter (HOSPITAL_BASED_OUTPATIENT_CLINIC_OR_DEPARTMENT_OTHER): Payer: Medicare Other | Admitting: Hematology & Oncology

## 2016-01-28 VITALS — BP 185/78 | HR 88 | Temp 98.1°F | Resp 18 | Wt 115.9 lb

## 2016-01-28 DIAGNOSIS — T451X5A Adverse effect of antineoplastic and immunosuppressive drugs, initial encounter: Secondary | ICD-10-CM

## 2016-01-28 DIAGNOSIS — Z170 Estrogen receptor positive status [ER+]: Secondary | ICD-10-CM | POA: Diagnosis not present

## 2016-01-28 DIAGNOSIS — Z5111 Encounter for antineoplastic chemotherapy: Secondary | ICD-10-CM | POA: Diagnosis not present

## 2016-01-28 DIAGNOSIS — Z5112 Encounter for antineoplastic immunotherapy: Secondary | ICD-10-CM | POA: Diagnosis present

## 2016-01-28 DIAGNOSIS — C50211 Malignant neoplasm of upper-inner quadrant of right female breast: Secondary | ICD-10-CM | POA: Diagnosis not present

## 2016-01-28 DIAGNOSIS — G620 Drug-induced polyneuropathy: Secondary | ICD-10-CM

## 2016-01-28 LAB — CBC WITH DIFFERENTIAL/PLATELET
BASOS PCT: 1 %
Basophils Absolute: 0.1 10*3/uL (ref 0.0–0.1)
EOS ABS: 0.2 10*3/uL (ref 0.0–0.7)
Eosinophils Relative: 3 %
HCT: 30.7 % — ABNORMAL LOW (ref 36.0–46.0)
HEMOGLOBIN: 10.2 g/dL — AB (ref 12.0–15.0)
LYMPHS ABS: 1.9 10*3/uL (ref 0.7–4.0)
LYMPHS PCT: 34 %
MCH: 31.4 pg (ref 26.0–34.0)
MCHC: 33.2 g/dL (ref 30.0–36.0)
MCV: 94.5 fL (ref 78.0–100.0)
Monocytes Absolute: 0.4 10*3/uL (ref 0.1–1.0)
Monocytes Relative: 7 %
NEUTROS ABS: 3 10*3/uL (ref 1.7–7.7)
NEUTROS PCT: 55 %
Platelets: 302 10*3/uL (ref 150–400)
RBC: 3.25 MIL/uL — ABNORMAL LOW (ref 3.87–5.11)
RDW: 14.1 % (ref 11.5–15.5)
WBC: 5.5 10*3/uL (ref 4.0–10.5)

## 2016-01-28 LAB — COMPREHENSIVE METABOLIC PANEL
ALBUMIN: 3.3 g/dL — AB (ref 3.5–5.0)
ALT: 13 U/L — ABNORMAL LOW (ref 14–54)
ANION GAP: 7 (ref 5–15)
AST: 19 U/L (ref 15–41)
Alkaline Phosphatase: 83 U/L (ref 38–126)
BUN: 8 mg/dL (ref 6–20)
CO2: 25 mmol/L (ref 22–32)
Calcium: 8.6 mg/dL — ABNORMAL LOW (ref 8.9–10.3)
Chloride: 107 mmol/L (ref 101–111)
Creatinine, Ser: 0.9 mg/dL (ref 0.44–1.00)
GFR calc Af Amer: >60 mL/min (ref >60)
GFR calc non Af Amer: >60 mL/min (ref >60)
GLUCOSE: 196 mg/dL — AB (ref 65–99)
POTASSIUM: 3.6 mmol/L (ref 3.5–5.1)
SODIUM: 139 mmol/L (ref 135–145)
Total Bilirubin: 0.7 mg/dL (ref 0.3–1.2)
Total Protein: 5.8 g/dL — ABNORMAL LOW (ref 6.5–8.1)

## 2016-01-28 MED ORDER — HEPARIN SOD (PORK) LOCK FLUSH 100 UNIT/ML IV SOLN
500.0000 [IU] | Freq: Once | INTRAVENOUS | Status: AC | PRN
  Administered 2016-01-28: 500 [IU]
  Filled 2016-01-28: qty 5

## 2016-01-28 MED ORDER — SODIUM CHLORIDE 0.9 % IJ SOLN: 10.0000 mL | INTRAMUSCULAR | Status: DC | PRN

## 2016-01-28 MED ORDER — FAMOTIDINE IN NACL 20-0.9 MG/50ML-% IV SOLN
INTRAVENOUS | Status: AC
  Filled 2016-01-28: qty 50

## 2016-01-28 MED ORDER — PACLITAXEL CHEMO INJECTION 300 MG/50ML
80.0000 mg/m2 | Freq: Once | INTRAVENOUS | Status: AC
  Administered 2016-01-28: 120 mg via INTRAVENOUS
  Filled 2016-01-28: qty 20

## 2016-01-28 MED ORDER — FAMOTIDINE IN NACL 20-0.9 MG/50ML-% IV SOLN
20.0000 mg | Freq: Once | INTRAVENOUS | Status: AC
  Administered 2016-01-28: 20 mg via INTRAVENOUS

## 2016-01-28 MED ORDER — DIPHENHYDRAMINE HCL 50 MG/ML IJ SOLN
INTRAMUSCULAR | Status: AC
  Filled 2016-01-28: qty 1

## 2016-01-28 MED ORDER — SODIUM CHLORIDE 0.9 % IV SOLN
Freq: Once | INTRAVENOUS | Status: AC
  Administered 2016-01-28: 10:00:00 via INTRAVENOUS

## 2016-01-28 MED ORDER — ACETAMINOPHEN 325 MG PO TABS
ORAL_TABLET | ORAL | Status: AC
  Filled 2016-01-28: qty 2

## 2016-01-28 MED ORDER — SODIUM CHLORIDE 0.9 % IV SOLN
Freq: Once | INTRAVENOUS | Status: AC
  Administered 2016-01-28: 11:00:00 via INTRAVENOUS
  Filled 2016-01-28: qty 4

## 2016-01-28 MED ORDER — TRASTUZUMAB CHEMO INJECTION 440 MG
2.0000 mg/kg | Freq: Once | INTRAVENOUS | Status: AC
  Administered 2016-01-28: 105 mg via INTRAVENOUS
  Filled 2016-01-28: qty 5

## 2016-01-28 MED ORDER — ACETAMINOPHEN 325 MG PO TABS
650.0000 mg | ORAL_TABLET | Freq: Once | ORAL | Status: AC
  Administered 2016-01-28: 650 mg via ORAL

## 2016-01-28 MED ORDER — DIPHENHYDRAMINE HCL 50 MG/ML IJ SOLN
50.0000 mg | Freq: Once | INTRAMUSCULAR | Status: AC
Start: 2016-01-28 — End: 2016-01-28
  Administered 2016-01-28: 50 mg via INTRAVENOUS

## 2016-01-28 MED ORDER — METFORMIN HCL 500 MG PO TABS: 500.0000 mg | ORAL_TABLET | Freq: Every day | ORAL | Status: DC

## 2016-01-28 NOTE — Patient Instructions (Addendum)
[place] at [facility] Discharge Instructions  RECOMMENDATIONS MADE BY THE CONSULTANT AND ANY TEST RESULTS WILL BE SENT TO YOUR REFERRING PHYSICIAN.   Exam and discussion by Dr Whitney Muse today Herceptin every week as scheduled  Return to see the doctor as scheduled  Please call the clinic if you have any questions or concerns    Thank you for choosing [place] at [facility] to provide your oncology and hematology care.  To afford each patient quality time with our provider, please arrive at least 15 minutes before your scheduled appointment time.   Beginning January 23rd 2017 lab work for the [facility] will be done in the  Main lab at [facility] on 1st floor. If you have a lab appointment with the [facility] please come in thru the  Main Entrance and check in at the main information desk  You need to re-schedule your appointment should you arrive 10 or more minutes late.  We strive to give you quality time with our providers, and arriving late affects you and other patients whose appointments are after yours.  Also, if you no show three or more times for appointments you may be dismissed from the clinic at the providers discretion.     Again, thank you for choosing [facility].  Our hope is that these requests will decrease the amount of time that you wait before being seen by our physicians.       _____________________________________________________________  Should you have questions after your visit to [facility], please contact our office at [phone] between the hours of 8:30 a.m. and 4:30 p.m.  Voicemails left after 4:30 p.m. will not be returned until the following business day.  For prescription refill requests, have your pharmacy contact our office.         Resources For Cancer Patients and their Caregivers ? American Cancer Society: Can assist with transportation, wigs, general needs,  runs Look Good Feel Better.        832-573-8682 ? Cancer Care: Provides financial assistance, online support groups, medication/co-pay assistance.  1-800-813-HOPE (224)570-7321) ? [place] Assists [place] cancer patients and their families through emotional , educational and financial support.  765-404-8946 ? [place] DSS Where to apply for food stamps, Medicaid and utility assistance. (563)401-1609 ? [employer]: Transportation to medical appointments. 202-551-1702 ? Social Security Administration: May apply for disability if have a Stage IV cancer. 712-311-7486 [phone])097-3785 ? [employer], Disability and Transit Services: Assists with nutrition, care and transit needs. [phone]

## 2016-01-28 NOTE — Progress Notes (Signed)
Tolerated well

## 2016-01-28 NOTE — Patient Instructions (Signed)
..  [place] at [facility] Discharge Instructions  RECOMMENDATIONS MADE BY THE CONSULTANT AND ANY TEST RESULTS WILL BE SENT TO YOUR REFERRING PHYSICIAN.  Taxol and herceptin  Thank you for choosing [place] at [facility] to provide your oncology and hematology care.  To afford each patient quality time with our provider, please arrive at least 15 minutes before your scheduled appointment time.   Beginning January 23rd 2017 lab work for the [facility] will be done in the  Main lab at [facility] on 1st floor. If you have a lab appointment with the [facility] please come in thru the  Main Entrance and check in at the main information desk  You need to re-schedule your appointment should you arrive 10 or more minutes late.  We strive to give you quality time with our providers, and arriving late affects you and other patients whose appointments are after yours.  Also, if you no show three or more times for appointments you may be dismissed from the clinic at the providers discretion.     Again, thank you for choosing [facility].  Our hope is that these requests will decrease the amount of time that you wait before being seen by our physicians.       _____________________________________________________________  Should you have questions after your visit to [facility], please contact our office at [phone] between the hours of 8:30 a.m. and 4:30 p.m.  Voicemails left after 4:30 p.m. will not be returned until the following business day.  For prescription refill requests, have your pharmacy contact our office.         Resources For Cancer Patients and their Caregivers ? American Cancer Society: Can assist with transportation, wigs, general needs, runs Look Good Feel Better.        9737066443 ? Cancer Care: Provides financial assistance, online support groups, medication/co-pay assistance.   1-800-813-HOPE (984) 105-8541) ? [place] [facility] [place] Assists [place] cancer patients and their families through emotional , educational and financial support.  651-399-6667 ? [place] DSS Where to apply for food stamps, Medicaid and utility assistance. 260-578-5157 ? [employer]: Transportation to medical appointments. 430-734-1190 ? Social Security Administration: May apply for disability if have a Stage IV cancer. 684-880-0186 586-258-1461 ? [employer], Disability and Transit Services: Assists with nutrition, care and transit needs. [phone]

## 2016-01-28 NOTE — Progress Notes (Signed)
[place] at [facility] Progress Note  Patient Care Team: Lajean Manes, MD as PCP - General  CHIEF COMPLAINTS:  Invasive ductal carcinoma triple positive  ER+, PR+, HER 2 neu +  RIGHT breast cancer, upper inner quadrant Screening mammogram on 09/04/2015 with possible R breast mass 2 densities noted in the R breast upper inner quadrant within a centimeter of each other (9 and 10 cm from the nipple) 11/03/2015 partial mastectomy, sentinel node biopsy and port placement with Dr. Brantley Stage Final pathology [id number] ER+PR+ Her 2 neu+ R breast carcinoma    Breast cancer of upper-inner quadrant of right female breast ([place])   09/01/2015 Mammogram BI-RADS CATEGORY 0: Incomplete. Need additional imaging evaluation and/or prior mammograms for comparison.   09/15/2015 Imaging CT chest- Right lower lobe 4 mm solid pulmonary nodule. If the patient is at high risk for bronchogenic carcinoma, follow-up chest CT at 1 year is recommended.   09/16/2015 Breast US US showing a small hypoechoic irregular mass with hyperechoic rim in the 1 o'clock location of R breast 10 cm from the nipple. Mass measures 0.7 x 0.5 x 0.6 cm. 2nd mass is identified in the 1 o'clock location 9 cm from the nipple 0.7 x 0.7 x 0.6 cm   09/16/2015 Mammogram Two suspicious masses in the 1 o'clock location of the right breast warranting tissue diagnosis.  No evidence for adenopathy.   09/21/2015 Pathology Results Estrogen Receptor: 100%, POSITIVE, STRONG STAINING INTENSITY Progesterone Receptor: 10%, POSITIVE, MODERATE STAINING INTENSITY Proliferation Marker Ki67: 10%.  HER2 - **POSITIVE**   09/21/2015 Mammogram Appropriate positioning of the 2 biopsy marking clips in the upper-inner quadrant of the right breast at 1 o'clock.   09/21/2015 Initial Biopsy Breast, right, needle core biopsy, 1:00 o'clock, 9 CMFN - INVASIVE DUCTAL CARCINOMA. - DUCTAL CARCINOMA IN SITU. - SEE COMMENT. 2. Breast, right, needle core biopsy, 1:00 o'clock,  10 CMFN - INVASIVE DUCTAL CARCINOMA. - DUCTAL CARCINOMA IN SITU WITH    10/26/2015 Procedure Right chest porta cath placed with no adverse features.- Dr. Brantley Stage.   11/03/2015 Pathology Results MULTIFOCAL INVASIVE DUCTAL CARCINOMA.  INVASIVE TUMOR IS 1.0 MM FROM NEAREST MARGIN (INFERIOR).  HIGH GRADE DUCTAL CARCINOMA IN SITU WITH NECROSIS.  IN SITU CARCINOMA IS 0.1 MM FROM THE NEAREST MARGIN (INFERIOR MARGIN).   11/03/2015 Procedure Right lumpectomy by Dr. Brantley Stage.   11/17/2015 Initial Diagnosis Breast cancer of upper-inner quadrant of right female breast ([place])   11/21/2015 Imaging Normal left ventricular ejection fraction equal to 71.9%.   11/26/2015 -  Chemotherapy Paclitaxel weekly x 12    11/26/2015 -  Antibody Plan Herceptin weekly with Paclitaxel.     HISTORY OF PRESENTING ILLNESS:  Lori Greene 75 y.o. female is here for follow-up of ER+ HER 2 neu+ carcinoma of the R breast.   Mrs. Palau was as here alone today. She was in a treatment bed receiving cycle #10 of weekly herceptin/taxol chemotherapy today.  She states that her breathing is good and has had no SOB. Her bowels are okay. She also said that her appetite is okay but she is sill not tasting anything.   Her fingers and toes are good but she said that she has been intermittently dropping things. She can still feel her fingers and toes, can write her name, can feel the floor and the stairs, and can still drive. She states that she has not fallen.   She asked if her recent cough was related to the cancer. She said that  it was really bad before she started chemo but that she hasn't had it since January.   She had an appointment with Dr. Chase Caller on Tuesday but forgot to go to it because she had "chemo brain"  She has a radiation appointment next Wednesday with Dr. Pablo Ledger.  She needs a prescription refill of Metformin.   MEDICAL HISTORY:  Past Medical History  Diagnosis Date  . Hypertension   . Diabetes mellitus without  complication ([place])   . Asthma   . GERD (gastroesophageal reflux disease)   . Cancer [facility]) 2016    right breast    SURGICAL HISTORY: Past Surgical History  Procedure Laterality Date  . Abdominal hysterectomy  1983  . Nasal fracture surgery    . Breast lumpectomy with radioactive seed and sentinel lymph node biopsy Right 11/03/2015    Procedure: RIGHT BREAST LUMPECTOMY WITH RADIOACTIVE SEED AND SENTINEL LYMPH NODE MAPPING;  Surgeon: Erroll Luna, MD;  Location: [facility];  Service: General;  Laterality: Right;  . Portacath placement Right 11/03/2015    Procedure: INSERTION PORT-A-CATH;  Surgeon: Erroll Luna, MD;  Location: [place];  Service: General;  Laterality: Right;    SOCIAL HISTORY: Social History   Social History  . Marital Status: Married    Spouse Name: N/A  . Number of Children: N/A  . Years of Education: N/A   Occupational History  . retired    Social History Main Topics  . Smoking status: Never Smoker   . Smokeless tobacco: Never Used  . Alcohol Use: No  . Drug Use: No  . Sexual Activity: No   Other Topics Concern  . Not on file   Social History Narrative  Married 51 years. 0 children. Fostered children. 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 She likes to sew, knit, and quilt. She likes to walk but does not do it often. Non smoker ETOH, none She is from [place] and moved to [place] when she got married.  FAMILY HISTORY: Family History  Problem Relation Age of Onset  . Breast cancer Mother   . Leukemia Father    has no family status information on file.  Father died at 56 yo of leukemia Mother died of old age; breast cancer diagnosis at 74 yo. She had a biopsy, no treatment. 1 sister, healthy. No breast cancer.  ALLERGIES:  is allergic to losartan and penicillins.  MEDICATIONS:  Current Outpatient Prescriptions  Medication Sig Dispense Refill  . albuterol (PROAIR HFA)  108 (90 BASE) MCG/ACT inhaler Inhale 2 puffs into the lungs every 6 (six) hours as needed for wheezing or shortness of breath.    Marland Kitchen atorvastatin (LIPITOR) 10 MG tablet Take 10 mg by mouth daily.    . budesonide-formoterol (SYMBICORT) 80-4.5 MCG/ACT inhaler Inhale 2 puffs into the lungs 2 (two) times daily.    . Calcium Carbonate-Vitamin D (CALCIUM + D PO) Take 1 tablet by mouth every morning.    . Cholecalciferol (VITAMIN D3) 2000 units TABS Take by mouth.    . dextromethorphan-guaiFENesin (MUCINEX DM) 30-600 MG 12hr tablet Take 1 tablet by mouth 2 (two) times daily. Reported on 01/28/2016    . famotidine (PEPCID) 20 MG tablet Take 20 mg by mouth 2 (two) times daily.    Marland Kitchen lidocaine-prilocaine (EMLA) cream Apply a quarter size amount to port site 1 hour prior to chemo. Do not rub in. Cover with plastic wrap. 30 g 3  . lisinopril (PRINIVIL,ZESTRIL) 5 MG tablet  Take 5 mg by mouth daily.    . metFORMIN (GLUCOPHAGE) 500 MG tablet Take 1 tablet (500 mg total) by mouth daily with breakfast. 30 tablet 2  . montelukast (SINGULAIR) 10 MG tablet     . omeprazole (PRILOSEC) 40 MG capsule Take 40 mg by mouth daily.    . ondansetron (ZOFRAN) 8 MG tablet Take 1 tablet (8 mg total) by mouth every 8 (eight) hours as needed for nausea or vomiting. (Patient not taking: Reported on 01/28/2016) 30 tablet 2  . PACLitaxel (TAXOL) 300 MG/50ML injection Inject into the vein. To start weekly Taxol x 12 weeks on January 12.    . prochlorperazine (COMPAZINE) 10 MG tablet Take 1 tablet (10 mg total) by mouth every 6 (six) hours as needed for nausea or vomiting. (Patient not taking: Reported on 01/28/2016) 30 tablet 2  . Trastuzumab (HERCEPTIN IV) Inject into the vein. To start on November 26, 2015.     No current facility-administered medications for this visit.   Facility-Administered Medications Ordered in Other Visits  Medication Dose Route Frequency Provider Last Rate Last Dose  . sodium chloride 0.9 % injection 10 mL  10  mL Intracatheter PRN Patrici Ranks, MD        Review of Systems  Constitutional: Negative for fever, chills and malaise/fatigue.  HENT: Negative.  Negative for congestion, hearing loss, nosebleeds, sore throat and tinnitus.   Eyes: Negative.  Negative for blurred vision, double vision, pain and discharge.  Respiratory: Negative.  Negative for cough, hemoptysis, sputum production, shortness of breath and wheezing.   Cardiovascular: Negative.  Negative for chest pain, palpitations, claudication, leg swelling and PND.  Gastrointestinal: Negative for heartburn, nausea, abdominal pain, diarrhea, constipation, blood in stool and melena.  Genitourinary: Negative.  Negative for dysuria, urgency, frequency and hematuria.  Musculoskeletal: Negative.  Negative for myalgias, joint pain and falls.  Skin: Negative.  Negative for itching and rash.  Neurological: Negative for dizziness, tingling, tremors, sensory change, speech change, focal weakness, seizures, loss of consciousness, weakness and headaches.       Intermittently drops items  Endo/Heme/Allergies: Negative.  Does not bruise/bleed easily.  Psychiatric/Behavioral: Negative for depression, suicidal ideas, memory loss and substance abuse. The patient is not nervous/anxious.   All other systems reviewed and are negative.  14 point ROS was done and is otherwise as detailed above or in HPI  PHYSICAL EXAMINATION: ECOG PERFORMANCE STATUS: 0 - Asymptomatic  Vitals with BMI 01/28/2016  Height   Weight   BMI   Systolic 076  Diastolic 78  Pulse 88  Respirations     Physical Exam  Constitutional: She is oriented to person, place, and time and well-developed, well-nourished, and in no distress.  Wears glasses. Wearing hair prosthesis and scarf. Well groomed  HENT:  Head: Normocephalic and atraumatic.  Nose: Nose normal.  Mouth/Throat: Oropharynx is clear and moist. No oropharyngeal exudate.  Eyes: Conjunctivae and EOM are normal. Pupils are  equal, round, and reactive to light. Right eye exhibits no discharge. Left eye exhibits no discharge. No scleral icterus.  Neck: Normal range of motion. Neck supple. No tracheal deviation present. No thyromegaly present.  Cardiovascular: Normal rate, regular rhythm and normal heart sounds.  Exam reveals no gallop and no friction rub.   No murmur heard. Pulmonary/Chest: Effort normal and breath sounds normal. She has no wheezes. She has no rales.  Abdominal: Soft. Bowel sounds are normal. She exhibits no distension and no mass. There is no tenderness. There is no  rebound and no guarding.  Musculoskeletal: Normal range of motion. She exhibits no edema.  Lymphadenopathy:    She has no cervical adenopathy.  Neurological: She is alert and oriented to person, place, and time. She has normal reflexes. No cranial nerve deficit. Gait normal. Coordination normal.  Skin: Skin is warm and dry. No rash noted.  Psychiatric: Mood, memory, affect and judgment normal.  Nursing note and vitals reviewed.   LABORATORY DATA:  I have reviewed the data as listed Lab Results  Component Value Date   WBC 5.5 01/28/2016   HGB 10.2* 01/28/2016   HCT 30.7* 01/28/2016   MCV 94.5 01/28/2016   PLT 302 01/28/2016   CMP     Component Value Date/Time   NA 139 01/28/2016 0907   K 3.6 01/28/2016 0907   CL 107 01/28/2016 0907   CO2 25 01/28/2016 0907   GLUCOSE 196* 01/28/2016 0907   BUN 8 01/28/2016 0907   CREATININE 0.90 01/28/2016 0907   CALCIUM 8.6* 01/28/2016 0907   PROT 5.8* 01/28/2016 0907   ALBUMIN 3.3* 01/28/2016 0907   AST 19 01/28/2016 0907   ALT 13* 01/28/2016 0907   ALKPHOS 83 01/28/2016 0907   BILITOT 0.7 01/28/2016 0907   GFRNONAA >60 01/28/2016 0907   GFRAA >60 01/28/2016 0907   RADIOGRAPHIC STUDIES: I have personally reviewed the radiological images as listed and agreed with the findings in the report. Study Result     CLINICAL DATA: Breast cancer. Patient may receive  cardiotoxic chemotherapy.  EXAM: NUCLEAR MEDICINE CARDIAC BLOOD POOL IMAGING (MUGA)  TECHNIQUE: Cardiac multi-gated acquisition was performed at rest following intravenous injection of Tc-72mlabeled red blood cells.  RADIOPHARMACEUTICALS: 25.5 mCi Tc-920mDP in-vitro labeled red blood cells IV  COMPARISON: None  FINDINGS: The left ventricular ejection fraction is calculated equal 71.9%. Gated images show normal left ventricular wall motion.  IMPRESSION: 1. Normal left ventricular ejection fraction equal to 71.9%.   Electronically Signed  By: TaKerby Moors.D.  On: 11/21/2015 13:00    PATHOLOGY:   ASSESSMENT & PLAN:  Invasive ductal carcinoma triple positive  ER+ (100%), PR+ (10%), HER 2 neu +  RIGHT breast cancer, upper inner quadrant Screening mammogram on 09/04/2015 with possible R breast mass 2 densities noted in the R breast upper inner quadrant within a centimeter of each other (9 and 10 cm from the nipple) 11/03/2015 partial mastectomy, sentinel node biopsy and port placement with Dr. CoBrantley Stageinal pathology [id number] ER+PR+ Her 2 neu+ R breast carcinoma Weekly Taxol/herceptin  She is doing well. Realistically has no complaints.  The patient was here today for cycle #10 of ongoing weekly taxol/herceptin chemotherapy. Goal is for completion of 12 cycles.   She has a radiation appointment next Wednesday with Dr. WePablo Ledger She needs a prescription refill of Metformin.  She will return for cycle #11 and a follow up on 02/04/16.  All questions were answered. The patient knows to call the clinic with any problems, questions or concerns.  This note was electronically signed.   This document serves as a record of services personally performed by ShAncil LinseyMD. It was created on her behalf by MaKandace Blitza trained medical scribe. The creation of this record is based on the scribe's personal observations and the provider's statements to  them. This document has been checked and approved by the attending provider.  I have reviewed the above documentation for accuracy and completeness, and I agree with the above.   PeMolli HazardMD  01/28/2016 1:46 PM

## 2016-01-29 NOTE — Progress Notes (Signed)
Location of Breast Cancer:Right Upper-Inner Quadrant   Histology per Pathology Report: Diagnosis   11-03-15 1. Breast, lumpectomy, Right - MULTIFOCAL INVASIVE DUCTAL CARCINOMA, SEE COMMENT. - INVASIVE TUMOR IS 1.0 MM FROM NEAREST MARGIN (INFERIOR). - HIGH GRADE DUCTAL CARCINOMA IN SITU WITH NECROSIS. - IN SITU CARCINOMA IS 0.1 MM FROM THE NEAREST MARGIN (INFERIOR). - PREVIOUS BIOPSY SITES (X2). - SEE TUMOR SYNOPTIC TEMPLATE BELOW. 2. Lymph node, sentinel, biopsy, Right axillary - ONE LYMPH NODE, NEGATIVE FOR TUMOR (0/1). 3. Lymph node, sentinel, biopsy, Right axillary - ONE LYMPH NODE, NEGATIVE FOR TUMOR (0/1). Microscopic Comment 1. BREAST, INVASIVE TUMOR, WITH LYMPH NODES PRESENT Specimen, including laterality and lymph node sampling (sentinel, non- Receptor Status: ER(100%), PR (10%), Her2-neu (amplification).Ki-67(20% and 10%)  Did patient present with symptoms (if so, please note symptoms) or was this found on screening mammography?:10-16 Lori Greene had an abnormal mammogram of the right breast.   Past/Anticipated interventions by surgeon, if any:11-03-15 Right Lumpectomy, 12-20-16Biopsy  Past/Anticipated interventions by medical oncology, if any: 11-26-15 Chemotherapy Paclitxel,Herceptin IV,Radiation consultation scheduled on the March 22nd with Dr. Pablo Ledger.   We discussed having her follow up with survivorship in the future.Marland Kitchen  She will have MUGA scheduled for 02/15/2016.  She will come back for a follow up in 2 weeks. Goal is to complete 12 weekly cycles of taxol/herceptin followed by completing one year of herceptin therapy  Lymphedema issues, if any:  No  Pain issues, if any: No  SAFETY ISSUES:  Prior radiation: No  Pacemaker/ICD?No   Possible current pregnancy?:No  Is the patient on methotrexate?:No  Current Complaints / other details: Here to discuss her radiation treatment plan  Menarche age 62,G0,P0,BC less than a year, HRT No BP 164/74 mmHg  Pulse 75   Temp(Src) 97.9 F (36.6 C) (Oral)  Resp 18  Ht 5" (0.127 m)  Wt 115 lb 4.8 oz (52.3 kg)  BMI 3242.61 kg/m2  SpO2 100%   [name], RN 01/29/2016,1:57 PM

## 2016-02-03 ENCOUNTER — Ambulatory Visit
Admission: RE | Admit: 2016-02-03 | Discharge: 2016-02-03 | Disposition: A | Discharge status: Home / Self Care | Payer: Medicare Other | Source: Ambulatory Visit | Attending: Radiation Oncology | Admitting: Radiation Oncology

## 2016-02-03 ENCOUNTER — Encounter: Payer: Self-pay | Admitting: Adult Health

## 2016-02-03 ENCOUNTER — Encounter: Payer: Self-pay | Admitting: Radiation Oncology

## 2016-02-03 VITALS — BP 164/74 | HR 75 | Temp 97.9°F | Resp 18 | Ht <= 58 in | Wt 115.3 lb

## 2016-02-03 DIAGNOSIS — C50211 Malignant neoplasm of upper-inner quadrant of right female breast: Secondary | ICD-10-CM | POA: Diagnosis not present

## 2016-02-03 DIAGNOSIS — Z510 Encounter for antineoplastic radiation therapy: Secondary | ICD-10-CM | POA: Diagnosis not present

## 2016-02-03 DIAGNOSIS — Z170 Estrogen receptor positive status [ER+]: Secondary | ICD-10-CM | POA: Insufficient documentation

## 2016-02-03 HISTORY — DX: Malignant neoplasm of unspecified site of unspecified female breast: C50.919

## 2016-02-03 NOTE — Progress Notes (Signed)
Radiation Oncology         [phone]) [phone] ________________________________  Initial outpatient Consultation - Date: 02/03/2016   Name: Lori Greene MRN: [id number]   DOB: 07/16/1941  REFERRING PHYSICIAN: Patrici Ranks, MD  DIAGNOSIS AND STAGE: Breast cancer of upper-inner quadrant of right female breast [facility])   Staging form: Breast, AJCC 7th Edition     Pathologic stage from 12/09/2015: Stage IA (T1c, N0, cM0) - Signed by Baird Cancer, PA-C on 12/09/2015   HISTORY OF PRESENT ILLNESS::Lori Greene is a 75 y.o. female with an abnormal mammogram. She had noted an area of firmness in her right breast, after biopsy. Her biopsy showed ER/PR positive, Her-2 positive breast cancer. She underwent lumpectomy and sentinel lymph node biopsy on 11/03/15 which showed a multifocal invasive ductal carcinoma with the largest measuring 1.2 cm. Margins were negative although there was a close in situ margin. She has undergone adjuvant chemotherapy with Taxol and Herceptin. She is sent for evaluation regarding radiation in the management of her disease. Her mother had breast cancer and her father had leukemia, but there is no other family history of malignancy. She is doing well with chemotherapy. She has 2 more chemotherapy treatments before completion. She will receive Herceptin at some point the next 21-28 days.   Today, she reports that she is tolerating chemotherapy really well. She still has a good appetite. She denies any lymphedema and pain issues.   PREVIOUS RADIATION THERAPY: No  Past medical, social and family history were reviewed in the electronic chart. Review of symptoms was reviewed in the electronic chart. Medications were reviewed in the electronic chart.   PHYSICAL EXAM:  Filed Vitals:   02/03/16 1412  BP: 164/74  Pulse: 75  Temp: 97.9 F (36.6 C)  Resp: 18    This is a well appearing female in no acute distress. Her incision is healing in the superior aspect of right breast. She  has a port-a-cath located just superior to that.   IMPRESSION: She is a 75 y.o female with T1c, N0 Triple positive right breast cancer.  PLAN: I spoke to the patient today regarding her diagnosis and options for treatment. We discussed the equivalence in terms of survival and local failure between mastectomy and breast conservation. We discussed the role of radiation in decreasing local failures in patients who undergo lumpectomy. We discussed the process of simulation and the placement tattoos. We discussed 6 weeks of treatment in 33 fractions as an outpatient. We discussed the possibility of asymptomatic lung damage. We discussed the low likelihood of secondary malignancies. We discussed the possible side effects including but not limited to skin redness, fatigue, permanent skin darkening, and breast swelling. I have requested that Lori Greene contact us via telephone when she has completed chemotherapy treatment. She has signed informed consent and is prepared to proceed with chemotherapy treatment. CT simulation will be scheduled after Lori Greene arranges her vacation and Herceptin treatment schedule. I have advised Lori Greene to call to schedule this CT simulation.   I spent 35 minutes face to face with the patient and more than 50% of that time was spent in counseling and/or coordination of care.   ------------------------------------------------  Thea Silversmith, MD  This document serves as a record of services personally performed by Thea Silversmith, MD. It was created on her behalf by Jenell Milliner, a trained medical scribe. The creation of this record is based on the scribe's personal observations and the provider's statements to them. This  document has been checked and approved by the attending provider.

## 2016-02-03 NOTE — Addendum Note (Signed)
Encounter addended by: Malena Edman, RN on: 02/03/2016  5:14 PM<BR>     Documentation filed: Charges VN

## 2016-02-04 ENCOUNTER — Encounter (HOSPITAL_BASED_OUTPATIENT_CLINIC_OR_DEPARTMENT_OTHER): Payer: Medicare Other | Admitting: Hematology & Oncology

## 2016-02-04 ENCOUNTER — Inpatient Hospital Stay (HOSPITAL_COMMUNITY): Payer: Medicare Other

## 2016-02-04 ENCOUNTER — Encounter (HOSPITAL_BASED_OUTPATIENT_CLINIC_OR_DEPARTMENT_OTHER): Payer: Medicare Other

## 2016-02-04 ENCOUNTER — Encounter (HOSPITAL_COMMUNITY): Payer: Self-pay | Admitting: Hematology & Oncology

## 2016-02-04 VITALS — BP 144/74 | HR 80 | Temp 98.5°F | Resp 16 | Ht 60.0 in | Wt 114.0 lb

## 2016-02-04 DIAGNOSIS — Z5112 Encounter for antineoplastic immunotherapy: Secondary | ICD-10-CM | POA: Diagnosis not present

## 2016-02-04 DIAGNOSIS — C50211 Malignant neoplasm of upper-inner quadrant of right female breast: Secondary | ICD-10-CM

## 2016-02-04 DIAGNOSIS — Z5111 Encounter for antineoplastic chemotherapy: Secondary | ICD-10-CM

## 2016-02-04 DIAGNOSIS — Z170 Estrogen receptor positive status [ER+]: Secondary | ICD-10-CM | POA: Diagnosis not present

## 2016-02-04 LAB — CBC WITH DIFFERENTIAL/PLATELET
BASOS ABS: 0.1 10*3/uL (ref 0.0–0.1)
BASOS PCT: 1 %
EOS PCT: 2 %
Eosinophils Absolute: 0.1 10*3/uL (ref 0.0–0.7)
HCT: 29.6 % — ABNORMAL LOW (ref 36.0–46.0)
Hemoglobin: 9.9 g/dL — ABNORMAL LOW (ref 12.0–15.0)
Lymphocytes Relative: 31 %
Lymphs Abs: 1.9 10*3/uL (ref 0.7–4.0)
MCH: 31.4 pg (ref 26.0–34.0)
MCHC: 33.4 g/dL (ref 30.0–36.0)
MCV: 94 fL (ref 78.0–100.0)
MONO ABS: 0.4 10*3/uL (ref 0.1–1.0)
Monocytes Relative: 6 %
Neutro Abs: 3.6 10*3/uL (ref 1.7–7.7)
Neutrophils Relative %: 60 %
PLATELETS: 277 10*3/uL (ref 150–400)
RBC: 3.15 MIL/uL — ABNORMAL LOW (ref 3.87–5.11)
RDW: 14.2 % (ref 11.5–15.5)
WBC: 6.1 10*3/uL (ref 4.0–10.5)

## 2016-02-04 LAB — COMPREHENSIVE METABOLIC PANEL
ALBUMIN: 3.3 g/dL — AB (ref 3.5–5.0)
ALT: 12 U/L — ABNORMAL LOW (ref 14–54)
ANION GAP: 6 (ref 5–15)
AST: 17 U/L (ref 15–41)
Alkaline Phosphatase: 81 U/L (ref 38–126)
BILIRUBIN TOTAL: 0.8 mg/dL (ref 0.3–1.2)
BUN: 11 mg/dL (ref 6–20)
CO2: 25 mmol/L (ref 22–32)
Calcium: 8.7 mg/dL — ABNORMAL LOW (ref 8.9–10.3)
Chloride: 106 mmol/L (ref 101–111)
Creatinine, Ser: 0.89 mg/dL (ref 0.44–1.00)
GFR calc Af Amer: >60 mL/min (ref >60)
GFR calc non Af Amer: >60 mL/min (ref >60)
GLUCOSE: 179 mg/dL — AB (ref 65–99)
POTASSIUM: 3.8 mmol/L (ref 3.5–5.1)
SODIUM: 137 mmol/L (ref 135–145)
TOTAL PROTEIN: 5.6 g/dL — AB (ref 6.5–8.1)

## 2016-02-04 MED ORDER — TRASTUZUMAB CHEMO INJECTION 440 MG
2.0000 mg/kg | Freq: Once | INTRAVENOUS | Status: AC
  Administered 2016-02-04: 105 mg via INTRAVENOUS
  Filled 2016-02-04: qty 5

## 2016-02-04 MED ORDER — DIPHENHYDRAMINE HCL 50 MG/ML IJ SOLN
50.0000 mg | Freq: Once | INTRAMUSCULAR | Status: AC
  Administered 2016-02-04: 50 mg via INTRAVENOUS
  Filled 2016-02-04: qty 1

## 2016-02-04 MED ORDER — HEPARIN SOD (PORK) LOCK FLUSH 100 UNIT/ML IV SOLN
INTRAVENOUS | Status: AC
  Filled 2016-02-04: qty 5

## 2016-02-04 MED ORDER — PACLITAXEL CHEMO INJECTION 300 MG/50ML
80.0000 mg/m2 | Freq: Once | INTRAVENOUS | Status: AC
  Administered 2016-02-04: 120 mg via INTRAVENOUS
  Filled 2016-02-04: qty 20

## 2016-02-04 MED ORDER — ACETAMINOPHEN 325 MG PO TABS
650.0000 mg | ORAL_TABLET | Freq: Once | ORAL | Status: AC
  Administered 2016-02-04: 650 mg via ORAL
  Filled 2016-02-04: qty 2

## 2016-02-04 MED ORDER — SODIUM CHLORIDE 0.9 % IJ SOLN
10.0000 mL | INTRAMUSCULAR | Status: DC | PRN
  Administered 2016-02-04: 10 mL
  Filled 2016-02-04: qty 10

## 2016-02-04 MED ORDER — SODIUM CHLORIDE 0.9 % IV SOLN
Freq: Once | INTRAVENOUS | Status: AC
  Administered 2016-02-04: 11:00:00 via INTRAVENOUS

## 2016-02-04 MED ORDER — SODIUM CHLORIDE 0.9 % IV SOLN
Freq: Once | INTRAVENOUS | Status: AC
  Administered 2016-02-04: 11:00:00 via INTRAVENOUS
  Filled 2016-02-04: qty 4

## 2016-02-04 MED ORDER — HEPARIN SOD (PORK) LOCK FLUSH 100 UNIT/ML IV SOLN
500.0000 [IU] | Freq: Once | INTRAVENOUS | Status: AC | PRN
  Administered 2016-02-04: 500 [IU]

## 2016-02-04 MED ORDER — FAMOTIDINE IN NACL 20-0.9 MG/50ML-% IV SOLN
20.0000 mg | Freq: Once | INTRAVENOUS | Status: AC
  Administered 2016-02-04: 20 mg via INTRAVENOUS
  Filled 2016-02-04: qty 50

## 2016-02-04 NOTE — Patient Instructions (Signed)
[place] at [facility] Discharge Instructions  RECOMMENDATIONS MADE BY THE CONSULTANT AND ANY TEST RESULTS WILL BE SENT TO YOUR REFERRING PHYSICIAN.   Exam and discussion by Dr Whitney Muse today Cycle 11 today, cycle 12 next week !st 3 weeks herceptin the week after cycle 12 Return to see the doctor next week Please call the clinic if you have any questions or concerns    Thank you for choosing [place] at [facility] to provide your oncology and hematology care.  To afford each patient quality time with our provider, please arrive at least 15 minutes before your scheduled appointment time.   Beginning January 23rd 2017 lab work for the [facility] will be done in the  Main lab at [facility] on 1st floor. If you have a lab appointment with the [facility] please come in thru the  Main Entrance and check in at the main information desk  You need to re-schedule your appointment should you arrive 10 or more minutes late.  We strive to give you quality time with our providers, and arriving late affects you and other patients whose appointments are after yours.  Also, if you no show three or more times for appointments you may be dismissed from the clinic at the providers discretion.     Again, thank you for choosing [facility].  Our hope is that these requests will decrease the amount of time that you wait before being seen by our physicians.       _____________________________________________________________  Should you have questions after your visit to [facility], please contact our office at [phone] between the hours of 8:30 a.m. and 4:30 p.m.  Voicemails left after 4:30 p.m. will not be returned until the following business day.  For prescription refill requests, have your pharmacy contact our office.         Resources For Cancer Patients and their Caregivers ? American Cancer Society: Can assist  with transportation, wigs, general needs, runs Look Good Feel Better.        702-557-1964 ? Cancer Care: Provides financial assistance, online support groups, medication/co-pay assistance.  1-800-813-HOPE 951-479-6477) ? [place] Assists [place] cancer patients and their families through emotional , educational and financial support.  216-011-0019 ? [place] DSS Where to apply for food stamps, Medicaid and utility assistance. 431-371-1783 ? RCATS: Transportation to medical appointments. 339-775-5981 ? Social Security Administration: May apply for disability if have a Stage IV cancer. [phone] 351-727-0193 ? [employer], Disability and Transit Services: Assists with nutrition, care and transit needs. [phone]

## 2016-02-04 NOTE — Progress Notes (Signed)
[name] at [facility] Progress Note  Patient Care Team: Lajean Manes, MD as PCP - General  CHIEF COMPLAINTS:  Invasive ductal carcinoma triple positive  ER+, PR+, HER 2 neu +  RIGHT breast cancer, upper inner quadrant Screening mammogram on 09/04/2015 with possible R breast mass 2 densities noted in the R breast upper inner quadrant within a centimeter of each other (9 and 10 cm from the nipple) 11/03/2015 partial mastectomy, sentinel node biopsy and port placement with Dr. Brantley Stage Final pathology [id number] ER+PR+ Her 2 neu+ R breast carcinoma  HISTORY OF PRESENTING ILLNESS:  Lori Greene 75 y.o. female is here for follow-up of  R breast cancer, disease is HER 2 neu positive.  Lori Greene was here alone today. She was in a treatment bed receiving cycle #11 of Paclitaxel and Herceptin.  She reports that she has been doing well, has been eating, and that her fingers and toes are good.She denies significant neuropathic symptoms.   She denies any nausea, vomiting, SOB.  She recently went to Dr. Pablo Ledger for radiation consultation. She said that it went well and that she really liked her.   She has no other concerns or problems at this time.   MEDICAL HISTORY:  Past Medical History  Diagnosis Date  . Hypertension   . Diabetes mellitus without complication ([place])   . Asthma   . GERD (gastroesophageal reflux disease)   . Cancer [facility]) 2016    right breast  . Breast cancer ([place])     Right breast    SURGICAL HISTORY: Past Surgical History  Procedure Laterality Date  . Abdominal hysterectomy  1983  . Nasal fracture surgery    . Breast lumpectomy with radioactive seed and sentinel lymph node biopsy Right 11/03/2015    Procedure: RIGHT BREAST LUMPECTOMY WITH RADIOACTIVE SEED AND SENTINEL LYMPH NODE MAPPING;  Surgeon: Erroll Luna, MD;  Location: [place];  Service: General;  Laterality: Right;  . Portacath placement Right 11/03/2015   Procedure: INSERTION PORT-A-CATH;  Surgeon: Erroll Luna, MD;  Location: [place];  Service: General;  Laterality: Right;    SOCIAL HISTORY: Social History   Social History  . Marital Status: Married    Spouse Name: N/A  . Number of Children: N/A  . Years of Education: N/A   Occupational History  . retired    Social History Main Topics  . Smoking status: Never Smoker   . Smokeless tobacco: Never Used  . Alcohol Use: No  . Drug Use: No  . Sexual Activity: No   Other Topics Concern  . Not on file   Social History Narrative  Married 51 years. 0 children. Fostered children. 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 She likes to sew, knit, and quilt. She likes to walk but does not do it often. Non smoker ETOH, none She is from [facility] and moved to [place] when she got married.  FAMILY HISTORY: Family History  Problem Relation Age of Onset  . Breast cancer Mother   . Leukemia Father    has no family status information on file.  Father died at 43 yo of leukemia Mother died of old age; breast cancer diagnosis at 74 yo. She had a biopsy, no treatment. 1 sister, healthy. No breast cancer.  ALLERGIES:  is allergic to losartan and penicillins.  MEDICATIONS:  Current Outpatient Prescriptions  Medication Sig Dispense Refill  . albuterol (PROAIR HFA) 108 (90 BASE) MCG/ACT inhaler  Inhale 2 puffs into the lungs every 6 (six) hours as needed for wheezing or shortness of breath.    Marland Kitchen atorvastatin (LIPITOR) 10 MG tablet Take 10 mg by mouth daily.    . beclomethasone (QVAR) 80 MCG/ACT inhaler Inhale 2 puffs into the lungs 2 (two) times daily.    . budesonide-formoterol (SYMBICORT) 80-4.5 MCG/ACT inhaler Inhale 2 puffs into the lungs 2 (two) times daily. Reported on 02/03/2016    . Calcium Carbonate-Vitamin D (CALCIUM + D PO) Take 1 tablet by mouth every morning.    . Cholecalciferol (VITAMIN D3) 2000 units TABS Take by mouth.    .  dextromethorphan-guaiFENesin (MUCINEX DM) 30-600 MG 12hr tablet Take 1 tablet by mouth 2 (two) times daily. Reported on 01/28/2016    . famotidine (PEPCID) 20 MG tablet Take 20 mg by mouth 2 (two) times daily.    Marland Kitchen lidocaine-prilocaine (EMLA) cream Apply a quarter size amount to port site 1 hour prior to chemo. Do not rub in. Cover with plastic wrap. 30 g 3  . lisinopril (PRINIVIL,ZESTRIL) 5 MG tablet Take 5 mg by mouth daily.    . metFORMIN (GLUCOPHAGE) 500 MG tablet Take 1 tablet (500 mg total) by mouth daily with breakfast. 30 tablet 2  . montelukast (SINGULAIR) 10 MG tablet     . omeprazole (PRILOSEC) 40 MG capsule Take 40 mg by mouth daily.    . ondansetron (ZOFRAN) 8 MG tablet Take 1 tablet (8 mg total) by mouth every 8 (eight) hours as needed for nausea or vomiting. 30 tablet 2  . PACLitaxel (TAXOL) 300 MG/50ML injection Inject into the vein. To start weekly Taxol x 12 weeks on January 12.    . prochlorperazine (COMPAZINE) 10 MG tablet Take 1 tablet (10 mg total) by mouth every 6 (six) hours as needed for nausea or vomiting. 30 tablet 2  . Trastuzumab (HERCEPTIN IV) Inject into the vein. To start on November 26, 2015.     No current facility-administered medications for this visit.   Facility-Administered Medications Ordered in Other Visits  Medication Dose Route Frequency Provider Last Rate Last Dose  . heparin lock flush 100 unit/mL  500 Units Intracatheter Once PRN Patrici Ranks, MD      . PACLitaxel (TAXOL) 120 mg in dextrose 5 % 250 mL chemo infusion (</= 80mg /m2)  80 mg/m2 (Treatment Plan Actual) Intravenous Once Patrici Ranks, MD      . sodium chloride 0.9 % injection 10 mL  10 mL Intracatheter PRN Patrici Ranks, MD   10 mL at 02/04/16 1039  . trastuzumab (HERCEPTIN) 105 mg in sodium chloride 0.9 % 250 mL chemo infusion  2 mg/kg (Treatment Plan Actual) Intravenous Once Patrici Ranks, MD        Review of Systems  Constitutional: Negative for fever, chills and  malaise/fatigue.  HENT: Negative.  Negative for congestion, hearing loss, nosebleeds, sore throat and tinnitus.   Eyes: Negative.  Negative for blurred vision, double vision, pain and discharge.  Respiratory: Negative.  Negative for cough, hemoptysis, sputum production, shortness of breath and wheezing.   Cardiovascular: Negative.  Negative for chest pain, palpitations, claudication, leg swelling and PND.  Gastrointestinal: Negative for heartburn, nausea, abdominal pain, diarrhea, constipation, blood in stool and melena.  Genitourinary: Negative.  Negative for dysuria, urgency, frequency and hematuria.  Musculoskeletal: Negative.  Negative for myalgias, joint pain and falls.  Skin: Negative.  Negative for itching and rash.  Neurological: Negative for dizziness, tingling, tremors, sensory change, speech  change, focal weakness, seizures, loss of consciousness, weakness and headaches.  Endo/Heme/Allergies: Negative.  Does not bruise/bleed easily.  Psychiatric/Behavioral: Negative for depression, suicidal ideas, memory loss and substance abuse. The patient is not nervous/anxious.   All other systems reviewed and are negative.  14 point ROS was done and is otherwise as detailed above or in HPI  PHYSICAL EXAMINATION: ECOG PERFORMANCE STATUS: 0 - Asymptomatic  Vitals with BMI 02/04/2016  Height   Weight 114 lbs 14 oz  BMI   Systolic Q000111Q  Diastolic 70  Pulse 78  Respirations 16   Physical Exam  Constitutional: She is oriented to person, place, and time and well-developed, well-nourished, and in no distress.  Wears glasses.  HENT:  Head: Normocephalic and atraumatic.  Nose: Nose normal.  Mouth/Throat: Oropharynx is clear and moist. No oropharyngeal exudate.  Eyes: Conjunctivae and EOM are normal. Pupils are equal, round, and reactive to light. Right eye exhibits no discharge. Left eye exhibits no discharge. No scleral icterus.  Neck: Normal range of motion. Neck supple. No tracheal deviation  present. No thyromegaly present.  Cardiovascular: Normal rate, regular rhythm and normal heart sounds.  Exam reveals no gallop and no friction rub.   No murmur heard. Pulmonary/Chest: Effort normal and breath sounds normal. She has no wheezes. She has no rales.  Abdominal: Soft. Bowel sounds are normal. She exhibits no distension and no mass. There is no tenderness. There is no rebound and no guarding.  Musculoskeletal: Normal range of motion. She exhibits no edema.  Lymphadenopathy:    She has no cervical adenopathy.  Neurological: She is alert and oriented to person, place, and time. She has normal reflexes. No cranial nerve deficit. Gait normal. Coordination normal.  Skin: Skin is warm and dry. No rash noted.  Psychiatric: Mood, memory, affect and judgment normal.  Nursing note and vitals reviewed.   LABORATORY DATA:  I have reviewed the data as listed Lab Results  Component Value Date   WBC 6.1 02/04/2016   HGB 9.9* 02/04/2016   HCT 29.6* 02/04/2016   MCV 94.0 02/04/2016   PLT 277 02/04/2016   CMP     Component Value Date/Time   NA 137 02/04/2016 0920   K 3.8 02/04/2016 0920   CL 106 02/04/2016 0920   CO2 25 02/04/2016 0920   GLUCOSE 179* 02/04/2016 0920   BUN 11 02/04/2016 0920   CREATININE 0.89 02/04/2016 0920   CALCIUM 8.7* 02/04/2016 0920   PROT 5.6* 02/04/2016 0920   ALBUMIN 3.3* 02/04/2016 0920   AST 17 02/04/2016 0920   ALT 12* 02/04/2016 0920   ALKPHOS 81 02/04/2016 0920   BILITOT 0.8 02/04/2016 0920   GFRNONAA >60 02/04/2016 0920   GFRAA >60 02/04/2016 0920     RADIOGRAPHIC STUDIES: I have personally reviewed the radiological images as listed and agreed with the findings in the report. Study Result     CLINICAL DATA: Breast cancer. Patient may receive cardiotoxic chemotherapy.  EXAM: NUCLEAR MEDICINE CARDIAC BLOOD POOL IMAGING (MUGA)  TECHNIQUE: Cardiac multi-gated acquisition was performed at rest following intravenous injection of Tc-61m  labeled red blood cells.  RADIOPHARMACEUTICALS: 25.5 mCi Tc-2m MDP in-vitro labeled red blood cells IV  COMPARISON: None  FINDINGS: The left ventricular ejection fraction is calculated equal 71.9%. Gated images show normal left ventricular wall motion.  IMPRESSION: 1. Normal left ventricular ejection fraction equal to 71.9%.   Electronically Signed  By: Kerby Moors M.D.  On: 11/21/2015 13:00    PATHOLOGY:   ASSESSMENT & PLAN:  Invasive ductal carcinoma triple positive  ER+ (100%), PR+ (10%), HER 2 neu +  RIGHT breast cancer, upper inner quadrant Screening mammogram on 09/04/2015 with possible R breast mass 2 densities noted in the R breast upper inner quadrant within a centimeter of each other (9 and 10 cm from the nipple) 11/03/2015 partial mastectomy, sentinel node biopsy and port placement with Dr. Brantley Stage Final pathology [id number] ER+PR+ Her 2 neu+ R breast carcinoma   She was here today for cycle #11 of weekly Paclitaxel and Herceptin.  She went to Dr. Pablo Ledger on 3/22 for radiation consultation.   She will return next week for cycle #12 of Paclitaxel and Herceptin. She will also have a follow up on that day. She will then continue forward with Q3 week herceptin. She will be due for another MUGA in early April.  All questions were answered. The patient knows to call the clinic with any problems, questions or concerns.  This note was electronically signed.   This document serves as a record of services personally performed by Ancil Linsey, MD. It was created on her behalf by Kandace Blitz, a trained medical scribe. The creation of this record is based on the scribe's personal observations and the provider's statements to them. This document has been checked and approved by the attending provider.  I have reviewed the above documentation for accuracy and completeness, and I agree with the above.   Molli Hazard, MD  02/04/2016 11:32  AM

## 2016-02-04 NOTE — Patient Instructions (Signed)
[facility] Discharge Instructions for Patients Receiving Chemotherapy   Beginning January 23rd 2017 lab work for the [facility] will be done in the  Main lab at [facility] on 1st floor. If you have a lab appointment with the [facility] please come in thru the  Main Entrance and check in at the main information desk   Today you received the following chemotherapy agents Taxol week 11 of 12 and Herceptin.  To help prevent nausea and vomiting after your treatment, we encourage you to take your nausea medication as instructed. If you develop nausea and vomiting, or diarrhea that is not controlled by your medication, call the clinic.  The clinic phone number is [phone]. Office hours are Monday-Friday 8:30am-5:00pm.  BELOW ARE SYMPTOMS THAT SHOULD BE REPORTED IMMEDIATELY:  *FEVER GREATER THAN 101.0 F  *CHILLS WITH OR WITHOUT FEVER  NAUSEA AND VOMITING THAT IS NOT CONTROLLED WITH YOUR NAUSEA MEDICATION  *UNUSUAL SHORTNESS OF BREATH  *UNUSUAL BRUISING OR BLEEDING  TENDERNESS IN MOUTH AND THROAT WITH OR WITHOUT PRESENCE OF ULCERS  *URINARY PROBLEMS  *BOWEL PROBLEMS  UNUSUAL RASH Items with * indicate a potential emergency and should be followed up as soon as possible. If you have an emergency after office hours please contact your primary care physician or go to the nearest emergency department.  Please call the clinic during office hours if you have any questions or concerns.   You may also contact the Patient Navigator at [phone] should you have any questions or need assistance in obtaining follow up care.  Resources For Cancer Patients and their Caregivers ? American Cancer Society: Can assist with transportation, wigs, general needs, runs Look Good Feel Better.        276-295-0026 ? Cancer Care: Provides financial assistance, online support groups, medication/co-pay assistance.  1-800-813-HOPE 503-282-2603) ? [place] Assists [place] cancer patients and their families through emotional , educational and financial support.  ([phone] ? [place] DSS Where to apply for food stamps, Medicaid and utility assistance. 567-105-2589 ? RCATS: Transportation to medical appointments. 905-283-0192 ? Social Security Administration: May apply for disability if have a Stage IV cancer. [phone] ? [employer], Disability and Transit Services: Assists with nutrition, care and transit needs. [phone]

## 2016-02-04 NOTE — Progress Notes (Signed)
I briefly met Ms. Choinski today after her consultation with Dr. Pablo Ledger. I let her know my role in her care after she completes treatment.  I gave her a copy of the "Live After Cancer for Every Survivor" booklet, as well as my business card and encouraged her to contact me with any questions.   I will plan on seeing her at [facility] for her survivorship visit sometime this summer after she completes her radiation therapy.  I look forward to participating in her care!   Mike Craze, NP [place] [phone]

## 2016-02-04 NOTE — Progress Notes (Signed)
Tolerated chemo well. Ambulatory on discharge home to self. 

## 2016-02-05 ENCOUNTER — Encounter: Payer: Self-pay | Admitting: Internal Medicine

## 2016-02-05 ENCOUNTER — Ambulatory Visit (INDEPENDENT_AMBULATORY_CARE_PROVIDER_SITE_OTHER): Payer: Medicare Other | Admitting: Internal Medicine

## 2016-02-05 ENCOUNTER — Other Ambulatory Visit (HOSPITAL_COMMUNITY): Payer: Self-pay | Admitting: Oncology

## 2016-02-05 VITALS — BP 154/70 | HR 74 | Ht 60.0 in | Wt 116.8 lb

## 2016-02-05 DIAGNOSIS — R05 Cough: Secondary | ICD-10-CM

## 2016-02-05 DIAGNOSIS — R053 Chronic cough: Secondary | ICD-10-CM

## 2016-02-05 DIAGNOSIS — R911 Solitary pulmonary nodule: Secondary | ICD-10-CM | POA: Diagnosis not present

## 2016-02-05 NOTE — Patient Instructions (Addendum)
ICD-9-CM ICD-10-CM   1. Chronic cough 786.2 R05   2. Lung nodule 793.11 R91.1     #Chronic cough - Glad cough is resolved and it has not recurred despite being on lisinopril again -You are at risk for future cough because of Herceptin, and lisinopril and presence of hiatal hernia - At this point in time no change in current management plan because of fear focus on breast cancer - We will review in one year but in the interim if you have cough please call us and return sooner  #4 mm right lower lobe lung nodule November 2016 - This is extremely low risk for any sort of cancer - Given history of breast cancer we will just see you in one year  - I will not do any CT scans but when you see me in one year will use data from the oncologist to decide on further follow-up of the nodule  #Follow-up - Return in one year or sooner if needed

## 2016-02-05 NOTE — Progress Notes (Signed)
Subjective:    Patient ID: Lori Greene, female    DOB: [birth year], 75 y.o.   MRN: [id number]  HPI   PCP Mathews Argyle, MD Referred byu Dr Remus Blake   OV 09/10/2015  Chief Complaint  Patient presents with  . Pulmonary Consult    Pt referred by Dr. Orvil Feil for wheezing, chest congestion, prod cough with white mucus intermittently x 12 months. Pt states when she has the congestion and cough she is also SOB, pt states these exacerbations occur every few weeks.    75 year old female referred by Dr. Remus Blake for episodes of cough. History is obtained from the patient and from review of Dr. Lilli Few note. She reports a few to several year history of episodes of chronic cough associated with white sputum characterized by interval periods of baseline persistent chronic cough associated with the same white sputum. At baseline cough is rated as mild associated with mild white sputum. Episodically maybe 1 or 2 times a year she would get worsening exacerbations characterized by symptoms getting to moderate to severe category. Typically these in the past been treated by primary care physician with a single cortisone shot or antibiotic therapy. However in 2016 she started noticing worsening intervals of exacerbation. Just this year alone she's had maybe 3 exacerbations. She was referred to Dr. Remus Blake a year ago. Was started on Symbicort according to her history but apparently this has not helped. She has used the Symbicort with a spacer according to referral notes. Also reports that between episodes she is not clearing up. She gives a history of negative allergy skin testing at initial intake with the allergist [I confirmed this on review of the note]. Most recent flareup was earlier in October 2016 a few weeks ago. She was given 5 days of prednisone and a Z-Pak course and this has helped clear up her symptoms. Currently she is asymptomatic. Off note when she had this acute visit with allergy in 08/24/2015  prior to initiation of prednisone and antibiotics her exhaled nitric oxide was slightly elevated at 38 ppb. Pulse ox was reported as 96% on room air at rest. Overall she does not report any dyspnea with exertion. She did have a chest x-rayChest x-ray 08/28/2015: Personally visualized and very similar to May 2014. The some bilateral bibasal atelectasis with a small hiatal hernia. This is consistent with the official report  Other cough relevant history - Off note she is not aware of a hiatal hernia diagnosis but she does admit to significant acid reflux disease for which she is on Prilosec. It is noted that she is on fish oil  - Sinus drainage and allergies: Allergy test is negative  - Pulmonary history: Denies any asthma pulmonary fibrosis diagnosis but chest x-ray does show bibasal atelectasis. Symbicort is not helping. As stated above exhaled nitric oxide was slightly elevated in October 2016. Walking desaturation test 185 feet 3 laps on room air did not desaturate     75 yo female seen for pulmonary consult in October 2016 for cough .   10/26/2015 Follow up : Cough  Pt returns for 6 week follow up for cough.  Pt was seen last ov for pulmonary consult for cough.  She was taken off of Lisinopril . She says her cough is some better. She was started on Losartan . Complains that she has developed a rash since starting this. Has a few whelps pop up along her arms and legs .  She does have severe GERD despite taking  Prilosec  Denies hemoptysis , chest pain, orthopnea, or edema.   CT chest on 11/1 showed extensive mild bronchiectasis , neg for ILD . Small 4 mm pulmonary nodule noted -she is a never smoker .  PFT today shows normal lung function with no airflow obstruction or restriction. nml DLCO .  O2 sats 97%.   Recently dx with breast cancer , plans for lumpectomy next week.    OV 02/05/2016  Chief Complaint  Patient presents with  . Follow-up    Pt states her cough has improved - but  is currently on steroids when doing chemo. Pt denies SOB and CP/tightness.    Follow-up chronic cough and 4 mm right lower lobe nodule November 2016  She is now getting breast cancer chemotherapy and radiation. She is on Herceptin. Her cough improved after I stopped the lisinopril. When I put her on losartan she picked up a rash. So she is back on lisinopril again but her cough is not recurrent. She is happy continuing the lisinopril for now. At this point in time she's focused on breast cancer chemotherapy and radiation therapy. I do notice that she is on Taxol's which puts her at risk for excision lung disease. I also noticed that she is on Herceptin which puts her at risk for cough. I explained these to her. At this point in time she is asymptomatic from a respiratory standpoint. She does not want to alter her medication regimen at this point   Current outpatient prescriptions:  .  albuterol (PROAIR HFA) 108 (90 BASE) MCG/ACT inhaler, Inhale 2 puffs into the lungs every 6 (six) hours as needed for wheezing or shortness of breath., Disp: , Rfl:  .  atorvastatin (LIPITOR) 10 MG tablet, Take 10 mg by mouth daily., Disp: , Rfl:  .  beclomethasone (QVAR) 80 MCG/ACT inhaler, Inhale 2 puffs into the lungs 2 (two) times daily., Disp: , Rfl:  .  Calcium Carbonate-Vitamin D (CALCIUM + D PO), Take 1 tablet by mouth every morning., Disp: , Rfl:  .  Cholecalciferol (VITAMIN D3) 2000 units TABS, Take by mouth., Disp: , Rfl:  .  dextromethorphan-guaiFENesin (MUCINEX DM) 30-600 MG 12hr tablet, Take 1 tablet by mouth 2 (two) times daily as needed. Reported on 01/28/2016, Disp: , Rfl:  .  famotidine (PEPCID) 20 MG tablet, Take 20 mg by mouth 2 (two) times daily., Disp: , Rfl:  .  lidocaine-prilocaine (EMLA) cream, Apply a quarter size amount to port site 1 hour prior to chemo. Do not rub in. Cover with plastic wrap., Disp: 30 g, Rfl: 3 .  lisinopril (PRINIVIL,ZESTRIL) 5 MG tablet, Take 5 mg by mouth daily., Disp: ,  Rfl:  .  metFORMIN (GLUCOPHAGE) 500 MG tablet, Take 1 tablet (500 mg total) by mouth daily with breakfast., Disp: 30 tablet, Rfl: 2 .  montelukast (SINGULAIR) 10 MG tablet, , Disp: , Rfl:  .  omeprazole (PRILOSEC) 40 MG capsule, Take 40 mg by mouth daily., Disp: , Rfl:  .  ondansetron (ZOFRAN) 8 MG tablet, Take 1 tablet (8 mg total) by mouth every 8 (eight) hours as needed for nausea or vomiting., Disp: 30 tablet, Rfl: 2 .  PACLitaxel (TAXOL) 300 MG/50ML injection, Inject into the vein. To start weekly Taxol x 12 weeks on January 12., Disp: , Rfl:  .  prochlorperazine (COMPAZINE) 10 MG tablet, Take 1 tablet (10 mg total) by mouth every 6 (six) hours as needed for nausea or vomiting., Disp: 30 tablet, Rfl: 2 .  Trastuzumab (  HERCEPTIN IV), Inject into the vein. To start on November 26, 2015., Disp: , Rfl:    Allergies  Allergen Reactions  . Lisinopril Cough  . Losartan Hives    Questionable losartan vs lidocaine reaction  . Penicillins Rash      Review of Systems     Objective:   Physical Exam  Filed Vitals:   02/05/16 1439  BP: 154/70  Pulse: 74  Height: 5' (1.524 m)  Weight: 116 lb 12.8 oz (52.98 kg)  SpO2: 98%  Body mass index is 22.81 kg/(m^2).  General exam: Frail female Psychiatry: Pleasant Central Asst.: Alert and oriented 3. Sits and stands normally moves all fours Respiratory exam: Clear to auscultation bilaterally Cardiovascular: Normal heart sounds Chest exam: Right Port-A-Cath present       Assessment & Plan:     ICD-9-CM ICD-10-CM   1. Chronic cough 786.2 R05   2. Lung nodule 793.11 R91.1      #Chronic cough - Glad cough is resolved and it has not recurred despite being on lisinopril again -You are at risk for future cough because of Herceptin, and lisinopril and presence of hiatal hernia - At this point in time no change in current management plan because of fear focus on breast cancer - We will review in one year but in the interim if you have  cough please call us and return sooner  #4 mm right lower lobe lung nodule November 2016 - This is extremely low risk for any sort of cancer - Given history of breast cancer we will just see you in one year  - I will not do any CT scans but when you see me in one year will use data from the oncologist to decide on further follow-up of the nodule  #Follow-up - Return in one year or sooner if needed  Dr. Brand Males, M.D., [facility].C.P Pulmonary and Critical Care Medicine Staff Physician [facility] Pager: [phone], If no answer or between  15:00h - 7:00h: call [phone]  02/05/2016 3:05 PM

## 2016-02-07 ENCOUNTER — Encounter (HOSPITAL_COMMUNITY): Payer: Self-pay | Admitting: Hematology & Oncology

## 2016-02-11 ENCOUNTER — Inpatient Hospital Stay (HOSPITAL_COMMUNITY): Payer: Medicare Other

## 2016-02-11 ENCOUNTER — Encounter (HOSPITAL_BASED_OUTPATIENT_CLINIC_OR_DEPARTMENT_OTHER): Payer: Medicare Other

## 2016-02-11 ENCOUNTER — Encounter (HOSPITAL_COMMUNITY): Payer: Self-pay | Admitting: Hematology & Oncology

## 2016-02-11 ENCOUNTER — Encounter (HOSPITAL_BASED_OUTPATIENT_CLINIC_OR_DEPARTMENT_OTHER): Payer: Medicare Other | Admitting: Hematology & Oncology

## 2016-02-11 VITALS — BP 175/73 | HR 76 | Temp 98.4°F | Resp 18

## 2016-02-11 VITALS — BP 173/78 | HR 82 | Temp 97.8°F | Resp 18 | Wt 115.6 lb

## 2016-02-11 DIAGNOSIS — Z5111 Encounter for antineoplastic chemotherapy: Secondary | ICD-10-CM

## 2016-02-11 DIAGNOSIS — Z170 Estrogen receptor positive status [ER+]: Secondary | ICD-10-CM

## 2016-02-11 DIAGNOSIS — C50211 Malignant neoplasm of upper-inner quadrant of right female breast: Secondary | ICD-10-CM

## 2016-02-11 DIAGNOSIS — T451X5A Adverse effect of antineoplastic and immunosuppressive drugs, initial encounter: Secondary | ICD-10-CM

## 2016-02-11 DIAGNOSIS — Z5112 Encounter for antineoplastic immunotherapy: Secondary | ICD-10-CM

## 2016-02-11 DIAGNOSIS — R030 Elevated blood-pressure reading, without diagnosis of hypertension: Secondary | ICD-10-CM

## 2016-02-11 DIAGNOSIS — G620 Drug-induced polyneuropathy: Secondary | ICD-10-CM

## 2016-02-11 LAB — COMPREHENSIVE METABOLIC PANEL
ALBUMIN: 3.3 g/dL — AB (ref 3.5–5.0)
ALT: 14 U/L (ref 14–54)
ANION GAP: 7 (ref 5–15)
AST: 22 U/L (ref 15–41)
Alkaline Phosphatase: 92 U/L (ref 38–126)
BILIRUBIN TOTAL: 0.7 mg/dL (ref 0.3–1.2)
BUN: 7 mg/dL (ref 6–20)
CO2: 25 mmol/L (ref 22–32)
Calcium: 8.4 mg/dL — ABNORMAL LOW (ref 8.9–10.3)
Chloride: 108 mmol/L (ref 101–111)
Creatinine, Ser: 0.9 mg/dL (ref 0.44–1.00)
GFR calc Af Amer: >60 mL/min (ref >60)
GFR calc non Af Amer: >60 mL/min (ref >60)
GLUCOSE: 183 mg/dL — AB (ref 65–99)
POTASSIUM: 3.5 mmol/L (ref 3.5–5.1)
SODIUM: 140 mmol/L (ref 135–145)
TOTAL PROTEIN: 5.7 g/dL — AB (ref 6.5–8.1)

## 2016-02-11 LAB — CBC WITH DIFFERENTIAL/PLATELET
BASOS PCT: 2 %
Basophils Absolute: 0.1 10*3/uL (ref 0.0–0.1)
EOS ABS: 0.1 10*3/uL (ref 0.0–0.7)
EOS PCT: 3 %
HCT: 31.4 % — ABNORMAL LOW (ref 36.0–46.0)
Hemoglobin: 10 g/dL — ABNORMAL LOW (ref 12.0–15.0)
Lymphocytes Relative: 34 %
Lymphs Abs: 1.7 10*3/uL (ref 0.7–4.0)
MCH: 30.7 pg (ref 26.0–34.0)
MCHC: 31.8 g/dL (ref 30.0–36.0)
MCV: 96.3 fL (ref 78.0–100.0)
MONO ABS: 0.4 10*3/uL (ref 0.1–1.0)
MONOS PCT: 7 %
Neutro Abs: 2.8 10*3/uL (ref 1.7–7.7)
Neutrophils Relative %: 55 %
PLATELETS: 278 10*3/uL (ref 150–400)
RBC: 3.26 MIL/uL — ABNORMAL LOW (ref 3.87–5.11)
RDW: 14 % (ref 11.5–15.5)
WBC: 5.1 10*3/uL (ref 4.0–10.5)

## 2016-02-11 MED ORDER — FAMOTIDINE IN NACL 20-0.9 MG/50ML-% IV SOLN
20.0000 mg | Freq: Once | INTRAVENOUS | Status: AC
  Administered 2016-02-11: 20 mg via INTRAVENOUS
  Filled 2016-02-11: qty 50

## 2016-02-11 MED ORDER — HEPARIN SOD (PORK) LOCK FLUSH 100 UNIT/ML IV SOLN
500.0000 [IU] | Freq: Once | INTRAVENOUS | Status: AC | PRN
  Administered 2016-02-11: 500 [IU]
  Filled 2016-02-11: qty 5

## 2016-02-11 MED ORDER — SODIUM CHLORIDE 0.9 % IV SOLN
Freq: Once | INTRAVENOUS | Status: AC
  Administered 2016-02-11: 11:00:00 via INTRAVENOUS
  Filled 2016-02-11: qty 4

## 2016-02-11 MED ORDER — SODIUM CHLORIDE 0.9 % IJ SOLN
10.0000 mL | INTRAMUSCULAR | Status: DC | PRN
  Administered 2016-02-11: 10 mL
  Filled 2016-02-11: qty 10

## 2016-02-11 MED ORDER — SODIUM CHLORIDE 0.9 % IV SOLN
Freq: Once | INTRAVENOUS | Status: AC
  Administered 2016-02-11: 09:00:00 via INTRAVENOUS

## 2016-02-11 MED ORDER — TRASTUZUMAB CHEMO INJECTION 440 MG
2.0000 mg/kg | Freq: Once | INTRAVENOUS | Status: AC
  Administered 2016-02-11: 105 mg via INTRAVENOUS
  Filled 2016-02-11: qty 5

## 2016-02-11 MED ORDER — DEXTROSE 5 % IV SOLN
80.0000 mg/m2 | Freq: Once | INTRAVENOUS | Status: AC
  Administered 2016-02-11: 120 mg via INTRAVENOUS
  Filled 2016-02-11: qty 20

## 2016-02-11 MED ORDER — DIPHENHYDRAMINE HCL 50 MG/ML IJ SOLN
50.0000 mg | Freq: Once | INTRAMUSCULAR | Status: AC
  Administered 2016-02-11: 50 mg via INTRAVENOUS
  Filled 2016-02-11: qty 1

## 2016-02-11 MED ORDER — ACETAMINOPHEN 325 MG PO TABS
650.0000 mg | ORAL_TABLET | Freq: Once | ORAL | Status: AC
  Administered 2016-02-11: 650 mg via ORAL
  Filled 2016-02-11: qty 2

## 2016-02-11 NOTE — Progress Notes (Signed)
[place] at [facility] Progress Note  Patient Care Team: Lajean Manes, MD as PCP - General  CHIEF COMPLAINTS:  Invasive ductal carcinoma triple positive  ER+, PR+, HER 2 neu +  RIGHT breast cancer, upper inner quadrant Screening mammogram on 09/04/2015 with possible R breast mass 2 densities noted in the R breast upper inner quadrant within a centimeter of each other (9 and 10 cm from the nipple) 11/03/2015 partial mastectomy, sentinel node biopsy and port placement with Dr. Brantley Stage Final pathology [id number] ER+PR+ Her 2 neu+ R breast carcinoma  HISTORY OF PRESENTING ILLNESS:  Jesus R Franca 75 y.o. female is here for follow-up of  R breast cancer, disease is HER 2 neu positive.  Mrs. Apkarian was here alone today in a treatment bed. She was receiving Cycle #12 of weekly Paclitaxel and Herceptin.  Her blood pressure was high today, we discussed changing the dose of her blood pressure medicine. She said that she wonders if her cough late last year was from her lisinopril although she notes it is now gone. She feels like her BP is only high when she comes here for treatment.   Her fingers feel fine. At night when she is laying down in bed it feels like there are pins sticking in her feet and this feeling continues up to her knees. This sometimes keeps her up. She does not notice this during the day and she is able to do what she wants. She notes her feeling is completely intact. She feels okay today, and last night her feet tingled a little bit but she did sleep. She does not stumble, fall, or trip. She can feel her shoes on her feet and can feel when something is touching her foot. Her feet are cold today but she states that this is normal when she comes here.   She is eating okay and her bowels are okay.  MEDICAL HISTORY:  Past Medical History  Diagnosis Date  . Hypertension   . Diabetes mellitus without complication ([place])   . Asthma   . GERD (gastroesophageal reflux  disease)   . Cancer [facility]) 2016    right breast  . Breast cancer ([place])     Right breast    SURGICAL HISTORY: Past Surgical History  Procedure Laterality Date  . Abdominal hysterectomy  1983  . Nasal fracture surgery    . Breast lumpectomy with radioactive seed and sentinel lymph node biopsy Right 11/03/2015    Procedure: RIGHT BREAST LUMPECTOMY WITH RADIOACTIVE SEED AND SENTINEL LYMPH NODE MAPPING;  Surgeon: Erroll Luna, MD;  Location: [place];  Service: General;  Laterality: Right;  . Portacath placement Right 11/03/2015    Procedure: INSERTION PORT-A-CATH;  Surgeon: Erroll Luna, MD;  Location: [place];  Service: General;  Laterality: Right;    SOCIAL HISTORY: Social History   Social History  . Marital Status: Married    Spouse Name: N/A  . Number of Children: N/A  . Years of Education: N/A   Occupational History  . retired    Social History Main Topics  . Smoking status: Never Smoker   . Smokeless tobacco: Never Used  . Alcohol Use: No  . Drug Use: No  . Sexual Activity: No   Other Topics Concern  . Not on file   Social History Narrative  Married 51 years. 0 children. Fostered children. 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 She likes to sew, knit, and quilt.  She likes to walk but does not do it often. Non smoker ETOH, none She is from [place] [facility] and moved to [place] when she got married.  FAMILY HISTORY: Family History  Problem Relation Age of Onset  . Breast cancer Mother   . Leukemia Father    has no family status information on file.  Father died at 41 yo of leukemia Mother died of old age; breast cancer diagnosis at 74 yo. She had a biopsy, no treatment. 1 sister, healthy. No breast cancer.  ALLERGIES:  is allergic to lisinopril; losartan; and penicillins.  MEDICATIONS:  Current Outpatient Prescriptions  Medication Sig Dispense Refill  . albuterol (PROAIR HFA) 108 (90  BASE) MCG/ACT inhaler Inhale 2 puffs into the lungs every 6 (six) hours as needed for wheezing or shortness of breath.    Marland Kitchen atorvastatin (LIPITOR) 10 MG tablet Take 10 mg by mouth daily.    . beclomethasone (QVAR) 80 MCG/ACT inhaler Inhale 2 puffs into the lungs 2 (two) times daily.    . Calcium Carbonate-Vitamin D (CALCIUM + D PO) Take 1 tablet by mouth every morning.    . Cholecalciferol (VITAMIN D3) 2000 units TABS Take by mouth.    . dextromethorphan-guaiFENesin (MUCINEX DM) 30-600 MG 12hr tablet Take 1 tablet by mouth 2 (two) times daily as needed. Reported on 01/28/2016    . famotidine (PEPCID) 20 MG tablet Take 20 mg by mouth 2 (two) times daily.    Marland Kitchen lidocaine-prilocaine (EMLA) cream Apply a quarter size amount to port site 1 hour prior to chemo. Do not rub in. Cover with plastic wrap. 30 g 3  . lisinopril (PRINIVIL,ZESTRIL) 5 MG tablet Take 5 mg by mouth daily.    . metFORMIN (GLUCOPHAGE) 500 MG tablet Take 1 tablet (500 mg total) by mouth daily with breakfast. 30 tablet 2  . montelukast (SINGULAIR) 10 MG tablet     . omeprazole (PRILOSEC) 40 MG capsule Take 40 mg by mouth daily.    . ondansetron (ZOFRAN) 8 MG tablet Take 1 tablet (8 mg total) by mouth every 8 (eight) hours as needed for nausea or vomiting. 30 tablet 2  . PACLitaxel (TAXOL) 300 MG/50ML injection Inject into the vein. To start weekly Taxol x 12 weeks on January 12.    . prochlorperazine (COMPAZINE) 10 MG tablet Take 1 tablet (10 mg total) by mouth every 6 (six) hours as needed for nausea or vomiting. 30 tablet 2  . Trastuzumab (HERCEPTIN IV) Inject into the vein. To start on November 26, 2015.     No current facility-administered medications for this visit.   Facility-Administered Medications Ordered in Other Visits  Medication Dose Route Frequency Provider Last Rate Last Dose  . famotidine (PEPCID) IVPB 20 mg premix  20 mg Intravenous Once Patrici Ranks, MD      . heparin lock flush 100 unit/mL  500 Units  Intracatheter Once PRN Patrici Ranks, MD      . ondansetron (ZOFRAN) 8 mg, dexamethasone (DECADRON) 20 mg in sodium chloride 0.9 % 50 mL IVPB   Intravenous Once Patrici Ranks, MD      . PACLitaxel (TAXOL) 120 mg in dextrose 5 % 250 mL chemo infusion (</= 80mg /m2)  80 mg/m2 (Treatment Plan Actual) Intravenous Once Patrici Ranks, MD      . sodium chloride 0.9 % injection 10 mL  10 mL Intracatheter PRN Patrici Ranks, MD   10 mL at 02/11/16 0915    Review of Systems  Constitutional:  Negative for fever, chills and malaise/fatigue.  HENT: Negative.  Negative for congestion, hearing loss, nosebleeds, sore throat and tinnitus.   Eyes: Negative.  Negative for blurred vision, double vision, pain and discharge.  Respiratory: Negative.  Negative for cough, hemoptysis, sputum production, shortness of breath and wheezing.   Cardiovascular: Negative.  Negative for chest pain, palpitations, claudication, leg swelling and PND.  Gastrointestinal: Negative for heartburn, nausea, abdominal pain, diarrhea, constipation, blood in stool and melena.  Genitourinary: Negative.  Negative for dysuria, urgency, frequency and hematuria.  Musculoskeletal: Negative.  Negative for myalgias, joint pain and falls.  Skin: Negative.  Negative for itching and rash.  Neurological: Negative for dizziness, tingling, tremors, sensory change, speech change, focal weakness, seizures, loss of consciousness, weakness and headaches.       Neuropathy at night in feet. "Feels like pins sticking in my feet"  Endo/Heme/Allergies: Negative.  Does not bruise/bleed easily.  Psychiatric/Behavioral: Negative for depression, suicidal ideas, memory loss and substance abuse. The patient is not nervous/anxious.   All other systems reviewed and are negative.  14 point ROS was done and is otherwise as detailed above or in HPI  PHYSICAL EXAMINATION: ECOG PERFORMANCE STATUS: 0 - Asymptomatic  Vitals with BMI 02/11/2016  Height   Weight  115 lbs 10 oz  BMI   Systolic [id number]  Diastolic 78  Pulse 82  Respirations 18   Physical Exam  Constitutional: She is oriented to person, place, and time and well-developed, well-nourished, and in no distress.  Wears glasses.  HENT:  Head: Normocephalic and atraumatic.  Nose: Nose normal.  Mouth/Throat: Oropharynx is clear and moist. No oropharyngeal exudate.  Eyes: Conjunctivae and EOM are normal. Pupils are equal, round, and reactive to light. Right eye exhibits no discharge. Left eye exhibits no discharge. No scleral icterus.  Neck: Normal range of motion. Neck supple. No tracheal deviation present. No thyromegaly present.  Cardiovascular: Normal rate, regular rhythm and normal heart sounds.  Exam reveals no gallop and no friction rub.   No murmur heard. Pulmonary/Chest: Effort normal and breath sounds normal. She has no wheezes. She has no rales.  Abdominal: Soft. Bowel sounds are normal. She exhibits no distension and no mass. There is no tenderness. There is no rebound and no guarding.  Musculoskeletal: Normal range of motion. She exhibits no edema.  Lymphadenopathy:    She has no cervical adenopathy.  Neurological: She is alert and oriented to person, place, and time. She has normal reflexes. No cranial nerve deficit. Gait normal. Coordination normal.  Skin: Skin is warm and dry. No rash noted.  Psychiatric: Mood, memory, affect and judgment normal.  Nursing note and vitals reviewed.   LABORATORY DATA:  I have reviewed the data as listed Lab Results  Component Value Date   WBC 5.1 02/11/2016   HGB 10.0* 02/11/2016   HCT 31.4* 02/11/2016   MCV 96.3 02/11/2016   PLT 278 02/11/2016   CMP     Component Value Date/Time   NA 140 02/11/2016 0905   K 3.5 02/11/2016 0905   CL 108 02/11/2016 0905   CO2 25 02/11/2016 0905   GLUCOSE 183* 02/11/2016 0905   BUN 7 02/11/2016 0905   CREATININE 0.90 02/11/2016 0905   CALCIUM 8.4* 02/11/2016 0905   PROT 5.7* 02/11/2016 0905    ALBUMIN 3.3* 02/11/2016 0905   AST 22 02/11/2016 0905   ALT 14 02/11/2016 0905   ALKPHOS 92 02/11/2016 0905   BILITOT 0.7 02/11/2016 0905   GFRNONAA >60 02/11/2016 [id number]  GFRAA >60 02/11/2016 0905    RADIOGRAPHIC STUDIES: I have personally reviewed the radiological images as listed and agreed with the findings in the report. Study Result     CLINICAL DATA: Breast cancer. Patient may receive cardiotoxic chemotherapy.  EXAM: NUCLEAR MEDICINE CARDIAC BLOOD POOL IMAGING (MUGA)  TECHNIQUE: Cardiac multi-gated acquisition was performed at rest following intravenous injection of Tc-51m labeled red blood cells.  RADIOPHARMACEUTICALS: 25.5 mCi Tc-37m MDP in-vitro labeled red blood cells IV  COMPARISON: None  FINDINGS: The left ventricular ejection fraction is calculated equal 71.9%. Gated images show normal left ventricular wall motion.  IMPRESSION: 1. Normal left ventricular ejection fraction equal to 71.9%.   Electronically Signed  By: Kerby Moors M.D.  On: 11/21/2015 13:00    PATHOLOGY:   ASSESSMENT & PLAN:  Invasive ductal carcinoma triple positive  ER+ (100%), PR+ (10%), HER 2 neu +  RIGHT breast cancer, upper inner quadrant Screening mammogram on 09/04/2015 with possible R breast mass 2 densities noted in the R breast upper inner quadrant within a centimeter of each other (9 and 10 cm from the nipple) 11/03/2015 partial mastectomy, sentinel node biopsy and port placement with Dr. Brantley Stage Final pathology [id number] ER+PR+ Her 2 neu+ R breast carcinoma Grade I chemotherapy induced neuropathy feet   She was here today for cycle #12 of weekly Paclitaxel and Herceptin. She will then continue forward with Q3 week herceptin. She will be due for another MUGA in early April.  Her blood pressure was high we discussed either getting her back to her PCP or adjusting her medication dose. She wishes to wait on this.    She will return for a follow up on 03/10/16  and for single agent herceptin.    All questions were answered. The patient knows to call the clinic with any problems, questions or concerns.  This note was electronically signed.   This document serves as a record of services personally performed by Ancil Linsey, MD. It was created on her behalf by Kandace Blitz, a trained medical scribe. The creation of this record is based on the scribe's personal observations and the provider's statements to them. This document has been checked and approved by the attending provider.  I have reviewed the above documentation for accuracy and completeness, and I agree with the above.   Molli Hazard, MD  02/11/2016 10:35 AM

## 2016-02-11 NOTE — Progress Notes (Signed)
Tolerated chemo well. Ambulatory on discharge home to self. 

## 2016-02-11 NOTE — Patient Instructions (Addendum)
[place] at [facility] Discharge Instructions  RECOMMENDATIONS MADE BY THE CONSULTANT AND ANY TEST RESULTS WILL BE SENT TO YOUR REFERRING PHYSICIAN.   Exam and discussion by Dr Whitney Muse today Herceptin 1st 3 week dose next week  Chemotherapy today as scheduled   Return to see the doctor in 1 month  Please call the clinic if you have any questions or concerns    Thank you for choosing [place] at [facility] to provide your oncology and hematology care.  To afford each patient quality time with our provider, please arrive at least 15 minutes before your scheduled appointment time.   Beginning January 23rd 2017 lab work for the [facility] will be done in the  Main lab at [facility] on 1st floor. If you have a lab appointment with the [facility] please come in thru the  Main Entrance and check in at the main information desk  You need to re-schedule your appointment should you arrive 10 or more minutes late.  We strive to give you quality time with our providers, and arriving late affects you and other patients whose appointments are after yours.  Also, if you no show three or more times for appointments you may be dismissed from the clinic at the providers discretion.     Again, thank you for choosing [facility].  Our hope is that these requests will decrease the amount of time that you wait before being seen by our physicians.       _____________________________________________________________  Should you have questions after your visit to [facility], please contact our office at [phone] between the hours of 8:30 a.m. and 4:30 p.m.  Voicemails left after 4:30 p.m. will not be returned until the following business day.  For prescription refill requests, have your pharmacy contact our office.         Resources For Cancer Patients and their Caregivers ? American Cancer Society: Can assist with  transportation, wigs, general needs, runs Look Good Feel Better.        812-782-6398 ? Cancer Care: Provides financial assistance, online support groups, medication/co-pay assistance.  1-800-813-HOPE 873-710-6554) ? [place] Assists [place] cancer patients and their families through emotional , educational and financial support.  864-299-8164 ? [place] DSS Where to apply for food stamps, Medicaid and utility assistance. 424-202-0207 ? [facility]: Transportation to medical appointments. 5301473897 ? Social Security Administration: May apply for disability if have a Stage IV cancer. 564-698-3212 (913)400-6233 ? [employer], Disability and Transit Services: Assists with nutrition, care and transit needs. 719-659-7907

## 2016-02-15 ENCOUNTER — Encounter (HOSPITAL_COMMUNITY)
Admission: RE | Admit: 2016-02-15 | Discharge: 2016-02-15 | Disposition: A | Discharge status: Home / Self Care | Payer: Medicare Other | Source: Ambulatory Visit | Attending: Oncology | Admitting: Oncology

## 2016-02-15 ENCOUNTER — Encounter (HOSPITAL_COMMUNITY): Payer: Self-pay

## 2016-02-15 DIAGNOSIS — Z79899 Other long term (current) drug therapy: Secondary | ICD-10-CM | POA: Diagnosis not present

## 2016-02-15 DIAGNOSIS — Z923 Personal history of irradiation: Secondary | ICD-10-CM | POA: Diagnosis not present

## 2016-02-15 DIAGNOSIS — C50211 Malignant neoplasm of upper-inner quadrant of right female breast: Secondary | ICD-10-CM | POA: Diagnosis not present

## 2016-02-15 DIAGNOSIS — T451X5A Adverse effect of antineoplastic and immunosuppressive drugs, initial encounter: Secondary | ICD-10-CM | POA: Diagnosis not present

## 2016-02-15 DIAGNOSIS — C50919 Malignant neoplasm of unspecified site of unspecified female breast: Secondary | ICD-10-CM | POA: Diagnosis not present

## 2016-02-15 MED ORDER — TECHNETIUM TC 99M-LABELED RED BLOOD CELLS IV KIT
25.0000 | PACK | Freq: Once | INTRAVENOUS | Status: AC | PRN
  Administered 2016-02-15: 25 via INTRAVENOUS

## 2016-02-15 MED ORDER — HEPARIN SOD (PORK) LOCK FLUSH 100 UNIT/ML IV SOLN
INTRAVENOUS | Status: AC
Start: 2016-02-15 — End: 2016-02-16
  Filled 2016-02-15: qty 5

## 2016-02-18 ENCOUNTER — Encounter (HOSPITAL_COMMUNITY): Payer: Medicare Other | Attending: Hematology & Oncology

## 2016-02-18 VITALS — BP 138/61 | HR 72 | Temp 97.9°F | Resp 16 | Wt 114.5 lb

## 2016-02-18 DIAGNOSIS — Z5112 Encounter for antineoplastic immunotherapy: Secondary | ICD-10-CM

## 2016-02-18 DIAGNOSIS — C50211 Malignant neoplasm of upper-inner quadrant of right female breast: Secondary | ICD-10-CM | POA: Diagnosis not present

## 2016-02-18 LAB — COMPREHENSIVE METABOLIC PANEL
ALBUMIN: 3.4 g/dL — AB (ref 3.5–5.0)
ALT: 10 U/L — ABNORMAL LOW (ref 14–54)
AST: 18 U/L (ref 15–41)
Alkaline Phosphatase: 79 U/L (ref 38–126)
Anion gap: 7 (ref 5–15)
BUN: 9 mg/dL (ref 6–20)
CHLORIDE: 107 mmol/L (ref 101–111)
CO2: 24 mmol/L (ref 22–32)
Calcium: 8.3 mg/dL — ABNORMAL LOW (ref 8.9–10.3)
Creatinine, Ser: 0.91 mg/dL (ref 0.44–1.00)
GFR calc Af Amer: >60 mL/min (ref >60)
Glucose, Bld: 141 mg/dL — ABNORMAL HIGH (ref 65–99)
POTASSIUM: 3.6 mmol/L (ref 3.5–5.1)
SODIUM: 138 mmol/L (ref 135–145)
Total Bilirubin: 0.7 mg/dL (ref 0.3–1.2)
Total Protein: 5.8 g/dL — ABNORMAL LOW (ref 6.5–8.1)

## 2016-02-18 LAB — CBC WITH DIFFERENTIAL/PLATELET
BASOS ABS: 0.1 10*3/uL (ref 0.0–0.1)
Basophils Relative: 1 %
EOS PCT: 2 %
Eosinophils Absolute: 0.1 10*3/uL (ref 0.0–0.7)
HCT: 30 % — ABNORMAL LOW (ref 36.0–46.0)
Hemoglobin: 9.9 g/dL — ABNORMAL LOW (ref 12.0–15.0)
LYMPHS ABS: 2.1 10*3/uL (ref 0.7–4.0)
LYMPHS PCT: 38 %
MCH: 31.5 pg (ref 26.0–34.0)
MCHC: 33 g/dL (ref 30.0–36.0)
MCV: 95.5 fL (ref 78.0–100.0)
MONO ABS: 0.3 10*3/uL (ref 0.1–1.0)
Monocytes Relative: 6 %
Neutro Abs: 3 10*3/uL (ref 1.7–7.7)
Neutrophils Relative %: 53 %
PLATELETS: 292 10*3/uL (ref 150–400)
RBC: 3.14 MIL/uL — ABNORMAL LOW (ref 3.87–5.11)
RDW: 14.7 % (ref 11.5–15.5)
WBC: 5.6 10*3/uL (ref 4.0–10.5)

## 2016-02-18 MED ORDER — ACETAMINOPHEN 325 MG PO TABS
650.0000 mg | ORAL_TABLET | Freq: Once | ORAL | Status: AC
  Administered 2016-02-18: 650 mg via ORAL

## 2016-02-18 MED ORDER — LIDOCAINE-PRILOCAINE 2.5-2.5 % EX CREA: TOPICAL_CREAM | CUTANEOUS | Status: DC

## 2016-02-18 MED ORDER — SODIUM CHLORIDE 0.9 % IV SOLN
Freq: Once | INTRAVENOUS | Status: AC
  Administered 2016-02-18: 12:00:00 via INTRAVENOUS

## 2016-02-18 MED ORDER — DIPHENHYDRAMINE HCL 25 MG PO CAPS
ORAL_CAPSULE | ORAL | Status: AC
  Filled 2016-02-18: qty 2

## 2016-02-18 MED ORDER — DIPHENHYDRAMINE HCL 25 MG PO CAPS
50.0000 mg | ORAL_CAPSULE | Freq: Once | ORAL | Status: AC
  Administered 2016-02-18: 50 mg via ORAL

## 2016-02-18 MED ORDER — ACETAMINOPHEN 325 MG PO TABS
ORAL_TABLET | ORAL | Status: AC
  Filled 2016-02-18: qty 2

## 2016-02-18 MED ORDER — SODIUM CHLORIDE 0.9% FLUSH: 10.0000 mL | INTRAVENOUS | Status: DC | PRN

## 2016-02-18 MED ORDER — HEPARIN SOD (PORK) LOCK FLUSH 100 UNIT/ML IV SOLN
500.0000 [IU] | Freq: Once | INTRAVENOUS | Status: AC | PRN
  Administered 2016-02-18: 500 [IU]
  Filled 2016-02-18: qty 5

## 2016-02-18 MED ORDER — TRASTUZUMAB CHEMO INJECTION 440 MG
6.0000 mg/kg | Freq: Once | INTRAVENOUS | Status: AC
  Administered 2016-02-18: 315 mg via INTRAVENOUS
  Filled 2016-02-18: qty 15

## 2016-02-18 NOTE — Progress Notes (Signed)
Patient tolerated infusion well.  VSS.   

## 2016-02-18 NOTE — Patient Instructions (Signed)
[facility] Discharge Instructions for Patients Receiving Chemotherapy   Beginning January 23rd 2017 lab work for the Cancer Center will be done in the  Main lab at [facility] on 1st floor. If you have a lab appointment with the Cancer Center please come in thru the  Main Entrance and check in at the main information desk   Today you received the following chemotherapy agent: Herceptin   If you develop nausea and vomiting, or diarrhea that is not controlled by your medication, call the clinic.  The clinic phone number is [phone]. Office hours are Monday-Friday 8:30am-5:00pm.  BELOW ARE SYMPTOMS THAT SHOULD BE REPORTED IMMEDIATELY:  *FEVER GREATER THAN 101.0 F  *CHILLS WITH OR WITHOUT FEVER  NAUSEA AND VOMITING THAT IS NOT CONTROLLED WITH YOUR NAUSEA MEDICATION  *UNUSUAL SHORTNESS OF BREATH  *UNUSUAL BRUISING OR BLEEDING  TENDERNESS IN MOUTH AND THROAT WITH OR WITHOUT PRESENCE OF ULCERS  *URINARY PROBLEMS  *BOWEL PROBLEMS  UNUSUAL RASH Items with * indicate a potential emergency and should be followed up as soon as possible. If you have an emergency after office hours please contact your primary care physician or go to the nearest emergency department.  Please call the clinic during office hours if you have any questions or concerns.   You may also contact the Patient Navigator at [phone] should you have any questions or need assistance in obtaining follow up care.      Resources For Cancer Patients and their Caregivers ? American Cancer Society: Can assist with transportation, wigs, general needs, runs Look Good Feel Better.        1-888-227-6333 ? Cancer Care: Provides financial assistance, online support groups, medication/co-pay assistance.  1-800-813-HOPE (4673) ? [facility] Assists [place] cancer patients and their families through emotional , educational and financial support.   336-427-4357 ? [place] DSS Where to apply for food stamps, Medicaid and utility assistance. 336-342-1394 ? RCATS: Transportation to medical appointments. 336-347-2287 ? Social Security Administration: May apply for disability if have a Stage IV cancer. 336-342-7796 1-800-772-1213 ? [place] [facility], Disability and Transit Services: Assists with nutrition, care and transit needs. [phone]          

## 2016-03-10 ENCOUNTER — Encounter (HOSPITAL_BASED_OUTPATIENT_CLINIC_OR_DEPARTMENT_OTHER): Payer: Medicare Other

## 2016-03-10 ENCOUNTER — Inpatient Hospital Stay (HOSPITAL_COMMUNITY): Payer: Medicare Other

## 2016-03-10 ENCOUNTER — Encounter (HOSPITAL_BASED_OUTPATIENT_CLINIC_OR_DEPARTMENT_OTHER): Payer: Medicare Other | Admitting: Hematology & Oncology

## 2016-03-10 ENCOUNTER — Encounter (HOSPITAL_COMMUNITY): Payer: Self-pay | Admitting: Hematology & Oncology

## 2016-03-10 VITALS — BP 149/68 | HR 81 | Temp 97.5°F | Resp 16 | Wt 113.0 lb

## 2016-03-10 DIAGNOSIS — G620 Drug-induced polyneuropathy: Secondary | ICD-10-CM

## 2016-03-10 DIAGNOSIS — Z170 Estrogen receptor positive status [ER+]: Secondary | ICD-10-CM

## 2016-03-10 DIAGNOSIS — C50211 Malignant neoplasm of upper-inner quadrant of right female breast: Secondary | ICD-10-CM | POA: Diagnosis not present

## 2016-03-10 DIAGNOSIS — C50911 Malignant neoplasm of unspecified site of right female breast: Secondary | ICD-10-CM

## 2016-03-10 DIAGNOSIS — Z5112 Encounter for antineoplastic immunotherapy: Secondary | ICD-10-CM | POA: Diagnosis not present

## 2016-03-10 LAB — COMPREHENSIVE METABOLIC PANEL
ALBUMIN: 3.6 g/dL (ref 3.5–5.0)
ALK PHOS: 102 U/L (ref 38–126)
ALT: 13 U/L — AB (ref 14–54)
AST: 21 U/L (ref 15–41)
Anion gap: 9 (ref 5–15)
BILIRUBIN TOTAL: 0.9 mg/dL (ref 0.3–1.2)
BUN: 10 mg/dL (ref 6–20)
CO2: 24 mmol/L (ref 22–32)
Calcium: 9 mg/dL (ref 8.9–10.3)
Chloride: 107 mmol/L (ref 101–111)
Creatinine, Ser: 0.91 mg/dL (ref 0.44–1.00)
GFR calc Af Amer: >60 mL/min (ref >60)
GFR calc non Af Amer: >60 mL/min (ref >60)
GLUCOSE: 166 mg/dL — AB (ref 65–99)
POTASSIUM: 3.6 mmol/L (ref 3.5–5.1)
Sodium: 140 mmol/L (ref 135–145)
TOTAL PROTEIN: 6 g/dL — AB (ref 6.5–8.1)

## 2016-03-10 LAB — CBC WITH DIFFERENTIAL/PLATELET
BASOS ABS: 0.1 10*3/uL (ref 0.0–0.1)
BASOS PCT: 2 %
Eosinophils Absolute: 0.6 10*3/uL (ref 0.0–0.7)
Eosinophils Relative: 8 %
HEMATOCRIT: 33.8 % — AB (ref 36.0–46.0)
HEMOGLOBIN: 11 g/dL — AB (ref 12.0–15.0)
Lymphocytes Relative: 29 %
Lymphs Abs: 2.1 10*3/uL (ref 0.7–4.0)
MCH: 30.6 pg (ref 26.0–34.0)
MCHC: 32.5 g/dL (ref 30.0–36.0)
MCV: 94.2 fL (ref 78.0–100.0)
Monocytes Absolute: 0.5 10*3/uL (ref 0.1–1.0)
Monocytes Relative: 6 %
NEUTROS ABS: 4.1 10*3/uL (ref 1.7–7.7)
NEUTROS PCT: 55 %
Platelets: 254 10*3/uL (ref 150–400)
RBC: 3.59 MIL/uL — AB (ref 3.87–5.11)
RDW: 13.8 % (ref 11.5–15.5)
WBC: 7.4 10*3/uL (ref 4.0–10.5)

## 2016-03-10 MED ORDER — SODIUM CHLORIDE 0.9 % IV SOLN
Freq: Once | INTRAVENOUS | Status: AC
  Administered 2016-03-10: 09:00:00 via INTRAVENOUS

## 2016-03-10 MED ORDER — HEPARIN SOD (PORK) LOCK FLUSH 100 UNIT/ML IV SOLN
500.0000 [IU] | Freq: Once | INTRAVENOUS | Status: AC | PRN
  Administered 2016-03-10: 500 [IU]
  Filled 2016-03-10: qty 5

## 2016-03-10 MED ORDER — TRASTUZUMAB CHEMO INJECTION 440 MG
6.0000 mg/kg | Freq: Once | INTRAVENOUS | Status: AC
  Administered 2016-03-10: 315 mg via INTRAVENOUS
  Filled 2016-03-10: qty 15

## 2016-03-10 MED ORDER — SODIUM CHLORIDE 0.9% FLUSH
10.0000 mL | INTRAVENOUS | Status: DC | PRN
  Administered 2016-03-10: 10 mL
  Filled 2016-03-10: qty 10

## 2016-03-10 MED ORDER — DIPHENHYDRAMINE HCL 25 MG PO CAPS
50.0000 mg | ORAL_CAPSULE | Freq: Once | ORAL | Status: AC
  Administered 2016-03-10: 50 mg via ORAL
  Filled 2016-03-10: qty 2

## 2016-03-10 MED ORDER — ACETAMINOPHEN 325 MG PO TABS
650.0000 mg | ORAL_TABLET | Freq: Once | ORAL | Status: AC
  Administered 2016-03-10: 650 mg via ORAL
  Filled 2016-03-10: qty 2

## 2016-03-10 NOTE — Progress Notes (Signed)
[place] at [facility] Progress Note  Patient Care Team: Lajean Manes, MD as PCP - General  CHIEF COMPLAINTS:  Invasive ductal carcinoma triple positive  ER+, PR+, HER 2 neu +  RIGHT breast cancer, upper inner quadrant Screening mammogram on 09/04/2015 with possible R breast mass 2 densities noted in the R breast upper inner quadrant within a centimeter of each other (9 and 10 cm from the nipple) 11/03/2015 partial mastectomy, sentinel node biopsy and port placement with Dr. Brantley Stage Final pathology [id number] ER+PR+ Her 2 neu+ R breast carcinoma  HISTORY OF PRESENTING ILLNESS:  Lori Greene 75 y.o. female is here for follow-up of  R breast cancer, disease is HER 2 neu positive.  Lori Greene was here alone today in a treatment bed. She was receiving Cycle #2 of Q3 week Herceptin.  She feels pretty good today.  She has a little cough. She said that this hasn't been bad and she treats it with Mucinex. This is a chronic off/on problem.  She has been having bowel movements but she feels like she hasn't been emptying. She recently went to the beach and she did not take any Miralax at the beach.   Her appetite has been good and her taste is coming back.  She is going to [place] next Tuesday for radiation.  She denies any fever.  MEDICAL HISTORY:  Past Medical History  Diagnosis Date  . Hypertension   . Diabetes mellitus without complication ([place])   . Asthma   . GERD (gastroesophageal reflux disease)   . Cancer [facility]) 2016    right breast  . Breast cancer ([place])     Right breast    SURGICAL HISTORY: Past Surgical History  Procedure Laterality Date  . Abdominal hysterectomy  1983  . Nasal fracture surgery    . Breast lumpectomy with radioactive seed and sentinel lymph node biopsy Right 11/03/2015    Procedure: RIGHT BREAST LUMPECTOMY WITH RADIOACTIVE SEED AND SENTINEL LYMPH NODE MAPPING;  Surgeon: Erroll Luna, MD;  Location: [facility];   Service: General;  Laterality: Right;  . Portacath placement Right 11/03/2015    Procedure: INSERTION PORT-A-CATH;  Surgeon: Erroll Luna, MD;  Location: [facility];  Service: General;  Laterality: Right;    SOCIAL HISTORY: Social History   Social History  . Marital Status: Married    Spouse Name: N/A  . Number of Children: N/A  . Years of Education: N/A   Occupational History  . retired    Social History Main Topics  . Smoking status: Never Smoker   . Smokeless tobacco: Never Used  . Alcohol Use: No  . Drug Use: No  . Sexual Activity: No   Other Topics Concern  . Not on file   Social History Narrative  Married 51 years. 0 children. Fostered children. 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 She likes to sew, knit, and quilt. She likes to walk but does not do it often. Non smoker ETOH, none She is from [place] and moved to [place] when she got married.  FAMILY HISTORY: Family History  Problem Relation Age of Onset  . Breast cancer Mother   . Leukemia Father    has no family status information on file.  Father died at 13 yo of leukemia Mother died of old age; breast cancer diagnosis at 74 yo. She had a biopsy, no treatment. 1 sister, healthy. No breast cancer.  ALLERGIES:  is  allergic to lisinopril; losartan; and penicillins.  MEDICATIONS:  Current Outpatient Prescriptions  Medication Sig Dispense Refill  . albuterol (PROAIR HFA) 108 (90 BASE) MCG/ACT inhaler Inhale 2 puffs into the lungs every 6 (six) hours as needed for wheezing or shortness of breath.    Marland Kitchen atorvastatin (LIPITOR) 10 MG tablet Take 10 mg by mouth daily.    . beclomethasone (QVAR) 80 MCG/ACT inhaler Inhale 2 puffs into the lungs 2 (two) times daily.    . Calcium Carbonate-Vitamin D (CALCIUM + D PO) Take 1 tablet by mouth every morning.    . Cholecalciferol (VITAMIN D3) 2000 units TABS Take by mouth.    . dextromethorphan-guaiFENesin (MUCINEX  DM) 30-600 MG 12hr tablet Take 1 tablet by mouth 2 (two) times daily as needed. Reported on 01/28/2016    . famotidine (PEPCID) 20 MG tablet Take 20 mg by mouth 2 (two) times daily.    Marland Kitchen lidocaine-prilocaine (EMLA) cream Apply a quarter size amount to port site 1 hour prior to chemo. Do not rub in. Cover with plastic wrap. 30 g 3  . lidocaine-prilocaine (EMLA) cream Apply to affected area once 30 g 3  . lisinopril (PRINIVIL,ZESTRIL) 5 MG tablet Take 5 mg by mouth daily.    . metFORMIN (GLUCOPHAGE) 500 MG tablet Take 1 tablet (500 mg total) by mouth daily with breakfast. 30 tablet 2  . montelukast (SINGULAIR) 10 MG tablet     . omeprazole (PRILOSEC) 40 MG capsule Take 40 mg by mouth daily.    . ondansetron (ZOFRAN) 8 MG tablet Take 1 tablet (8 mg total) by mouth every 8 (eight) hours as needed for nausea or vomiting. 30 tablet 2  . prochlorperazine (COMPAZINE) 10 MG tablet Take 1 tablet (10 mg total) by mouth every 6 (six) hours as needed for nausea or vomiting. 30 tablet 2  . Trastuzumab (HERCEPTIN IV) Inject into the vein. To start on November 26, 2015.     No current facility-administered medications for this visit.   Facility-Administered Medications Ordered in Other Visits  Medication Dose Route Frequency Provider Last Rate Last Dose  . sodium chloride flush (NS) 0.9 % injection 10 mL  10 mL Intracatheter PRN Patrici Ranks, MD   10 mL at 03/10/16 [id number]    Review of Systems  Constitutional: Negative for fever, chills and malaise/fatigue.  HENT: Negative.  Negative for congestion, hearing loss, nosebleeds, sore throat and tinnitus.   Eyes: Negative.  Negative for blurred vision, double vision, pain and discharge.  Respiratory: Positive for cough. Negative for hemoptysis, sputum production, shortness of breath and wheezing.        Cough treated with Mucinex  Cardiovascular: Negative.  Negative for chest pain, palpitations, claudication, leg swelling and PND.  Gastrointestinal: Negative  for heartburn, nausea, abdominal pain, diarrhea, constipation, blood in stool and melena.  Genitourinary: Negative.  Negative for dysuria, urgency, frequency and hematuria.  Musculoskeletal: Negative.  Negative for myalgias, joint pain and falls.  Skin: Negative.  Negative for itching and rash.  Neurological: Negative for dizziness, tingling, tremors, sensory change, speech change, focal weakness, seizures, loss of consciousness, weakness and headaches.  Endo/Heme/Allergies: Negative.  Does not bruise/bleed easily.  Psychiatric/Behavioral: Negative for depression, suicidal ideas, memory loss and substance abuse. The patient is not nervous/anxious.   All other systems reviewed and are negative.  14 point ROS was done and is otherwise as detailed above or in HPI  PHYSICAL EXAMINATION: ECOG PERFORMANCE STATUS: 0 - Asymptomatic   Vitals with BMI 03/10/2016  Height   Weight 113 lbs  BMI   Systolic 123456  Diastolic 68  Pulse 81  Respirations 16    Physical Exam  Constitutional: She is oriented to person, place, and time and well-developed, well-nourished, and in no distress.  Wears glasses.  HENT:  Head: Normocephalic and atraumatic.  Nose: Nose normal.  Mouth/Throat: Oropharynx is clear and moist. No oropharyngeal exudate.  Eyes: Conjunctivae and EOM are normal. Pupils are equal, round, and reactive to light. Right eye exhibits no discharge. Left eye exhibits no discharge. No scleral icterus.  Neck: Normal range of motion. Neck supple. No tracheal deviation present. No thyromegaly present.  Cardiovascular: Normal rate, regular rhythm and normal heart sounds.  Exam reveals no gallop and no friction rub.   No murmur heard. Pulmonary/Chest: Effort normal and breath sounds normal. She has no wheezes. She has no rales.  Abdominal: Soft. Bowel sounds are normal. She exhibits no distension and no mass. There is no tenderness. There is no rebound and no guarding.  Musculoskeletal: Normal range  of motion. She exhibits no edema.  Lymphadenopathy:    She has no cervical adenopathy.  Neurological: She is alert and oriented to person, place, and time. She has normal reflexes. No cranial nerve deficit. Gait normal. Coordination normal.  Skin: Skin is warm and dry. No rash noted.  Psychiatric: Mood, memory, affect and judgment normal.  Nursing note and vitals reviewed.   LABORATORY DATA:  I have reviewed the data as listed Lab Results  Component Value Date   WBC 7.4 03/10/2016   HGB 11.0* 03/10/2016   HCT 33.8* 03/10/2016   MCV 94.2 03/10/2016   PLT 254 03/10/2016   CMP     Component Value Date/Time   NA 140 03/10/2016 0850   K 3.6 03/10/2016 0850   CL 107 03/10/2016 0850   CO2 24 03/10/2016 0850   GLUCOSE 166* 03/10/2016 0850   BUN 10 03/10/2016 0850   CREATININE 0.91 03/10/2016 0850   CALCIUM 9.0 03/10/2016 0850   PROT 6.0* 03/10/2016 0850   ALBUMIN 3.6 03/10/2016 0850   AST 21 03/10/2016 0850   ALT 13* 03/10/2016 0850   ALKPHOS 102 03/10/2016 0850   BILITOT 0.9 03/10/2016 0850   GFRNONAA >60 03/10/2016 0850   GFRAA >60 03/10/2016 0850     RADIOGRAPHIC STUDIES: I have personally reviewed the radiological images as listed and agreed with the findings in the report.   Study Result     CLINICAL DATA: Breast cancer, on Herceptin therapy  EXAM: NUCLEAR MEDICINE CARDIAC BLOOD POOL IMAGING (MUGA)  TECHNIQUE: Cardiac multi-gated acquisition was performed at rest following intravenous injection of Tc-69m labeled red blood cells.  RADIOPHARMACEUTICALS: 22.5 mCi Tc-44m in-vitro labeled autologous red blood cells IV  COMPARISON: 11/19/2015  FINDINGS: LEFT ventricular ejection fraction is calculated at 65%, slightly decreased 72% on the previous exam.  Cine analysis of the LEFT ventricle demonstrates normal LV wall motion.  IMPRESSION: Normal LEFT ventricular ejection fraction 65% slightly decreased from 72% on the previous exam.  Normal LV  wall motion.   Electronically Signed  By: Lavonia Dana M.D.  On: 02/15/2016 14:17   Study Result     CLINICAL DATA: Breast cancer. Patient may receive cardiotoxic chemotherapy.  EXAM: NUCLEAR MEDICINE CARDIAC BLOOD POOL IMAGING (MUGA)  TECHNIQUE: Cardiac multi-gated acquisition was performed at rest following intravenous injection of Tc-20m labeled red blood cells.  RADIOPHARMACEUTICALS: 25.5 mCi Tc-40m MDP in-vitro labeled red blood cells IV  COMPARISON: None  FINDINGS: The left ventricular  ejection fraction is calculated equal 71.9%. Gated images show normal left ventricular wall motion.  IMPRESSION: 1. Normal left ventricular ejection fraction equal to 71.9%.   Electronically Signed  By: Kerby Moors M.D.  On: 11/21/2015 13:00    PATHOLOGY:   ASSESSMENT & PLAN:  Invasive ductal carcinoma triple positive  ER+ (100%), PR+ (10%), HER 2 neu +  RIGHT breast cancer, upper inner quadrant Screening mammogram on 09/04/2015 with possible R breast mass 2 densities noted in the R breast upper inner quadrant within a centimeter of each other (9 and 10 cm from the nipple) 11/03/2015 partial mastectomy, sentinel node biopsy and port placement with Dr. Brantley Stage Final pathology [id number] ER+PR+ Her 2 neu+ R breast carcinoma Grade I chemotherapy induced neuropathy feet   Lori Greene is here today for ongoing therapy with herceptin. We reviewed her MUGA currently no need to hold therapy. Will continue ongoing close observation of cardiac function.   She is going to [place] next Tuesday for radiation. We again reviewed the timeline to initiate endocrine therapy. At her follow-up in 3 weeks we will discuss endocrine therapy treatment options. She will also be provided with reading/educational information.   Her next chemotherapy is 04/01/16.  She will return in 3 weeks for a follow up.   All questions were answered. The patient knows to call the clinic with  any problems, questions or concerns.  This note was electronically signed.   This document serves as a record of services personally performed by Ancil Linsey, MD. It was created on her behalf by Kandace Blitz, a trained medical scribe. The creation of this record is based on the scribe's personal observations and the provider's statements to them. This document has been checked and approved by the attending provider.  I have reviewed the above documentation for accuracy and completeness, and I agree with the above. Molli Hazard, MD  03/10/2016 10:52 AM

## 2016-03-10 NOTE — Patient Instructions (Addendum)
[place] at [facility] Discharge Instructions  RECOMMENDATIONS MADE BY THE CONSULTANT AND ANY TEST RESULTS WILL BE SENT TO YOUR REFERRING PHYSICIAN.   Exam and discussion by Dr Whitney Muse today Herceptin today herceptin every 3 weeks  Return to see the doctor in 3 weeks We will talk about endocrine therapy and getting your muga scheduled Please call the clinic if you have any questions or concerns      Thank you for choosing [place] at [facility] to provide your oncology and hematology care.  To afford each patient quality time with our provider, please arrive at least 15 minutes before your scheduled appointment time.   Beginning January 23rd 2017 lab work for the [facility] will be done in the  Main lab at [facility] on 1st floor. If you have a lab appointment with the [facility] please come in thru the  Main Entrance and check in at the main information desk  You need to re-schedule your appointment should you arrive 10 or more minutes late.  We strive to give you quality time with our providers, and arriving late affects you and other patients whose appointments are after yours.  Also, if you no show three or more times for appointments you may be dismissed from the clinic at the providers discretion.     Again, thank you for choosing [facility].  Our hope is that these requests will decrease the amount of time that you wait before being seen by our physicians.       _____________________________________________________________  Should you have questions after your visit to [facility], please contact our office at [phone] between the hours of 8:30 a.m. and 4:30 p.m.  Voicemails left after 4:30 p.m. will not be returned until the following business day.  For prescription refill requests, have your pharmacy contact our office.         Resources For Cancer Patients and their  Caregivers ? American Cancer Society: Can assist with transportation, wigs, general needs, runs Look Good Feel Better.        801-818-9580 ? Cancer Care: Provides financial assistance, online support groups, medication/co-pay assistance.  1-800-813-HOPE 418 814 6545) ? [facility] Assists [place] cancer patients and their families through emotional , educational and financial support.  (903) 552-5263 ? [place] DSS Where to apply for food stamps, Medicaid and utility assistance. 908-533-5468 ? RCATS: Transportation to medical appointments. (386) 877-6840 ? Social Security Administration: May apply for disability if have a Stage IV cancer. [phone] [phone] ? [employer], Disability and Transit Services: Assists with nutrition, care and transit needs. [phone]

## 2016-03-10 NOTE — Patient Instructions (Signed)
[facility] Discharge Instructions for Patients Receiving Chemotherapy   Beginning January 23rd 2017 lab work for the [facility] will be done in the  Main lab at [facility] on 1st floor. If you have a lab appointment with the [facility] please come in thru the  Main Entrance and check in at the main information desk   Today you received the following chemotherapy agents Herceptin.  To help prevent nausea and vomiting after your treatment, we encourage you to take your nausea medication as instructed.  If you develop nausea and vomiting, or diarrhea that is not controlled by your medication, call the clinic.  The clinic phone number is [phone]. Office hours are Monday-Friday 8:30am-5:00pm.  BELOW ARE SYMPTOMS THAT SHOULD BE REPORTED IMMEDIATELY:  *FEVER GREATER THAN 101.0 F  *CHILLS WITH OR WITHOUT FEVER  NAUSEA AND VOMITING THAT IS NOT CONTROLLED WITH YOUR NAUSEA MEDICATION  *UNUSUAL SHORTNESS OF BREATH  *UNUSUAL BRUISING OR BLEEDING  TENDERNESS IN MOUTH AND THROAT WITH OR WITHOUT PRESENCE OF ULCERS  *URINARY PROBLEMS  *BOWEL PROBLEMS  UNUSUAL RASH Items with * indicate a potential emergency and should be followed up as soon as possible. If you have an emergency after office hours please contact your primary care physician or go to the nearest emergency department.  Please call the clinic during office hours if you have any questions or concerns.   You may also contact the Patient Navigator at [phone] should you have any questions or need assistance in obtaining follow up care.  Resources For Cancer Patients and their Caregivers ? American Cancer Society: Can assist with transportation, wigs, general needs, runs Look Good Feel Better.        678-877-5693 ? Cancer Care: Provides financial assistance, online support groups, medication/co-pay assistance.  1-800-813-HOPE 409-784-2995) ? [facility] Assists [place] cancer patients and their families through emotional , educational and financial support.  919-250-8225 ? [place] DSS Where to apply for food stamps, Medicaid and utility assistance. (602)455-2606 ? RCATS: Transportation to medical appointments. 973-389-3984 ? Social Security Administration: May apply for disability if have a Stage IV cancer. [phone] 647-046-1634 ? [employer], Disability and Transit Services: Assists with nutrition, care and transit needs. 873-326-6311

## 2016-03-15 ENCOUNTER — Other Ambulatory Visit: Payer: Self-pay | Admitting: Radiation Oncology

## 2016-03-15 ENCOUNTER — Ambulatory Visit
Admission: RE | Admit: 2016-03-15 | Discharge: 2016-03-15 | Disposition: A | Discharge status: Home / Self Care | Payer: Medicare Other | Source: Ambulatory Visit | Attending: Radiation Oncology | Admitting: Radiation Oncology

## 2016-03-15 DIAGNOSIS — C50211 Malignant neoplasm of upper-inner quadrant of right female breast: Secondary | ICD-10-CM | POA: Diagnosis not present

## 2016-03-15 DIAGNOSIS — Z170 Estrogen receptor positive status [ER+]: Secondary | ICD-10-CM | POA: Diagnosis not present

## 2016-03-15 DIAGNOSIS — Z510 Encounter for antineoplastic radiation therapy: Secondary | ICD-10-CM | POA: Diagnosis not present

## 2016-03-15 NOTE — Progress Notes (Signed)
Name: Lori Greene   MRN: [id number]  Date:  03/15/2016  DOB: [birth year]  Status:outpatient    DIAGNOSIS: Breast cancer.  CONSENT VERIFIED: yes   SET UP: Patient is setup supine   IMMOBILIZATION:  The following immobilization was used:Custom Moldable Pillow, breast board.   NARRATIVE: [name] was brought to the [facility].  Identity was confirmed.  All relevant records and images related to the planned course of therapy were reviewed.  Then, the patient was positioned in a stable reproducible clinical set-up for radiation therapy.  Wires were placed to delineate the clinical extent of breast tissue. A wire was placed on the scar as well.  CT images were obtained.  An isocenter was placed. Skin markings were placed.  The CT images were loaded into the planning software where the target and avoidance structures were contoured.  The radiation prescription was entered and confirmed. The patient was discharged in stable condition and tolerated simulation well.    TREATMENT PLANNING NOTE:  Treatment planning then occurred. I have requested : MLC's, isodose plan, basic dose calculation  I personally designed and supervised the construction of 3 medically necessary complex treatment devices for the protection of critical normal structures including the lungs and contralateral breast as well as the immobilization device which is necessary for set up certainty.   3D simulation occurred. I requested and analyzed a dose volume histogram of the heart, lungs and lumpectomy cavity.

## 2016-03-15 NOTE — Progress Notes (Signed)
Radiation Oncology         [phone] ________________________________  Name: Lori Greene      MRN: [id number]          Date: 03/15/2016              DOB: 22-Aug-1941  Optical Surface Tracking Plan:  Since intensity modulated radiotherapy (IMRT) and 3D conformal radiation treatment methods are predicated on accurate and precise positioning for treatment, intrafraction motion monitoring is medically necessary to ensure accurate and safe treatment delivery.  The ability to quantify intrafraction motion without excessive ionizing radiation dose can only be performed with optical surface tracking. Accordingly, surface imaging offers the opportunity to obtain 3D measurements of patient position throughout IMRT and 3D treatments without excessive radiation exposure.  I am ordering optical surface tracking for this patient's upcoming course of radiotherapy. ________________________________ Signature   Reference:   Ursula Alert, J, et al. Surface imaging-based analysis of intrafraction motion for breast radiotherapy patients.Journal of [place], n. 6, nov. 2014. ISSN [id number].   Available at: <http://www.jacmp.org/index.php/jacmp/article/view/4957>.

## 2016-03-17 DIAGNOSIS — C50211 Malignant neoplasm of upper-inner quadrant of right female breast: Secondary | ICD-10-CM | POA: Diagnosis not present

## 2016-03-17 DIAGNOSIS — Z170 Estrogen receptor positive status [ER+]: Secondary | ICD-10-CM | POA: Diagnosis not present

## 2016-03-17 DIAGNOSIS — Z510 Encounter for antineoplastic radiation therapy: Secondary | ICD-10-CM | POA: Diagnosis not present

## 2016-03-18 DIAGNOSIS — Z170 Estrogen receptor positive status [ER+]: Secondary | ICD-10-CM | POA: Diagnosis not present

## 2016-03-18 DIAGNOSIS — C50211 Malignant neoplasm of upper-inner quadrant of right female breast: Secondary | ICD-10-CM | POA: Diagnosis not present

## 2016-03-18 DIAGNOSIS — Z510 Encounter for antineoplastic radiation therapy: Secondary | ICD-10-CM | POA: Diagnosis not present

## 2016-03-22 ENCOUNTER — Ambulatory Visit
Admission: RE | Admit: 2016-03-22 | Discharge: 2016-03-22 | Disposition: A | Discharge status: Home / Self Care | Payer: Medicare Other | Source: Ambulatory Visit | Attending: Radiation Oncology | Admitting: Radiation Oncology

## 2016-03-22 DIAGNOSIS — C50211 Malignant neoplasm of upper-inner quadrant of right female breast: Secondary | ICD-10-CM | POA: Diagnosis not present

## 2016-03-22 DIAGNOSIS — Z510 Encounter for antineoplastic radiation therapy: Secondary | ICD-10-CM | POA: Diagnosis not present

## 2016-03-22 DIAGNOSIS — Z170 Estrogen receptor positive status [ER+]: Secondary | ICD-10-CM | POA: Diagnosis not present

## 2016-03-23 ENCOUNTER — Ambulatory Visit
Admission: RE | Admit: 2016-03-23 | Discharge: 2016-03-23 | Disposition: A | Discharge status: Home / Self Care | Payer: Medicare Other | Source: Ambulatory Visit | Attending: Radiation Oncology | Admitting: Radiation Oncology

## 2016-03-23 DIAGNOSIS — Z510 Encounter for antineoplastic radiation therapy: Secondary | ICD-10-CM | POA: Diagnosis not present

## 2016-03-23 DIAGNOSIS — C50211 Malignant neoplasm of upper-inner quadrant of right female breast: Secondary | ICD-10-CM | POA: Diagnosis not present

## 2016-03-23 DIAGNOSIS — Z170 Estrogen receptor positive status [ER+]: Secondary | ICD-10-CM | POA: Diagnosis not present

## 2016-03-24 ENCOUNTER — Ambulatory Visit
Admission: RE | Admit: 2016-03-24 | Discharge: 2016-03-24 | Disposition: A | Discharge status: Home / Self Care | Payer: Medicare Other | Source: Ambulatory Visit | Attending: Radiation Oncology | Admitting: Radiation Oncology

## 2016-03-24 DIAGNOSIS — C50211 Malignant neoplasm of upper-inner quadrant of right female breast: Secondary | ICD-10-CM | POA: Diagnosis not present

## 2016-03-24 DIAGNOSIS — Z170 Estrogen receptor positive status [ER+]: Secondary | ICD-10-CM | POA: Diagnosis not present

## 2016-03-24 DIAGNOSIS — Z510 Encounter for antineoplastic radiation therapy: Secondary | ICD-10-CM | POA: Diagnosis not present

## 2016-03-25 ENCOUNTER — Ambulatory Visit
Admission: RE | Admit: 2016-03-25 | Discharge: 2016-03-25 | Disposition: A | Discharge status: Home / Self Care | Payer: Medicare Other | Source: Ambulatory Visit | Attending: Radiation Oncology | Admitting: Radiation Oncology

## 2016-03-25 DIAGNOSIS — Z510 Encounter for antineoplastic radiation therapy: Secondary | ICD-10-CM | POA: Diagnosis not present

## 2016-03-25 DIAGNOSIS — C50211 Malignant neoplasm of upper-inner quadrant of right female breast: Secondary | ICD-10-CM | POA: Diagnosis not present

## 2016-03-25 DIAGNOSIS — Z170 Estrogen receptor positive status [ER+]: Secondary | ICD-10-CM | POA: Diagnosis not present

## 2016-03-28 ENCOUNTER — Ambulatory Visit
Admission: RE | Admit: 2016-03-28 | Discharge: 2016-03-28 | Disposition: A | Discharge status: Home / Self Care | Payer: Medicare Other | Source: Ambulatory Visit | Attending: Radiation Oncology | Admitting: Radiation Oncology

## 2016-03-28 DIAGNOSIS — Z510 Encounter for antineoplastic radiation therapy: Secondary | ICD-10-CM | POA: Diagnosis not present

## 2016-03-28 DIAGNOSIS — C50211 Malignant neoplasm of upper-inner quadrant of right female breast: Secondary | ICD-10-CM | POA: Diagnosis not present

## 2016-03-28 DIAGNOSIS — Z170 Estrogen receptor positive status [ER+]: Secondary | ICD-10-CM | POA: Diagnosis not present

## 2016-03-28 MED ORDER — RADIAPLEXRX EX GEL
Freq: Once | CUTANEOUS | Status: AC
  Administered 2016-03-28: 17:00:00 via TOPICAL

## 2016-03-28 MED ORDER — ALRA NON-METALLIC DEODORANT (RAD-ONC)
1.0000 "application " | Freq: Once | TOPICAL | Status: AC
  Administered 2016-03-28: 1 via TOPICAL

## 2016-03-29 ENCOUNTER — Encounter: Payer: Self-pay | Admitting: Radiation Oncology

## 2016-03-29 ENCOUNTER — Ambulatory Visit
Admission: RE | Admit: 2016-03-29 | Discharge: 2016-03-29 | Disposition: A | Discharge status: Home / Self Care | Payer: Medicare Other | Source: Ambulatory Visit | Attending: Radiation Oncology | Admitting: Radiation Oncology

## 2016-03-29 VITALS — BP 169/74 | HR 68 | Temp 98.1°F | Ht 60.0 in | Wt 120.4 lb

## 2016-03-29 DIAGNOSIS — Z510 Encounter for antineoplastic radiation therapy: Secondary | ICD-10-CM | POA: Diagnosis not present

## 2016-03-29 DIAGNOSIS — C50211 Malignant neoplasm of upper-inner quadrant of right female breast: Secondary | ICD-10-CM

## 2016-03-29 DIAGNOSIS — Z170 Estrogen receptor positive status [ER+]: Secondary | ICD-10-CM | POA: Diagnosis not present

## 2016-03-29 NOTE — Progress Notes (Signed)
Weekly Management Note Current Dose: 9  Gy  Projected Dose: 45  Gy   Narrative:  The patient presents for routine under treatment assessment.  CBCT/MVCT images/Port film x-rays were reviewed.  The chart was checked.  Ms. Pla hs received 5 fractions to he right breast. Note mild erythema in the right areola region, otherwise, no visible changes in pigmentation of her right breast. Skin remains intact. Reports mild fatigue.  Physical Findings: Weight: 120 lb 6.4 oz (54.613 kg). Unchanged Filed Vitals:   03/29/16 1611  BP: 169/74  Pulse: 68  Temp: 98.1 F (36.7 C)   Impression:  The patient is tolerating radiation.  Plan:  Continue treatment as planned. Discussed survivorship and secondary cancers. Sister is having surgery on Thursday at [facility] and she may need to miss treatment Friday which is ok.   ------------------------------------------------  Thea Silversmith, MD  This document serves as a record of services personally performed by Thea Silversmith, MD. It was created on her behalf by Derek Mound, a trained medical scribe. The creation of this record is based on the scribe's personal observations and the provider's statements to them. This document has been checked and approved by the attending provider.

## 2016-03-29 NOTE — Progress Notes (Addendum)
Lori Greene hs received 5 fractions to he right breast. Note mild erythema in the right areola region, otherwise, no visible changes in pigmentation of her right breast.  Skin remains intact. Reports mild fatigue.

## 2016-03-30 ENCOUNTER — Ambulatory Visit
Admission: RE | Admit: 2016-03-30 | Discharge: 2016-03-30 | Disposition: A | Discharge status: Home / Self Care | Payer: Medicare Other | Source: Ambulatory Visit | Attending: Radiation Oncology | Admitting: Radiation Oncology

## 2016-03-30 DIAGNOSIS — Z510 Encounter for antineoplastic radiation therapy: Secondary | ICD-10-CM | POA: Diagnosis not present

## 2016-03-30 DIAGNOSIS — C50211 Malignant neoplasm of upper-inner quadrant of right female breast: Secondary | ICD-10-CM | POA: Diagnosis not present

## 2016-03-30 DIAGNOSIS — Z170 Estrogen receptor positive status [ER+]: Secondary | ICD-10-CM | POA: Diagnosis not present

## 2016-03-31 ENCOUNTER — Ambulatory Visit
Admission: RE | Admit: 2016-03-31 | Discharge: 2016-03-31 | Disposition: A | Discharge status: Home / Self Care | Payer: Medicare Other | Source: Ambulatory Visit | Attending: Radiation Oncology | Admitting: Radiation Oncology

## 2016-03-31 ENCOUNTER — Encounter (HOSPITAL_BASED_OUTPATIENT_CLINIC_OR_DEPARTMENT_OTHER): Payer: Medicare Other | Admitting: Hematology & Oncology

## 2016-03-31 ENCOUNTER — Encounter (HOSPITAL_COMMUNITY): Payer: Self-pay | Admitting: Hematology & Oncology

## 2016-03-31 ENCOUNTER — Encounter (HOSPITAL_COMMUNITY): Payer: Medicare Other | Attending: Hematology & Oncology

## 2016-03-31 VITALS — BP 180/69 | HR 72 | Temp 97.9°F | Resp 20 | Wt 114.8 lb

## 2016-03-31 DIAGNOSIS — R059 Cough, unspecified: Secondary | ICD-10-CM

## 2016-03-31 DIAGNOSIS — Z170 Estrogen receptor positive status [ER+]: Secondary | ICD-10-CM

## 2016-03-31 DIAGNOSIS — C50211 Malignant neoplasm of upper-inner quadrant of right female breast: Secondary | ICD-10-CM | POA: Insufficient documentation

## 2016-03-31 DIAGNOSIS — I10 Essential (primary) hypertension: Secondary | ICD-10-CM

## 2016-03-31 DIAGNOSIS — R05 Cough: Secondary | ICD-10-CM

## 2016-03-31 DIAGNOSIS — Z510 Encounter for antineoplastic radiation therapy: Secondary | ICD-10-CM | POA: Diagnosis not present

## 2016-03-31 DIAGNOSIS — Z5112 Encounter for antineoplastic immunotherapy: Secondary | ICD-10-CM | POA: Diagnosis present

## 2016-03-31 DIAGNOSIS — G620 Drug-induced polyneuropathy: Secondary | ICD-10-CM | POA: Diagnosis not present

## 2016-03-31 DIAGNOSIS — Z79899 Other long term (current) drug therapy: Secondary | ICD-10-CM

## 2016-03-31 MED ORDER — DIPHENHYDRAMINE HCL 25 MG PO CAPS
50.0000 mg | ORAL_CAPSULE | Freq: Once | ORAL | Status: DC
Start: 2016-03-31 — End: 2016-03-31

## 2016-03-31 MED ORDER — BECLOMETHASONE DIPROPIONATE 80 MCG/ACT IN AERS: 2.0000 | INHALATION_SPRAY | Freq: Two times a day (BID) | RESPIRATORY_TRACT | Status: DC

## 2016-03-31 MED ORDER — METHYLPREDNISOLONE 4 MG PO TBPK: ORAL_TABLET | ORAL | Status: DC

## 2016-03-31 MED ORDER — VALSARTAN 40 MG PO TABS: 40.0000 mg | ORAL_TABLET | Freq: Every day | ORAL | Status: DC

## 2016-03-31 MED ORDER — ACETAMINOPHEN 325 MG PO TABS: 650.0000 mg | ORAL_TABLET | Freq: Once | ORAL | Status: DC

## 2016-03-31 MED ORDER — SODIUM CHLORIDE 0.9% FLUSH: 10.0000 mL | INTRAVENOUS | Status: DC | PRN

## 2016-03-31 MED ORDER — SODIUM CHLORIDE 0.9 % IV SOLN
Freq: Once | INTRAVENOUS | Status: AC
  Administered 2016-03-31: 12:00:00 via INTRAVENOUS

## 2016-03-31 MED ORDER — HEPARIN SOD (PORK) LOCK FLUSH 100 UNIT/ML IV SOLN
500.0000 [IU] | Freq: Once | INTRAVENOUS | Status: AC | PRN
  Administered 2016-03-31: 500 [IU]
  Filled 2016-03-31: qty 5

## 2016-03-31 MED ORDER — HEPARIN SOD (PORK) LOCK FLUSH 100 UNIT/ML IV SOLN
INTRAVENOUS | Status: AC
  Filled 2016-03-31: qty 25

## 2016-03-31 MED ORDER — AZITHROMYCIN 250 MG PO TABS: ORAL_TABLET | ORAL | Status: DC

## 2016-03-31 MED ORDER — METFORMIN HCL ER (OSM) 500 MG PO TB24: 500.0000 mg | ORAL_TABLET | Freq: Every day | ORAL | Status: DC

## 2016-03-31 MED ORDER — SODIUM CHLORIDE 0.9 % IV SOLN
6.0000 mg/kg | Freq: Once | INTRAVENOUS | Status: AC
  Administered 2016-03-31: 315 mg via INTRAVENOUS
  Filled 2016-03-31: qty 15

## 2016-03-31 NOTE — Patient Instructions (Signed)
[place] at [facility] Discharge Instructions  RECOMMENDATIONS MADE BY THE CONSULTANT AND ANY TEST RESULTS WILL BE SENT TO YOUR REFERRING PHYSICIAN.  herceptin today Follow up as scheduled  Thank you for choosing [place] at [facility] to provide your oncology and hematology care.  To afford each patient quality time with our provider, please arrive at least 15 minutes before your scheduled appointment time.   Beginning January 23rd 2017 lab work for the [facility] will be done in the  Main lab at [facility] on 1st floor. If you have a lab appointment with the [place] please come in thru the  Main Entrance and check in at the main information desk  You need to re-schedule your appointment should you arrive 10 or more minutes late.  We strive to give you quality time with our providers, and arriving late affects you and other patients whose appointments are after yours.  Also, if you no show three or more times for appointments you may be dismissed from the clinic at the providers discretion.     Again, thank you for choosing [facility].  Our hope is that these requests will decrease the amount of time that you wait before being seen by our physicians.       _____________________________________________________________  Should you have questions after your visit to [facility], please contact our office at [phone] between the hours of 8:30 a.m. and 4:30 p.m.  Voicemails left after 4:30 p.m. will not be returned until the following business day.  For prescription refill requests, have your pharmacy contact our office.         Resources For Cancer Patients and their Caregivers ? American Cancer Society: Can assist with transportation, wigs, general needs, runs Look Good Feel Better.        2536687136 ? Cancer Care: Provides financial assistance, online support groups, medication/co-pay  assistance.  1-800-813-HOPE 6297018374) ? [place] Assists [place] cancer patients and their families through emotional , educational and financial support.  531-813-7264 ? [place] DSS Where to apply for food stamps, Medicaid and utility assistance. (972)714-9485 ? RCATS: Transportation to medical appointments. (309)825-0332 ? Social Security Administration: May apply for disability if have a Stage IV cancer. 978-081-4046 715-822-8807 ? [employer], Disability and Transit Services: Assists with nutrition, care and transit needs. [place] Support Programs: @10RELATIVEDAYS @ > Cancer Support Group  2nd Tuesday of the month 1pm-2pm, Journey Room  > Creative Journey  3rd Tuesday of the month 1130am-1pm, Journey Room  > Look Good Feel Better  1st Wednesday of the month 10am-12 noon, Journey Room (Call [place] to register [phone])

## 2016-03-31 NOTE — Progress Notes (Signed)
[name] at [facility] Progress Note  Patient Care Team: Lajean Manes, MD as PCP - General  CHIEF COMPLAINTS:  Invasive ductal carcinoma triple positive  ER+, PR+, HER 2 neu +  RIGHT breast cancer, upper inner quadrant Screening mammogram on 09/04/2015 with possible R breast mass 2 densities noted in the R breast upper inner quadrant within a centimeter of each other (9 and 10 cm from the nipple) 11/03/2015 partial mastectomy, sentinel node biopsy and port placement with Dr. Brantley Stage Final pathology [id number] ER+PR+ Her 2 neu+ R breast carcinoma  HISTORY OF PRESENTING ILLNESS:  Lori Greene 75 y.o. female is here for follow-up of  R breast cancer, disease is HER 2 neu positive. She has completed weekly taxol/herceptin. She is currently receiving XRT and continuing with Q3 week herceptin.  Lori Greene is unaccompanied. She is in a treatment chair.  Her BP is elevated and she notes it has been giving her problems. She does not want to increase her lisinopril because it exacerbates her cough.   Every time she has "the cough" it progresses and affects her breathing.She follows with Dr. Dillard Essex at [place]. 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   She denies any problems with XRT. She denies nausea, difficulties with appetite. Energy is slowly improving. She has hair regrowth.  MEDICAL HISTORY:  Past Medical History  Diagnosis Date  . Hypertension   . Diabetes mellitus without complication ([facility]   . Asthma   . GERD (gastroesophageal reflux disease)   . Cancer ([facility] 2016    right breast  . [facility] [place] [place]     Right breast    SURGICAL HISTORY: Past Surgical History  Procedure Laterality Date  . Abdominal hysterectomy  1983  . Nasal fracture surgery    . Breast lumpectomy with radioactive seed and sentinel lymph node biopsy Right 11/03/2015   Procedure: RIGHT BREAST LUMPECTOMY WITH RADIOACTIVE SEED AND SENTINEL LYMPH NODE MAPPING;  Surgeon: TErroll Luna MD;  [place] [facility]  Service: General;  Laterality: Right;  . Portacath placement Right 11/03/2015    Procedure: INSERTION PORT-A-CATH;  Surgeon: TErroll Luna MD;  [facility]  Service: General;  Laterality: Right;    SOCIAL HISTORY: Social History   Social History  . Marital Status: Married    Spouse Name: N/A  . Number of Children: N/A  . Years of Education: N/A   Occupational History  . retired    Social History Main Topics  . Smoking status: Never Smoker   . Smokeless tobacco: Never Used  . Alcohol Use: No  . Drug Use: No  . Sexual Activity: No   Other Topics Concern  . Not on file   Social History Narrative  Married 51 years. 0 children. Fostered children. 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 She likes to sew, knit, and quilt. She likes to walk but does not do it often. Non smoker ETOH, none [place]  FAMILY HISTORY: Family History  Problem Relation Age of Onset  . Breast cancer Mother   . Leukemia Father    has no family status information on file.  Father died at 575yo of leukemia Mother died of old age; breast cancer diagnosis at 74yo. She had a biopsy, no treatment. 1 sister,  healthy. No breast cancer.  ALLERGIES:  is allergic to lisinopril; losartan; and penicillins.  MEDICATIONS:  Current Outpatient Prescriptions  Medication Sig Dispense Refill  . albuterol (PROAIR HFA) 108 (90 BASE) MCG/ACT inhaler Inhale 2 puffs into the lungs every 6 (six) hours as needed for wheezing or shortness of breath.    Marland Kitchen atorvastatin (LIPITOR) 10 MG tablet Take 10 mg by mouth daily.    . beclomethasone (QVAR) 80 MCG/ACT inhaler Inhale 2 puffs into the lungs 2 (two) times daily.    . Calcium Carbonate-Vitamin D (CALCIUM  + D PO) Take 1 tablet by mouth every morning.    . Cholecalciferol (VITAMIN D3) 2000 units TABS Take by mouth.    . dextromethorphan-guaiFENesin (MUCINEX DM) 30-600 MG 12hr tablet Take 1 tablet by mouth 2 (two) times daily as needed. Reported on 01/28/2016    . famotidine (PEPCID) 20 MG tablet Take 20 mg by mouth 2 (two) times daily.    Marland Kitchen lidocaine-prilocaine (EMLA) cream Apply a quarter size amount to port site 1 hour prior to chemo. Do not rub in. Cover with plastic wrap. 30 g 3  . lisinopril (PRINIVIL,ZESTRIL) 5 MG tablet Take 5 mg by mouth daily.    . metFORMIN (GLUCOPHAGE) 500 MG tablet Take 1 tablet (500 mg total) by mouth daily with breakfast. 30 tablet 2  . montelukast (SINGULAIR) 10 MG tablet     . omeprazole (PRILOSEC) 40 MG capsule Take 40 mg by mouth daily.    . ondansetron (ZOFRAN) 8 MG tablet Take 1 tablet (8 mg total) by mouth every 8 (eight) hours as needed for nausea or vomiting. 30 tablet 2  . prochlorperazine (COMPAZINE) 10 MG tablet Take 1 tablet (10 mg total) by mouth every 6 (six) hours as needed for nausea or vomiting. 30 tablet 2  . Trastuzumab (HERCEPTIN IV) Inject into the vein. To start on November 26, 2015.     No current facility-administered medications for this visit.    Review of Systems  Constitutional: Negative for fever, chills and malaise/fatigue.  HENT: Negative.  Negative for congestion, hearing loss, nosebleeds, sore throat and tinnitus.   Eyes: Negative.  Negative for blurred vision, double vision, pain and discharge.  Respiratory: Positive for cough and shortness of breath. Negative for hemoptysis, sputum production and wheezing.        Cough which leads to breathing issues.  Cardiovascular: Negative.  Negative for chest pain, palpitations, claudication, leg swelling and PND.  Gastrointestinal: Negative for heartburn, nausea, abdominal pain, diarrhea, constipation, blood in stool and melena.  Genitourinary: Negative.  Negative for dysuria, urgency,  frequency and hematuria.  Musculoskeletal: Negative.  Negative for myalgias, joint pain and falls.  Skin: Negative.  Negative for itching and rash.  Neurological: Negative for dizziness, tingling, tremors, sensory change, speech change, focal weakness, seizures, loss of consciousness, weakness and headaches.  Endo/Heme/Allergies: Bruises/bleeds easily.       Bruising on arm  Psychiatric/Behavioral: Negative for depression, suicidal ideas, memory loss and substance abuse. The patient is not nervous/anxious.   All other systems reviewed and are negative.  14 point ROS was done and is otherwise as detailed above or in HPI  PHYSICAL EXAMINATION: ECOG PERFORMANCE STATUS: 0 - Asymptomatic   Vitals with BMI 03/31/2016  Height   Weight 114 lbs 13 oz  BMI   Systolic 086  Diastolic 83  Pulse 79  Respirations 20    Physical Exam  Constitutional: She is oriented to person, place, and time and well-developed, well-nourished, and  in no distress.  Wears glasses. Noted hair growth.  HENT:  Head: Normocephalic and atraumatic.  Nose: Nose normal.  Mouth/Throat: Oropharynx is clear and moist. No oropharyngeal exudate.  Eyes: Conjunctivae and EOM are normal. Pupils are equal, round, and reactive to light. Right eye exhibits no discharge. Left eye exhibits no discharge. No scleral icterus.  Neck: Normal range of motion. Neck supple. No tracheal deviation present. No thyromegaly present.  Cardiovascular: Normal rate, regular rhythm and normal heart sounds.  Exam reveals no gallop and no friction rub.   No murmur heard. Pulmonary/Chest: Effort normal. She has no wheezes. She has no rales.  Coarse rhonchi scattered throughout.  Abdominal: Soft. Bowel sounds are normal. She exhibits no distension and no mass. There is no tenderness. There is no rebound and no guarding.  Musculoskeletal: Normal range of motion. She exhibits no edema.  Lymphadenopathy:    She has no cervical adenopathy.  Neurological:  She is alert and oriented to person, place, and time. She has normal reflexes. No cranial nerve deficit. Gait normal. Coordination normal.  Skin: Skin is warm and dry. No rash noted.  Psychiatric: Mood, memory, affect and judgment normal.  Nursing note and vitals reviewed.   LABORATORY DATA:  I have reviewed the data as listed Lab Results  Component Value Date   WBC 7.4 03/10/2016   HGB 11.0* 03/10/2016   HCT 33.8* 03/10/2016   MCV 94.2 03/10/2016   PLT 254 03/10/2016   CMP     Component Value Date/Time   NA 140 03/10/2016 0850   K 3.6 03/10/2016 0850   CL 107 03/10/2016 0850   CO2 24 03/10/2016 0850   GLUCOSE 166* 03/10/2016 0850   BUN 10 03/10/2016 0850   CREATININE 0.91 03/10/2016 0850   CALCIUM 9.0 03/10/2016 0850   PROT 6.0* 03/10/2016 0850   ALBUMIN 3.6 03/10/2016 0850   AST 21 03/10/2016 0850   ALT 13* 03/10/2016 0850   ALKPHOS 102 03/10/2016 0850   BILITOT 0.9 03/10/2016 0850   GFRNONAA >60 03/10/2016 0850   GFRAA >60 03/10/2016 0850     RADIOGRAPHIC STUDIES: I have personally reviewed the radiological images as listed and agreed with the findings in the report.   Study Result     CLINICAL DATA: Breast cancer, on Herceptin therapy  EXAM: NUCLEAR MEDICINE CARDIAC BLOOD POOL IMAGING (MUGA)  TECHNIQUE: Cardiac multi-gated acquisition was performed at rest following intravenous injection of Tc-40mlabeled red blood cells.  RADIOPHARMACEUTICALS: 22.5 mCi Tc-957mn-vitro labeled autologous red blood cells IV  COMPARISON: 11/19/2015  FINDINGS: LEFT ventricular ejection fraction is calculated at 65%, slightly decreased 72% on the previous exam.  Cine analysis of the LEFT ventricle demonstrates normal LV wall motion.  IMPRESSION: Normal LEFT ventricular ejection fraction 65% slightly decreased from 72% on the previous exam.  Normal LV wall motion.   Electronically Signed  By: MaLavonia Dana.D.  On: 02/15/2016 14:17    PATHOLOGY:   ASSESSMENT & PLAN:  Invasive ductal carcinoma triple positive  ER+ (100%), PR+ (10%), HER 2 neu +  RIGHT breast cancer, upper inner quadrant Screening mammogram on 09/04/2015 with possible R breast mass 2 densities noted in the R breast upper inner quadrant within a centimeter of each other (9 and 10 cm from the nipple) 11/03/2015 partial mastectomy, sentinel node biopsy and port placement with Dr. CoBrantley Stageinal pathology [id number] ER+PR+ Her 2 neu+ R breast carcinoma Grade I chemotherapy induced neuropathy feet  Cough Hypertension  Lori Greene is here today for  ongoing therapy with herceptin. When we first met she had cough and coarse BS. I have called in a medrol dose pak. However I am going to repeat her MUGA prior to her next herceptin as well.  I have called her in diovan and advised her to d/c lisinopril. If she has difficulties with diovan she will have to try another class of anti-hypertensives.  She requested a refill of her Qvar as well. [facility]   She will return for follow up in 3 weeks with ongoing herceptin.  Endocrine therapy will be initiated at the completion of XRT.  All questions were answered. The patient knows to call the clinic with any problems, questions or concerns.  This note was electronically signed.   This document serves as a record of services personally performed by Ancil Linsey, MD. It was created on her behalf by Arlyce Harman, a trained medical scribe. The creation of this record is based on the scribe's personal observations and the provider's statements to them. This document has been checked and approved by the attending provider.  I have reviewed the above documentation for accuracy and completeness, and I agree with the above. Molli Hazard, MD  03/31/2016 11:10 AM

## 2016-03-31 NOTE — Progress Notes (Signed)
Lori Greene Tolerated chemotherapy well today

## 2016-03-31 NOTE — Patient Instructions (Signed)
[place] at [facility] Discharge Instructions  RECOMMENDATIONS MADE BY THE CONSULTANT AND ANY TEST RESULTS WILL BE SENT TO YOUR REFERRING PHYSICIAN.   Dr. Whitney Muse called in 5 meds: Medrol Dose Pak, Z Pak, Diovan, QVAR, Metformin ER  Stop Lisinopril  Diovan is your new BP medication.  You need a MUGA   Return in 3 weeks  Needs CT of chest in November    Thank you for choosing [place] at [facility] to provide your oncology and hematology care.  To afford each patient quality time with our provider, please arrive at least 15 minutes before your scheduled appointment time.   Beginning January 23rd 2017 lab work for the [facility] will be done in the  Main lab at [facility] on 1st floor. If you have a lab appointment with the [place] please come in thru the  Main Entrance and check in at the main information desk  You need to re-schedule your appointment should you arrive 10 or more minutes late.  We strive to give you quality time with our providers, and arriving late affects you and other patients whose appointments are after yours.  Also, if you no show three or more times for appointments you may be dismissed from the clinic at the providers discretion.     Again, thank you for choosing [facility].  Our hope is that these requests will decrease the amount of time that you wait before being seen by our physicians.       _____________________________________________________________  Should you have questions after your visit to [facility], please contact our office at [phone] between the hours of 8:30 a.m. and 4:30 p.m.  Voicemails left after 4:30 p.m. will not be returned until the following business day.  For prescription refill requests, have your pharmacy contact our office.         Resources For Cancer Patients and their Caregivers ? American Cancer Society: Can assist with  transportation, wigs, general needs, runs Look Good Feel Better.        515-847-1083 ? Cancer Care: Provides financial assistance, online support groups, medication/co-pay assistance.  1-800-813-HOPE 867-481-0483) ? [place] Assists [place] cancer patients and their families through emotional , educational and financial support.  367-646-1284 ? [place] DSS Where to apply for food stamps, Medicaid and utility assistance. 269-158-5720 ? RCATS: Transportation to medical appointments. 217-296-1198 ? Social Security Administration: May apply for disability if have a Stage IV cancer. 940-694-0961 716-223-3316 ? [employer], Disability and Transit Services: Assists with nutrition, care and transit needs. [place] Support Programs: @10RELATIVEDAYS @ > Cancer Support Group  2nd Tuesday of the month 1pm-2pm, Journey Room  > Creative Journey  3rd Tuesday of the month 1130am-1pm, Journey Room  > Look Good Feel Better  1st Wednesday of the month 10am-12 noon, Journey Room (Call [place] to register [phone])

## 2016-04-01 ENCOUNTER — Ambulatory Visit
Admission: RE | Admit: 2016-04-01 | Discharge: 2016-04-01 | Disposition: A | Discharge status: Home / Self Care | Payer: Medicare Other | Source: Ambulatory Visit | Attending: Radiation Oncology | Admitting: Radiation Oncology

## 2016-04-01 DIAGNOSIS — Z170 Estrogen receptor positive status [ER+]: Secondary | ICD-10-CM | POA: Diagnosis not present

## 2016-04-01 DIAGNOSIS — Z510 Encounter for antineoplastic radiation therapy: Secondary | ICD-10-CM | POA: Diagnosis not present

## 2016-04-01 DIAGNOSIS — C50211 Malignant neoplasm of upper-inner quadrant of right female breast: Secondary | ICD-10-CM | POA: Diagnosis not present

## 2016-04-04 ENCOUNTER — Ambulatory Visit
Admission: RE | Admit: 2016-04-04 | Discharge: 2016-04-04 | Disposition: A | Discharge status: Home / Self Care | Payer: Medicare Other | Source: Ambulatory Visit | Attending: Radiation Oncology | Admitting: Radiation Oncology

## 2016-04-04 DIAGNOSIS — Z170 Estrogen receptor positive status [ER+]: Secondary | ICD-10-CM | POA: Diagnosis not present

## 2016-04-04 DIAGNOSIS — C50211 Malignant neoplasm of upper-inner quadrant of right female breast: Secondary | ICD-10-CM | POA: Diagnosis not present

## 2016-04-04 DIAGNOSIS — Z510 Encounter for antineoplastic radiation therapy: Secondary | ICD-10-CM | POA: Diagnosis not present

## 2016-04-05 ENCOUNTER — Ambulatory Visit
Admission: RE | Admit: 2016-04-05 | Discharge: 2016-04-05 | Disposition: A | Discharge status: Home / Self Care | Payer: Medicare Other | Source: Ambulatory Visit | Attending: Radiation Oncology | Admitting: Radiation Oncology

## 2016-04-05 ENCOUNTER — Encounter: Payer: Self-pay | Admitting: Radiation Oncology

## 2016-04-05 VITALS — BP 184/73 | HR 68 | Temp 97.6°F | Resp 16 | Ht <= 58 in | Wt 112.8 lb

## 2016-04-05 DIAGNOSIS — C50211 Malignant neoplasm of upper-inner quadrant of right female breast: Secondary | ICD-10-CM

## 2016-04-05 DIAGNOSIS — Z510 Encounter for antineoplastic radiation therapy: Secondary | ICD-10-CM | POA: Diagnosis not present

## 2016-04-05 DIAGNOSIS — Z170 Estrogen receptor positive status [ER+]: Secondary | ICD-10-CM | POA: Diagnosis not present

## 2016-04-05 NOTE — Progress Notes (Addendum)
Lori Greene has received 10 fractions to her right breast.  Skin to right breast slight redness to right upper chest.  Using Radiaplex gel.  Appetite is good.  Having fatigue some day.  Denies pain. BP 184/73 mmHg  Pulse 68  Temp(Src) 97.6 F (36.4 C) (Oral)  Resp 16  Ht 5" (0.127 m)  Wt 112 lb 12.8 oz (51.166 kg)  BMI 3172.30 kg/m2  SpO2 98%

## 2016-04-05 NOTE — Progress Notes (Signed)
Weekly Management Note Current Dose: 18 Gy  Projected Dose: 61 Gy   Narrative:  The patient presents for routine under treatment assessment.  CBCT/MVCT images/Port film x-rays were reviewed.  The chart was checked.  Lori Greene has received 10 fractions to her right breast. Skin to right breast slight redness to right upper chest. Using Radiaplex gel. Appetite is good. Having fatigue some day. Denies pain.  Physical Findings: Weight: 112 lb 12.8 oz (51.166 kg). Unchanged Filed Vitals:   04/05/16 1542 04/05/16 1550  BP: 191/67 184/73  Pulse: 68   Temp: 97.6 F (36.4 C)   Resp: 16    Impression:  The patient is tolerating radiation.  Plan:  Continue treatment as planned.  ------------------------------------------------  Thea Silversmith, MD    This document serves as a record of services personally performed by Thea Silversmith, MD. It was created on her behalf by  Lendon Collar, a trained medical scribe. The creation of this record is based on the scribe's personal observations and the provider's statements to them. This document has been checked and approved by the attending provider.

## 2016-04-06 ENCOUNTER — Ambulatory Visit
Admission: RE | Admit: 2016-04-06 | Discharge: 2016-04-06 | Disposition: A | Discharge status: Home / Self Care | Payer: Medicare Other | Source: Ambulatory Visit | Attending: Radiation Oncology | Admitting: Radiation Oncology

## 2016-04-06 DIAGNOSIS — C50211 Malignant neoplasm of upper-inner quadrant of right female breast: Secondary | ICD-10-CM | POA: Diagnosis not present

## 2016-04-06 DIAGNOSIS — Z510 Encounter for antineoplastic radiation therapy: Secondary | ICD-10-CM | POA: Diagnosis not present

## 2016-04-06 DIAGNOSIS — Z170 Estrogen receptor positive status [ER+]: Secondary | ICD-10-CM | POA: Diagnosis not present

## 2016-04-07 ENCOUNTER — Ambulatory Visit
Admission: RE | Admit: 2016-04-07 | Discharge: 2016-04-07 | Disposition: A | Discharge status: Home / Self Care | Payer: Medicare Other | Source: Ambulatory Visit | Attending: Radiation Oncology | Admitting: Radiation Oncology

## 2016-04-07 DIAGNOSIS — Z170 Estrogen receptor positive status [ER+]: Secondary | ICD-10-CM | POA: Diagnosis not present

## 2016-04-07 DIAGNOSIS — Z510 Encounter for antineoplastic radiation therapy: Secondary | ICD-10-CM | POA: Diagnosis not present

## 2016-04-07 DIAGNOSIS — C50211 Malignant neoplasm of upper-inner quadrant of right female breast: Secondary | ICD-10-CM | POA: Diagnosis not present

## 2016-04-08 ENCOUNTER — Ambulatory Visit
Admission: RE | Admit: 2016-04-08 | Discharge: 2016-04-08 | Disposition: A | Discharge status: Home / Self Care | Payer: Medicare Other | Source: Ambulatory Visit | Attending: Radiation Oncology | Admitting: Radiation Oncology

## 2016-04-08 DIAGNOSIS — Z510 Encounter for antineoplastic radiation therapy: Secondary | ICD-10-CM | POA: Diagnosis not present

## 2016-04-08 DIAGNOSIS — Z170 Estrogen receptor positive status [ER+]: Secondary | ICD-10-CM | POA: Diagnosis not present

## 2016-04-08 DIAGNOSIS — C50211 Malignant neoplasm of upper-inner quadrant of right female breast: Secondary | ICD-10-CM | POA: Diagnosis not present

## 2016-04-12 ENCOUNTER — Ambulatory Visit
Admission: RE | Admit: 2016-04-12 | Discharge: 2016-04-12 | Disposition: A | Discharge status: Home / Self Care | Payer: Medicare Other | Source: Ambulatory Visit | Attending: Radiation Oncology | Admitting: Radiation Oncology

## 2016-04-12 ENCOUNTER — Encounter: Payer: Self-pay | Admitting: Radiation Oncology

## 2016-04-12 VITALS — BP 161/68 | HR 78 | Temp 97.7°F | Resp 16 | Ht <= 58 in | Wt 114.1 lb

## 2016-04-12 DIAGNOSIS — Z510 Encounter for antineoplastic radiation therapy: Secondary | ICD-10-CM | POA: Diagnosis not present

## 2016-04-12 DIAGNOSIS — C50211 Malignant neoplasm of upper-inner quadrant of right female breast: Secondary | ICD-10-CM | POA: Diagnosis not present

## 2016-04-12 DIAGNOSIS — Z170 Estrogen receptor positive status [ER+]: Secondary | ICD-10-CM | POA: Diagnosis not present

## 2016-04-12 NOTE — Progress Notes (Signed)
Mrs. Shiff has received 14 fractions to her right breast.  Skin to right breast with slight redness.  Using Radiaplex gel bid to tid.  Appetite is good.  Having some fatigue if she does any type of work.  Denies pain. BP 161/68 mmHg  Pulse 78  Temp(Src) 97.7 F (36.5 C) (Oral)  Resp 16  Ht 5" (0.127 m)  Wt 114 lb 1.6 oz (51.755 kg)  BMI 3208.82 kg/m2  SpO2 100%

## 2016-04-12 NOTE — Progress Notes (Signed)
Weekly Management Note Current Dose: 25.2 Gy  Projected Dose: 61 Gy   Narrative:  The patient presents for routine under treatment assessment.  CBCT/MVCT images/Port film x-rays were reviewed.  The chart was checked.  Mrs. Alderete has received 14 fractions to her right breast. She is using Radiaplex gel bid to the treatment area as directed. Appetite is good. Having some fatigue if she does any type of work. Denies pain.  Physical Findings: Weight: 114 lb 1.6 oz (51.755 kg). Slightly dark right breast. Filed Vitals:   04/12/16 1540  BP: 161/68  Pulse: 78  Temp: 97.7 F (36.5 C)  Resp: 16   Impression:  The patient is tolerating radiation.  Plan:  Continue treatment as planned. Continue radiaplex.  ------------------------------------------------  Thea Silversmith, MD    This document serves as a record of services personally performed by Thea Silversmith, MD. It was created on her behalf by  Lendon Collar, a trained medical scribe. The creation of this record is based on the scribe's personal observations and the provider's statements to them. This document has been checked and approved by the attending provider.

## 2016-04-13 ENCOUNTER — Ambulatory Visit
Admission: RE | Admit: 2016-04-13 | Discharge: 2016-04-13 | Disposition: A | Discharge status: Home / Self Care | Payer: Medicare Other | Source: Ambulatory Visit | Attending: Radiation Oncology | Admitting: Radiation Oncology

## 2016-04-13 DIAGNOSIS — Z170 Estrogen receptor positive status [ER+]: Secondary | ICD-10-CM | POA: Diagnosis not present

## 2016-04-13 DIAGNOSIS — C50211 Malignant neoplasm of upper-inner quadrant of right female breast: Secondary | ICD-10-CM | POA: Diagnosis not present

## 2016-04-13 DIAGNOSIS — Z510 Encounter for antineoplastic radiation therapy: Secondary | ICD-10-CM | POA: Diagnosis not present

## 2016-04-14 ENCOUNTER — Ambulatory Visit
Admission: RE | Admit: 2016-04-14 | Discharge: 2016-04-14 | Disposition: A | Discharge status: Home / Self Care | Payer: Medicare Other | Source: Ambulatory Visit | Attending: Radiation Oncology | Admitting: Radiation Oncology

## 2016-04-14 DIAGNOSIS — C50211 Malignant neoplasm of upper-inner quadrant of right female breast: Secondary | ICD-10-CM | POA: Diagnosis not present

## 2016-04-14 DIAGNOSIS — Z170 Estrogen receptor positive status [ER+]: Secondary | ICD-10-CM | POA: Diagnosis not present

## 2016-04-14 DIAGNOSIS — Z510 Encounter for antineoplastic radiation therapy: Secondary | ICD-10-CM | POA: Diagnosis not present

## 2016-04-15 ENCOUNTER — Ambulatory Visit
Admission: RE | Admit: 2016-04-15 | Discharge: 2016-04-15 | Disposition: A | Discharge status: Home / Self Care | Payer: Medicare Other | Source: Ambulatory Visit | Attending: Radiation Oncology | Admitting: Radiation Oncology

## 2016-04-15 DIAGNOSIS — C50211 Malignant neoplasm of upper-inner quadrant of right female breast: Secondary | ICD-10-CM | POA: Diagnosis not present

## 2016-04-15 DIAGNOSIS — Z170 Estrogen receptor positive status [ER+]: Secondary | ICD-10-CM | POA: Diagnosis not present

## 2016-04-15 DIAGNOSIS — Z510 Encounter for antineoplastic radiation therapy: Secondary | ICD-10-CM | POA: Diagnosis not present

## 2016-04-18 ENCOUNTER — Encounter (HOSPITAL_COMMUNITY)
Admission: RE | Admit: 2016-04-18 | Discharge: 2016-04-18 | Disposition: A | Discharge status: Home / Self Care | Payer: Medicare Other | Source: Ambulatory Visit | Attending: Hematology & Oncology | Admitting: Hematology & Oncology

## 2016-04-18 ENCOUNTER — Encounter: Payer: Self-pay | Admitting: Radiation Oncology

## 2016-04-18 ENCOUNTER — Ambulatory Visit
Admission: RE | Admit: 2016-04-18 | Discharge: 2016-04-18 | Disposition: A | Discharge status: Home / Self Care | Payer: Medicare Other | Source: Ambulatory Visit | Attending: Radiation Oncology | Admitting: Radiation Oncology

## 2016-04-18 ENCOUNTER — Encounter (HOSPITAL_COMMUNITY): Payer: Self-pay

## 2016-04-18 VITALS — BP 162/64 | HR 74 | Temp 98.3°F | Resp 16 | Ht <= 58 in | Wt 116.5 lb

## 2016-04-18 DIAGNOSIS — C50211 Malignant neoplasm of upper-inner quadrant of right female breast: Secondary | ICD-10-CM | POA: Diagnosis not present

## 2016-04-18 DIAGNOSIS — R05 Cough: Secondary | ICD-10-CM | POA: Insufficient documentation

## 2016-04-18 DIAGNOSIS — Z79899 Other long term (current) drug therapy: Secondary | ICD-10-CM | POA: Insufficient documentation

## 2016-04-18 DIAGNOSIS — Z510 Encounter for antineoplastic radiation therapy: Secondary | ICD-10-CM | POA: Diagnosis not present

## 2016-04-18 DIAGNOSIS — C50919 Malignant neoplasm of unspecified site of unspecified female breast: Secondary | ICD-10-CM | POA: Diagnosis not present

## 2016-04-18 DIAGNOSIS — Z170 Estrogen receptor positive status [ER+]: Secondary | ICD-10-CM | POA: Diagnosis not present

## 2016-04-18 MED ORDER — RADIAPLEXRX EX GEL
Freq: Once | CUTANEOUS | Status: AC
  Administered 2016-04-18: 16:00:00 via TOPICAL

## 2016-04-18 MED ORDER — HEPARIN SOD (PORK) LOCK FLUSH 100 UNIT/ML IV SOLN
INTRAVENOUS | Status: AC
  Filled 2016-04-18: qty 5

## 2016-04-18 MED ORDER — TECHNETIUM TC 99M-LABELED RED BLOOD CELLS IV KIT
25.0000 | PACK | Freq: Once | INTRAVENOUS | Status: AC | PRN
  Administered 2016-04-18: 24.8 via INTRAVENOUS

## 2016-04-18 NOTE — Progress Notes (Signed)
   Weekly Management Note:  outpatient    ICD-9-CM ICD-10-CM   1. Breast cancer of upper-inner quadrant of right female breast (HCC) 174.2 C50.211 hyaluronate sodium (RADIAPLEXRX) gel    Current Dose:  32.4 Gy  Projected Dose: 61 Gy   Narrative:  The patient presents for routine under treatment assessment.  CBCT/MVCT images/Port film x-rays were reviewed.  The chart was checked. Doing well.  Physical Findings:  height is 5" (0.127 m) and weight is 116 lb 8 oz (52.844 kg). Her oral temperature is 98.3 F (36.8 C). Her blood pressure is 162/64 and her pulse is 74. Her respiration is 16 and oxygen saturation is 100%.   Wt Readings from Last 3 Encounters:  04/18/16 116 lb 8 oz (52.844 kg)  04/12/16 114 lb 1.6 oz (51.755 kg)  04/05/16 112 lb 12.8 oz (51.166 kg)   Hyperpigmentation over right breast, skin intact  Impression:  The patient is tolerating radiotherapy.  Plan:  Continue radiotherapy as planned. Patient instructed to apply Radiplex to intact skin in treatment fields.      ________________________________   Eppie Gibson, M.D.

## 2016-04-18 NOTE — Progress Notes (Signed)
Lori Greene has received 18 fractions to her right breast.  Right breast with slight tanning.  Using Radiaplex gel bid to tid.  Appetite is good.  Having fatigue in the evening.  Fatigue lasted all day yesterday.  Denies pain. BP 162/64 mmHg  Pulse 74  Temp(Src) 98.3 F (36.8 C) (Oral)  Resp 16  Ht 5" (0.127 m)  Wt 116 lb 8 oz (52.844 kg)  BMI 3276.33 kg/m2  SpO2 100%

## 2016-04-19 ENCOUNTER — Ambulatory Visit
Admission: RE | Admit: 2016-04-19 | Discharge: 2016-04-19 | Disposition: A | Discharge status: Home / Self Care | Payer: Medicare Other | Source: Ambulatory Visit | Attending: Radiation Oncology | Admitting: Radiation Oncology

## 2016-04-19 ENCOUNTER — Encounter: Payer: Self-pay | Admitting: Radiation Oncology

## 2016-04-19 DIAGNOSIS — Z510 Encounter for antineoplastic radiation therapy: Secondary | ICD-10-CM | POA: Diagnosis not present

## 2016-04-19 DIAGNOSIS — C50211 Malignant neoplasm of upper-inner quadrant of right female breast: Secondary | ICD-10-CM | POA: Diagnosis not present

## 2016-04-19 DIAGNOSIS — Z170 Estrogen receptor positive status [ER+]: Secondary | ICD-10-CM | POA: Diagnosis not present

## 2016-04-20 ENCOUNTER — Ambulatory Visit
Admission: RE | Admit: 2016-04-20 | Discharge: 2016-04-20 | Disposition: A | Discharge status: Home / Self Care | Payer: Medicare Other | Source: Ambulatory Visit | Attending: Radiation Oncology | Admitting: Radiation Oncology

## 2016-04-20 DIAGNOSIS — C50211 Malignant neoplasm of upper-inner quadrant of right female breast: Secondary | ICD-10-CM | POA: Diagnosis not present

## 2016-04-20 DIAGNOSIS — Z170 Estrogen receptor positive status [ER+]: Secondary | ICD-10-CM | POA: Diagnosis not present

## 2016-04-20 DIAGNOSIS — Z510 Encounter for antineoplastic radiation therapy: Secondary | ICD-10-CM | POA: Diagnosis not present

## 2016-04-21 ENCOUNTER — Encounter (HOSPITAL_COMMUNITY): Payer: Self-pay

## 2016-04-21 ENCOUNTER — Ambulatory Visit
Admission: RE | Admit: 2016-04-21 | Discharge: 2016-04-21 | Disposition: A | Discharge status: Home / Self Care | Payer: Medicare Other | Source: Ambulatory Visit | Attending: Radiation Oncology | Admitting: Radiation Oncology

## 2016-04-21 ENCOUNTER — Encounter (HOSPITAL_BASED_OUTPATIENT_CLINIC_OR_DEPARTMENT_OTHER): Payer: Medicare Other | Admitting: Hematology & Oncology

## 2016-04-21 ENCOUNTER — Encounter (HOSPITAL_COMMUNITY): Payer: Medicare Other | Attending: Hematology & Oncology

## 2016-04-21 ENCOUNTER — Encounter (HOSPITAL_COMMUNITY): Payer: Self-pay | Admitting: Lab

## 2016-04-21 VITALS — BP 185/71 | HR 67 | Temp 98.0°F | Resp 20

## 2016-04-21 VITALS — BP 187/68 | HR 70 | Temp 97.8°F | Resp 20 | Wt 115.8 lb

## 2016-04-21 DIAGNOSIS — Z170 Estrogen receptor positive status [ER+]: Secondary | ICD-10-CM

## 2016-04-21 DIAGNOSIS — I10 Essential (primary) hypertension: Secondary | ICD-10-CM | POA: Diagnosis not present

## 2016-04-21 DIAGNOSIS — Z5112 Encounter for antineoplastic immunotherapy: Secondary | ICD-10-CM

## 2016-04-21 DIAGNOSIS — C50211 Malignant neoplasm of upper-inner quadrant of right female breast: Secondary | ICD-10-CM

## 2016-04-21 DIAGNOSIS — R059 Cough, unspecified: Secondary | ICD-10-CM

## 2016-04-21 DIAGNOSIS — R05 Cough: Secondary | ICD-10-CM

## 2016-04-21 DIAGNOSIS — C50911 Malignant neoplasm of unspecified site of right female breast: Secondary | ICD-10-CM

## 2016-04-21 DIAGNOSIS — Z510 Encounter for antineoplastic radiation therapy: Secondary | ICD-10-CM | POA: Diagnosis not present

## 2016-04-21 DIAGNOSIS — G620 Drug-induced polyneuropathy: Secondary | ICD-10-CM

## 2016-04-21 DIAGNOSIS — Z79899 Other long term (current) drug therapy: Secondary | ICD-10-CM

## 2016-04-21 LAB — CBC WITH DIFFERENTIAL/PLATELET
BASOS ABS: 0.1 10*3/uL (ref 0.0–0.1)
Basophils Relative: 1 %
EOS ABS: 0.7 10*3/uL (ref 0.0–0.7)
EOS PCT: 9 %
HCT: 36.1 % (ref 36.0–46.0)
Hemoglobin: 11.9 g/dL — ABNORMAL LOW (ref 12.0–15.0)
LYMPHS PCT: 19 %
Lymphs Abs: 1.4 10*3/uL (ref 0.7–4.0)
MCH: 31.2 pg (ref 26.0–34.0)
MCHC: 33 g/dL (ref 30.0–36.0)
MCV: 94.5 fL (ref 78.0–100.0)
MONO ABS: 0.4 10*3/uL (ref 0.1–1.0)
Monocytes Relative: 5 %
Neutro Abs: 4.9 10*3/uL (ref 1.7–7.7)
Neutrophils Relative %: 66 %
PLATELETS: 218 10*3/uL (ref 150–400)
RBC: 3.82 MIL/uL — ABNORMAL LOW (ref 3.87–5.11)
RDW: 12.8 % (ref 11.5–15.5)
WBC: 7.4 10*3/uL (ref 4.0–10.5)

## 2016-04-21 LAB — COMPREHENSIVE METABOLIC PANEL
ALT: 14 U/L (ref 14–54)
AST: 21 U/L (ref 15–41)
Albumin: 3.8 g/dL (ref 3.5–5.0)
Alkaline Phosphatase: 102 U/L (ref 38–126)
Anion gap: 8 (ref 5–15)
BUN: 11 mg/dL (ref 6–20)
CHLORIDE: 106 mmol/L (ref 101–111)
CO2: 23 mmol/L (ref 22–32)
Calcium: 8.7 mg/dL — ABNORMAL LOW (ref 8.9–10.3)
Creatinine, Ser: 1 mg/dL (ref 0.44–1.00)
GFR, EST NON AFRICAN AMERICAN: 55 mL/min — AB (ref >60)
Glucose, Bld: 194 mg/dL — ABNORMAL HIGH (ref 65–99)
POTASSIUM: 3.6 mmol/L (ref 3.5–5.1)
SODIUM: 137 mmol/L (ref 135–145)
Total Bilirubin: 0.8 mg/dL (ref 0.3–1.2)
Total Protein: 6.4 g/dL — ABNORMAL LOW (ref 6.5–8.1)

## 2016-04-21 MED ORDER — SODIUM CHLORIDE 0.9 % IV SOLN
Freq: Once | INTRAVENOUS | Status: AC
  Administered 2016-04-21: 12:00:00 via INTRAVENOUS

## 2016-04-21 MED ORDER — TRASTUZUMAB CHEMO INJECTION 440 MG
6.0000 mg/kg | Freq: Once | INTRAVENOUS | Status: AC
  Administered 2016-04-21: 315 mg via INTRAVENOUS
  Filled 2016-04-21: qty 15

## 2016-04-21 MED ORDER — HEPARIN SOD (PORK) LOCK FLUSH 100 UNIT/ML IV SOLN
500.0000 [IU] | Freq: Once | INTRAVENOUS | Status: AC | PRN
  Administered 2016-04-21: 500 [IU]
  Filled 2016-04-21: qty 5

## 2016-04-21 MED ORDER — SODIUM CHLORIDE 0.9% FLUSH: 10.0000 mL | INTRAVENOUS | Status: DC | PRN

## 2016-04-21 NOTE — Patient Instructions (Signed)
[place] at [facility] Discharge Instructions  RECOMMENDATIONS MADE BY THE CONSULTANT AND ANY TEST RESULTS WILL BE SENT TO YOUR REFERRING PHYSICIAN.  Refer to Dr. Luan Pulling for bronchiectasis/chronic cough  Refer back to Dr. Felipa Eth @ [facility] for persistent Hypertension  Return to clinic in 3 weeks for Herceptin/labs    Thank you for choosing [place] at [facility] to provide your oncology and hematology care.  To afford each patient quality time with our provider, please arrive at least 15 minutes before your scheduled appointment time.   Beginning January 23rd 2017 lab work for the [facility] will be done in the  Main lab at [facility] on 1st floor. If you have a lab appointment with the [facility] please come in thru the  Main Entrance and check in at the main information desk  You need to re-schedule your appointment should you arrive 10 or more minutes late.  We strive to give you quality time with our providers, and arriving late affects you and other patients whose appointments are after yours.  Also, if you no show three or more times for appointments you may be dismissed from the clinic at the providers discretion.     Again, thank you for choosing [facility].  Our hope is that these requests will decrease the amount of time that you wait before being seen by our physicians.       _____________________________________________________________  Should you have questions after your visit to [facility], please contact our office at [phone] between the hours of 8:30 a.m. and 4:30 p.m.  Voicemails left after 4:30 p.m. will not be returned until the following business day.  For prescription refill requests, have your pharmacy contact our office.         Resources For Cancer Patients and their Caregivers ? American Cancer Society: Can assist with transportation, wigs, general needs, runs Look  Good Feel Better.        858-594-1725 ? Cancer Care: Provides financial assistance, online support groups, medication/co-pay assistance.  1-800-813-HOPE 6288634240) ? [place] Assists [place] cancer patients and their families through emotional , educational and financial support.  ([phone] [phone] ? [place] DSS Where to apply for food stamps, Medicaid and utility assistance. 641-246-9757 ? RCATS: Transportation to medical appointments. (416)202-3007 ? Social Security Administration: May apply for disability if have a Stage IV cancer. (463)040-1240 (640)203-9368 ? [employer], Disability and Transit Services: Assists with nutrition, care and transit needs. [place] Support Programs: @10RELATIVEDAYS @ > Cancer Support Group  2nd Tuesday of the month 1pm-2pm, Journey Room  > Creative Journey  3rd Tuesday of the month 1130am-1pm, Journey Room  > Look Good Feel Better  1st Wednesday of the month 10am-12 noon, Journey Room (Call [place] to register [phone])

## 2016-04-21 NOTE — Progress Notes (Signed)
Referral to Dr Luan Pulling  Records faxed on 6/8. Records faxed on 6/8

## 2016-04-21 NOTE — Progress Notes (Signed)
[place] at [facility] Progress Note  Patient Care Team: Lajean Manes, MD as PCP - General  CHIEF COMPLAINTS:  Invasive ductal carcinoma triple positive  ER+, PR+, HER 2 neu +  RIGHT breast cancer, upper inner quadrant Screening mammogram on 09/04/2015 with possible R breast mass 2 densities noted in the R breast upper inner quadrant within a centimeter of each other (9 and 10 cm from the nipple) 11/03/2015 partial mastectomy, sentinel node biopsy and port placement with Dr. Brantley Stage Final pathology [id number] ER+PR+ Her 2 neu+ R breast carcinoma  HISTORY OF PRESENTING ILLNESS:  Lori Greene 75 y.o. female is here for follow-up of  R breast cancer, disease is HER 2 neu positive. She has completed weekly taxol/herceptin. She is currently receiving XRT and continuing with Q3 week herceptin.  [name] returns to the [place] today unaccompanied. Her blood pressure is high today. She notes that she's taking her blood pressure medication every day, though it has been changed. She adds that, in the past, her blood pressure used to be good, but everything she was taking was affecting her kidneys, so her medication was switched. As is, she has persistent hypertension.  She notes that radiation is going well and that she finishes the last Monday in June, remarking "I think I've got 13 more treatments?"  Today, she does not need anything, but notes "I still have that cough." She never smoked, but has a history of asthma.  She went [place] the [place] for [place] weekend, because she didn't have radiation on Monday. She saw her sister and the rest of her family, noting that her sister is having some problems with colon cancer. Her sister will be finding out about treatment, prognosis, etc., soon. In general, she notes that the trip was good.   She denies SOB, nausea, vomiting, CP. No diarrhea or constipation. No headaches.   MEDICAL HISTORY:  Past Medical History  Diagnosis  Date  . Hypertension   . Diabetes mellitus without complication ([place])   . Asthma   . GERD (gastroesophageal reflux disease)   . Cancer [facility]) 2016    right breast  . Breast cancer ([place])     Right breast    SURGICAL HISTORY: Past Surgical History  Procedure Laterality Date  . Abdominal hysterectomy  1983  . Nasal fracture surgery    . Breast lumpectomy with radioactive seed and sentinel lymph node biopsy Right 11/03/2015    Procedure: RIGHT BREAST LUMPECTOMY WITH RADIOACTIVE SEED AND SENTINEL LYMPH NODE MAPPING;  Surgeon: Erroll Luna, MD;  Location: [facility];  Service: General;  Laterality: Right;  . Portacath placement Right 11/03/2015    Procedure: INSERTION PORT-A-CATH;  Surgeon: Erroll Luna, MD;  Location: [facility];  Service: General;  Laterality: Right;    SOCIAL HISTORY: Social History   Social History  . Marital Status: Married    Spouse Name: N/A  . Number of Children: N/A  . Years of Education: N/A   Occupational History  . retired    Social History Main Topics  . Smoking status: Never Smoker   . Smokeless tobacco: Never Used  . Alcohol Use: No  . Drug Use: No  . Sexual Activity: No   Other Topics Concern  . Not on file   Social History Narrative  Married 51 years. 0 children. Fostered children. 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 She likes to sew, knit, and quilt. She likes  to walk but does not do it often. Non smoker ETOH, none She is from [place] and moved to [place] when she got married.  FAMILY HISTORY: Family History  Problem Relation Age of Onset  . Breast cancer Mother   . Leukemia Father    has no family status information on file.  Father died at 34 yo of leukemia Mother died of old age; breast cancer diagnosis at 74 yo. She had a biopsy, no treatment. 1 sister, healthy. No breast cancer.  ALLERGIES:  is allergic to lisinopril; losartan; and  penicillins.  MEDICATIONS:  Current Outpatient Prescriptions  Medication Sig Dispense Refill  . albuterol (PROAIR HFA) 108 (90 BASE) MCG/ACT inhaler Inhale 2 puffs into the lungs every 6 (six) hours as needed for wheezing or shortness of breath.    Marland Kitchen atorvastatin (LIPITOR) 10 MG tablet Take 10 mg by mouth daily.    . beclomethasone (QVAR) 80 MCG/ACT inhaler Inhale 2 puffs into the lungs 2 (two) times daily. 1 Inhaler 12  . Calcium Carbonate-Vitamin D (CALCIUM + D PO) Take 1 tablet by mouth every morning.    . Cholecalciferol (VITAMIN D3) 2000 units TABS Take by mouth.    . dextromethorphan-guaiFENesin (MUCINEX DM) 30-600 MG 12hr tablet Take 1 tablet by mouth daily as needed. Reported on 04/18/2016    . famotidine (PEPCID) 20 MG tablet Take 20 mg by mouth daily.     Marland Kitchen lidocaine-prilocaine (EMLA) cream Apply a quarter size amount to port site 1 hour prior to chemo. Do not rub in. Cover with plastic wrap. 30 g 3  . metformin (FORTAMET) 500 MG (OSM) 24 hr tablet Take 1 tablet (500 mg total) by mouth daily with breakfast. 30 tablet 3  . montelukast (SINGULAIR) 10 MG tablet     . omeprazole (PRILOSEC) 40 MG capsule Take 40 mg by mouth daily.    . ondansetron (ZOFRAN) 8 MG tablet Take 1 tablet (8 mg total) by mouth every 8 (eight) hours as needed for nausea or vomiting. 30 tablet 2  . prochlorperazine (COMPAZINE) 10 MG tablet Take 1 tablet (10 mg total) by mouth every 6 (six) hours as needed for nausea or vomiting. 30 tablet 2  . Trastuzumab (HERCEPTIN IV) Inject into the vein. To start on November 26, 2015.    . valsartan (DIOVAN) 40 MG tablet Take 1 tablet (40 mg total) by mouth daily. 30 tablet 1   No current facility-administered medications for this visit.    Review of Systems  Constitutional: Negative for fever, chills and malaise/fatigue.  HENT: Negative.  Negative for congestion, hearing loss, nosebleeds, sore throat and tinnitus.   Eyes: Negative.  Negative for blurred vision, double  vision, pain and discharge.  Respiratory: Positive for cough and shortness of breath. Negative for hemoptysis, sputum production and wheezing.        Cough which leads to breathing issues.  Cardiovascular: Negative.  Negative for chest pain, palpitations, claudication, leg swelling and PND.  Gastrointestinal: Negative for heartburn, nausea, abdominal pain, diarrhea, constipation, blood in stool and melena.  Genitourinary: Negative.  Negative for dysuria, urgency, frequency and hematuria.  Musculoskeletal: Negative.  Negative for myalgias, joint pain and falls.  Skin: Negative.  Negative for itching and rash.  Neurological: Negative for dizziness, tingling, tremors, sensory change, speech change, focal weakness, seizures, loss of consciousness, weakness and headaches.  Endo/Heme/Allergies: Bruises/bleeds easily.       Bruising on arm  Psychiatric/Behavioral: Negative for depression, suicidal ideas, memory loss and substance abuse. The  patient is not nervous/anxious.   All other systems reviewed and are negative.  14 point ROS was done and is otherwise as detailed above or in HPI    PHYSICAL EXAMINATION: ECOG PERFORMANCE STATUS: 0 - Asymptomatic   Vitals - 1 value per visit [id number]  SYSTOLIC [id number]  DIASTOLIC 68  Pulse 70  Temperature 97.8  Respirations 20  Weight (lb) 115.8     Physical Exam  Constitutional: She is oriented to person, place, and time and well-developed, well-nourished, and in no distress.  Wears glasses. Noted hair growth.  HENT:  Head: Normocephalic and atraumatic.  Nose: Nose normal.  Mouth/Throat: Oropharynx is clear and moist. No oropharyngeal exudate.  Eyes: Conjunctivae and EOM are normal. Pupils are equal, round, and reactive to light. Right eye exhibits no discharge. Left eye exhibits no discharge. No scleral icterus.  Neck: Normal range of motion. Neck supple. No tracheal deviation present. No thyromegaly present.  Cardiovascular: Normal rate, regular  rhythm and normal heart sounds.  Exam reveals no gallop and no friction rub.   No murmur heard. Pulmonary/Chest: Effort normal. She has no wheezes. She has no rales.  Coarse rhonchi scattered throughout.  Abdominal: Soft. Bowel sounds are normal. She exhibits no distension and no mass. There is no tenderness. There is no rebound and no guarding.  Musculoskeletal: Normal range of motion. She exhibits no edema.  Lymphadenopathy:    She has no cervical adenopathy.  Neurological: She is alert and oriented to person, place, and time. She has normal reflexes. No cranial nerve deficit. Gait normal. Coordination normal.  Skin: Skin is warm and dry. No rash noted.  Psychiatric: Mood, memory, affect and judgment normal.  Nursing note and vitals reviewed.   LABORATORY DATA:  I have reviewed the data as listed Lab Results  Component Value Date   WBC 7.4 04/21/2016   HGB 11.9* 04/21/2016   HCT 36.1 04/21/2016   MCV 94.5 04/21/2016   PLT 218 04/21/2016   CMP     Component Value Date/Time   NA 137 04/21/2016 1130   K 3.6 04/21/2016 1130   CL 106 04/21/2016 1130   CO2 23 04/21/2016 1130   GLUCOSE 194* 04/21/2016 1130   BUN 11 04/21/2016 1130   CREATININE 1.00 04/21/2016 1130   CALCIUM 8.7* 04/21/2016 1130   PROT 6.4* 04/21/2016 1130   ALBUMIN 3.8 04/21/2016 1130   AST 21 04/21/2016 1130   ALT 14 04/21/2016 1130   ALKPHOS 102 04/21/2016 1130   BILITOT 0.8 04/21/2016 1130   GFRNONAA 55* 04/21/2016 1130   GFRAA >60 04/21/2016 1130    RADIOGRAPHIC STUDIES: I have personally reviewed the radiological images as listed and agreed with the findings in the report.  CLINICAL DATA: History of breast cancer.  EXAM: NUCLEAR MEDICINE CARDIAC BLOOD POOL IMAGING (MUGA)  TECHNIQUE: Cardiac multi-gated acquisition was performed at rest following intravenous injection of Tc-60m labeled red blood cells.  RADIOPHARMACEUTICALS: 24.8 mCi Tc-40m MDP in-vitro labeled red blood cells  IV  COMPARISON: 02/15/2016  FINDINGS: The left ventricular ejection fraction equals 70.1%. On the previous exam this was equal to 65.2%. Normal left ventricular wall motion.  IMPRESSION: 1. Normal left ventricular ejection fraction equal to 70.1%. This has increased from 65.2% previously.   Electronically Signed  By: Kerby Moors M.D.  On: 04/18/2016 11:55   PATHOLOGY:   ASSESSMENT & PLAN:  Invasive ductal carcinoma triple positive  ER+ (100%), PR+ (10%), HER 2 neu +  RIGHT breast cancer, upper inner quadrant Screening mammogram  on 09/04/2015 with possible R breast mass 2 densities noted in the R breast upper inner quadrant within a centimeter of each other (9 and 10 cm from the nipple) 11/03/2015 partial mastectomy, sentinel node biopsy and port placement with Dr. Brantley Stage Final pathology [id number] ER+PR+ Her 2 neu+ R breast carcinoma Grade I chemotherapy induced neuropathy feet  Cough Hypertension   She will need to return and follow up about her persistent hypertension with her PCP, Dr. Felipa Eth, in [place]. He is with [facility]. We have referred her back to him today.   She is concerned about the pulmonary nodule noted on CT imaging last year. We will arrange for CT imaging in November.  She is concerned about her ongoing pulmonary issues. She notes that her lungs were clear and cough was gone during chemotherapy while she was on steroids with weekly therapy. She would like to see Dr. Luan Pulling locally. I have made the referral.   We reviewed her MUGA. She is cleared for ongoing herceptin. Goal again is to complete 52 weeks. She will return in 3 weeks for PE and ongoing therapy.  All questions were answered. The patient knows to call the clinic with any problems, questions or concerns.  This note was electronically signed.   This document serves as a record of services personally performed by Ancil Linsey, MD. It was created on her behalf by Toni Amend, a trained medical scribe. The creation of this record is based on the scribe's personal observations and the provider's statements to them. This document has been checked and approved by the attending provider.  I have reviewed the above documentation for accuracy and completeness, and I agree with the above. Molli Hazard, MD  04/25/2016 6:43 PM

## 2016-04-21 NOTE — Progress Notes (Signed)
Lori Greene Tolerated chemotherapy well today Discharged ambulatory

## 2016-04-21 NOTE — Patient Instructions (Signed)
[place] at [facility] Discharge Instructions  RECOMMENDATIONS MADE BY THE CONSULTANT AND ANY TEST RESULTS WILL BE SENT TO YOUR REFERRING PHYSICIAN.  herceptin today herceptin every 3 weeks Follow up as scheduled Please call the clinic if you have any questions or concerns  Thank you for choosing [place] at [facility] to provide your oncology and hematology care.  To afford each patient quality time with our provider, please arrive at least 15 minutes before your scheduled appointment time.   Beginning January 23rd 2017 lab work for the [facility] will be done in the  Main lab at [facility] on 1st floor. If you have a lab appointment with the [place] please come in thru the  Main Entrance and check in at the main information desk  You need to re-schedule your appointment should you arrive 10 or more minutes late.  We strive to give you quality time with our providers, and arriving late affects you and other patients whose appointments are after yours.  Also, if you no show three or more times for appointments you may be dismissed from the clinic at the providers discretion.     Again, thank you for choosing [facility].  Our hope is that these requests will decrease the amount of time that you wait before being seen by our physicians.       _____________________________________________________________  Should you have questions after your visit to [facility], please contact our office at [phone] between the hours of 8:30 a.m. and 4:30 p.m.  Voicemails left after 4:30 p.m. will not be returned until the following business day.  For prescription refill requests, have your pharmacy contact our office.         Resources For Cancer Patients and their Caregivers ? American Cancer Society: Can assist with transportation, wigs, general needs, runs Look Good Feel Better.         ([phone] ? Cancer Care: Provides financial assistance, online support groups, medication/co-pay assistance.  1-800-813-HOPE 202-385-2554) ? [place] Assists [place] cancer patients and their families through emotional , educational and financial support.  402-859-2557 ? [place] DSS Where to apply for food stamps, Medicaid and utility assistance. 873-814-6636 ? RCATS: Transportation to medical appointments. 806-339-6930 ? Social Security Administration: May apply for disability if have a Stage IV cancer. 940-773-6003 513-548-1774 ? [employer], Disability and Transit Services: Assists with nutrition, care and transit needs. [place] Support Programs: @10RELATIVEDAYS @ > Cancer Support Group  2nd Tuesday of the month 1pm-2pm, Journey Room  > Creative Journey  3rd Tuesday of the month 1130am-1pm, Journey Room  > Look Good Feel Better  1st Wednesday of the month 10am-12 noon, Journey Room (Call [place] to register [phone])

## 2016-04-22 ENCOUNTER — Ambulatory Visit
Admission: RE | Admit: 2016-04-22 | Discharge: 2016-04-22 | Disposition: A | Discharge status: Home / Self Care | Payer: Medicare Other | Source: Ambulatory Visit | Attending: Radiation Oncology | Admitting: Radiation Oncology

## 2016-04-22 DIAGNOSIS — C50211 Malignant neoplasm of upper-inner quadrant of right female breast: Secondary | ICD-10-CM | POA: Diagnosis not present

## 2016-04-22 DIAGNOSIS — Z510 Encounter for antineoplastic radiation therapy: Secondary | ICD-10-CM | POA: Diagnosis not present

## 2016-04-22 DIAGNOSIS — Z170 Estrogen receptor positive status [ER+]: Secondary | ICD-10-CM | POA: Diagnosis not present

## 2016-04-25 ENCOUNTER — Other Ambulatory Visit (HOSPITAL_COMMUNITY): Payer: Self-pay | Admitting: Hematology & Oncology

## 2016-04-25 ENCOUNTER — Ambulatory Visit
Admission: RE | Admit: 2016-04-25 | Discharge: 2016-04-25 | Disposition: A | Discharge status: Home / Self Care | Payer: Medicare Other | Source: Ambulatory Visit | Attending: Radiation Oncology | Admitting: Radiation Oncology

## 2016-04-25 ENCOUNTER — Encounter (HOSPITAL_COMMUNITY): Payer: Self-pay | Admitting: Hematology & Oncology

## 2016-04-25 DIAGNOSIS — C50211 Malignant neoplasm of upper-inner quadrant of right female breast: Secondary | ICD-10-CM | POA: Diagnosis not present

## 2016-04-25 DIAGNOSIS — Z510 Encounter for antineoplastic radiation therapy: Secondary | ICD-10-CM | POA: Diagnosis not present

## 2016-04-25 DIAGNOSIS — Z170 Estrogen receptor positive status [ER+]: Secondary | ICD-10-CM | POA: Diagnosis not present

## 2016-04-26 ENCOUNTER — Ambulatory Visit: Admission: RE | Admit: 2016-04-26 | Payer: Medicare Other | Source: Ambulatory Visit | Admitting: Radiation Oncology

## 2016-04-26 ENCOUNTER — Ambulatory Visit
Admission: RE | Admit: 2016-04-26 | Discharge: 2016-04-26 | Disposition: A | Discharge status: Home / Self Care | Payer: Medicare Other | Source: Ambulatory Visit | Attending: Radiation Oncology | Admitting: Radiation Oncology

## 2016-04-26 DIAGNOSIS — Z510 Encounter for antineoplastic radiation therapy: Secondary | ICD-10-CM | POA: Diagnosis not present

## 2016-04-26 DIAGNOSIS — C50211 Malignant neoplasm of upper-inner quadrant of right female breast: Secondary | ICD-10-CM | POA: Diagnosis not present

## 2016-04-26 DIAGNOSIS — Z170 Estrogen receptor positive status [ER+]: Secondary | ICD-10-CM | POA: Diagnosis not present

## 2016-04-27 ENCOUNTER — Ambulatory Visit
Admission: RE | Admit: 2016-04-27 | Discharge: 2016-04-27 | Disposition: A | Discharge status: Home / Self Care | Payer: Medicare Other | Source: Ambulatory Visit | Attending: Radiation Oncology | Admitting: Radiation Oncology

## 2016-04-27 DIAGNOSIS — Z7984 Long term (current) use of oral hypoglycemic drugs: Secondary | ICD-10-CM | POA: Diagnosis not present

## 2016-04-27 DIAGNOSIS — E1121 Type 2 diabetes mellitus with diabetic nephropathy: Secondary | ICD-10-CM | POA: Diagnosis not present

## 2016-04-27 DIAGNOSIS — N183 Chronic kidney disease, stage 3 (moderate): Secondary | ICD-10-CM | POA: Diagnosis not present

## 2016-04-27 DIAGNOSIS — J45909 Unspecified asthma, uncomplicated: Secondary | ICD-10-CM | POA: Diagnosis not present

## 2016-04-27 DIAGNOSIS — Z170 Estrogen receptor positive status [ER+]: Secondary | ICD-10-CM | POA: Diagnosis not present

## 2016-04-27 DIAGNOSIS — Z510 Encounter for antineoplastic radiation therapy: Secondary | ICD-10-CM | POA: Diagnosis not present

## 2016-04-27 DIAGNOSIS — I129 Hypertensive chronic kidney disease with stage 1 through stage 4 chronic kidney disease, or unspecified chronic kidney disease: Secondary | ICD-10-CM | POA: Diagnosis not present

## 2016-04-27 DIAGNOSIS — C50211 Malignant neoplasm of upper-inner quadrant of right female breast: Secondary | ICD-10-CM | POA: Diagnosis not present

## 2016-04-28 ENCOUNTER — Ambulatory Visit
Admission: RE | Admit: 2016-04-28 | Discharge: 2016-04-28 | Disposition: A | Discharge status: Home / Self Care | Payer: Medicare Other | Source: Ambulatory Visit | Attending: Radiation Oncology | Admitting: Radiation Oncology

## 2016-04-28 DIAGNOSIS — Z510 Encounter for antineoplastic radiation therapy: Secondary | ICD-10-CM | POA: Diagnosis not present

## 2016-04-28 DIAGNOSIS — Z170 Estrogen receptor positive status [ER+]: Secondary | ICD-10-CM | POA: Diagnosis not present

## 2016-04-28 DIAGNOSIS — C50211 Malignant neoplasm of upper-inner quadrant of right female breast: Secondary | ICD-10-CM | POA: Diagnosis not present

## 2016-04-29 ENCOUNTER — Ambulatory Visit
Admission: RE | Admit: 2016-04-29 | Discharge: 2016-04-29 | Disposition: A | Discharge status: Home / Self Care | Payer: Medicare Other | Source: Ambulatory Visit | Attending: Radiation Oncology | Admitting: Radiation Oncology

## 2016-04-29 DIAGNOSIS — Z170 Estrogen receptor positive status [ER+]: Secondary | ICD-10-CM | POA: Diagnosis not present

## 2016-04-29 DIAGNOSIS — C50211 Malignant neoplasm of upper-inner quadrant of right female breast: Secondary | ICD-10-CM | POA: Diagnosis not present

## 2016-04-29 DIAGNOSIS — Z510 Encounter for antineoplastic radiation therapy: Secondary | ICD-10-CM | POA: Diagnosis not present

## 2016-05-02 ENCOUNTER — Ambulatory Visit
Admission: RE | Admit: 2016-05-02 | Discharge: 2016-05-02 | Disposition: A | Discharge status: Home / Self Care | Payer: Medicare Other | Source: Ambulatory Visit | Attending: Radiation Oncology | Admitting: Radiation Oncology

## 2016-05-02 DIAGNOSIS — Z510 Encounter for antineoplastic radiation therapy: Secondary | ICD-10-CM | POA: Diagnosis not present

## 2016-05-02 DIAGNOSIS — C50211 Malignant neoplasm of upper-inner quadrant of right female breast: Secondary | ICD-10-CM | POA: Diagnosis not present

## 2016-05-02 DIAGNOSIS — Z170 Estrogen receptor positive status [ER+]: Secondary | ICD-10-CM | POA: Diagnosis not present

## 2016-05-03 ENCOUNTER — Ambulatory Visit
Admission: RE | Admit: 2016-05-03 | Discharge: 2016-05-03 | Disposition: A | Discharge status: Home / Self Care | Payer: Medicare Other | Source: Ambulatory Visit | Attending: Radiation Oncology | Admitting: Radiation Oncology

## 2016-05-03 ENCOUNTER — Encounter: Payer: Self-pay | Admitting: Radiation Oncology

## 2016-05-03 VITALS — BP 179/66 | HR 70 | Temp 98.2°F | Resp 16 | Ht <= 58 in | Wt 115.5 lb

## 2016-05-03 DIAGNOSIS — Z510 Encounter for antineoplastic radiation therapy: Secondary | ICD-10-CM | POA: Insufficient documentation

## 2016-05-03 DIAGNOSIS — C50211 Malignant neoplasm of upper-inner quadrant of right female breast: Secondary | ICD-10-CM | POA: Insufficient documentation

## 2016-05-03 DIAGNOSIS — Z923 Personal history of irradiation: Secondary | ICD-10-CM | POA: Insufficient documentation

## 2016-05-03 MED ORDER — SONAFINE EX EMUL
1.0000 "application " | Freq: Two times a day (BID) | CUTANEOUS | Status: DC
  Administered 2016-05-03: 1 via TOPICAL

## 2016-05-03 NOTE — Progress Notes (Signed)
   Weekly Management Note:  outpatient    ICD-9-CM ICD-10-CM   1. Breast cancer of upper-inner quadrant of right female breast (HCC) 174.2 C50.211 SONAFINE emulsion 1 application    Current Dose:  53 Gy  Projected Dose: 61 Gy   Narrative:  The patient presents for routine under treatment assessment.  CBCT/MVCT images/Port film x-rays were reviewed.  The chart was checked.  Mrs. Lori Greene has received 29 fractions to her right breast. Skin to right breast with redness,itching and burning changed to Sonafine today. Instructed to use bid. Appetite is good. Having fatigue all during the day. EOT done.   Physical Findings:  height is 5" (0.127 m) and weight is 115 lb 8 oz (52.39 kg). Her oral temperature is 98.2 F (36.8 C). Her blood pressure is 179/66 and her pulse is 70. Her respiration is 16 and oxygen saturation is 98%.   Wt Readings from Last 3 Encounters:  05/03/16 115 lb 8 oz (52.39 kg)  04/21/16 115 lb 12.8 oz (52.527 kg)  04/18/16 116 lb 8 oz (52.844 kg)   Hyperpigmentation over right breast, mild erythema, skin intact.   Impression:  The patient is tolerating radiotherapy but is experiencing increased fatigue.   Plan:  Continue radiotherapy as planned. Patient using Sonafine. She completes treatment next Monday.  -----------------------------------  Blair Promise, PhD, MD  This document serves as a record of services personally performed by Gery Pray, MD. It was created on his behalf by Derek Mound, a trained medical scribe. The creation of this record is based on the scribe's personal observations and the provider's statements to them. This document has been checked and approved by the attending provider.

## 2016-05-03 NOTE — Progress Notes (Addendum)
Mrs. Lou has received 29 fractions to her right breast.  Skin to right breast with redness,itching and burning changed to Sonafine today.  Instructed to use bid.  Appetite is good.  Having fatigue all during the day.  EOT done BP 179/66 mmHg  Pulse 70  Temp(Src) 98.2 F (36.8 C) (Oral)  Resp 16  Ht 5" (0.127 m)  Wt 115 lb 8 oz (52.39 kg)  BMI 3248.19 kg/m2  SpO2 98%

## 2016-05-04 ENCOUNTER — Ambulatory Visit
Admission: RE | Admit: 2016-05-04 | Discharge: 2016-05-04 | Disposition: A | Discharge status: Home / Self Care | Payer: Medicare Other | Source: Ambulatory Visit | Attending: Radiation Oncology | Admitting: Radiation Oncology

## 2016-05-04 DIAGNOSIS — Z510 Encounter for antineoplastic radiation therapy: Secondary | ICD-10-CM | POA: Diagnosis not present

## 2016-05-04 DIAGNOSIS — C50211 Malignant neoplasm of upper-inner quadrant of right female breast: Secondary | ICD-10-CM | POA: Diagnosis not present

## 2016-05-05 ENCOUNTER — Ambulatory Visit
Admission: RE | Admit: 2016-05-05 | Discharge: 2016-05-05 | Disposition: A | Discharge status: Home / Self Care | Payer: Medicare Other | Source: Ambulatory Visit

## 2016-05-05 DIAGNOSIS — C50211 Malignant neoplasm of upper-inner quadrant of right female breast: Secondary | ICD-10-CM | POA: Diagnosis not present

## 2016-05-05 DIAGNOSIS — Z510 Encounter for antineoplastic radiation therapy: Secondary | ICD-10-CM | POA: Diagnosis not present

## 2016-05-06 ENCOUNTER — Ambulatory Visit
Admission: RE | Admit: 2016-05-06 | Discharge: 2016-05-06 | Disposition: A | Discharge status: Home / Self Care | Payer: Medicare Other | Source: Ambulatory Visit

## 2016-05-06 DIAGNOSIS — C50211 Malignant neoplasm of upper-inner quadrant of right female breast: Secondary | ICD-10-CM | POA: Diagnosis not present

## 2016-05-06 DIAGNOSIS — Z510 Encounter for antineoplastic radiation therapy: Secondary | ICD-10-CM | POA: Diagnosis not present

## 2016-05-09 ENCOUNTER — Encounter: Payer: Self-pay | Admitting: Radiation Oncology

## 2016-05-09 ENCOUNTER — Ambulatory Visit
Admission: RE | Admit: 2016-05-09 | Discharge: 2016-05-09 | Disposition: A | Discharge status: Home / Self Care | Payer: Medicare Other | Source: Ambulatory Visit

## 2016-05-09 ENCOUNTER — Ambulatory Visit
Admission: RE | Admit: 2016-05-09 | Discharge: 2016-05-09 | Disposition: A | Discharge status: Home / Self Care | Payer: Medicare Other | Source: Ambulatory Visit | Attending: Radiation Oncology | Admitting: Radiation Oncology

## 2016-05-09 VITALS — BP 164/68 | HR 75 | Temp 98.0°F | Resp 16 | Ht <= 58 in | Wt 114.9 lb

## 2016-05-09 DIAGNOSIS — C50211 Malignant neoplasm of upper-inner quadrant of right female breast: Secondary | ICD-10-CM

## 2016-05-09 DIAGNOSIS — Z510 Encounter for antineoplastic radiation therapy: Secondary | ICD-10-CM | POA: Diagnosis not present

## 2016-05-09 NOTE — Progress Notes (Signed)
Mrs. Stramel has received 33 fractions to her right breast. Skin to right breast with redness.  Applied cellaphane and a Tegaderm to marking site to protect the marking,when she took a shower. The skin came off in two area when she took the cellaphane and Tegaderm off.  Using Sonafine bid. Appetite is good. Having fatigue all during the day. EOT done 05/03/16. BP 164/68 mmHg  Pulse 75  Temp(Src) 98 F (36.7 C) (Oral)  Resp 16  Ht 5" (0.127 m)  Wt 114 lb 14.4 oz (52.118 kg)  BMI 3231.32 kg/m2  SpO2 98%

## 2016-05-09 NOTE — Progress Notes (Signed)
   Weekly Management Note:  outpatient    ICD-9-CM ICD-10-CM   1. Breast cancer of upper-inner quadrant of right female breast (HCC) 174.2 C50.211     Current Dose: 61 Gy  Projected Dose: 61 Gy   Narrative:  The patient presents for routine under treatment assessment.  CBCT/MVCT images/Port film x-rays were reviewed.  The chart was checked. Doing well.Skin to right breast with redness.  Applied cellophane and a Tegaderm to marking site to protect the marking,when she took a shower. The skin came off in two areas when she took the cellophane and Tegaderm off.  Using Sonafine bid. Appetite is good. Having fatigue all during the day.   Physical Findings:  height is 5" (0.127 m) and weight is 114 lb 14.4 oz (52.118 kg). Her oral temperature is 98 F (36.7 C). Her blood pressure is 164/68 and her pulse is 75. Her respiration is 16 and oxygen saturation is 98%.   Wt Readings from Last 3 Encounters:  05/09/16 114 lb 14.4 oz (52.118 kg)  05/03/16 115 lb 8 oz (52.39 kg)  04/21/16 115 lb 12.8 oz (52.527 kg)   Hyperpigmentation over right breast, skin erythematous in a patch in the UIQ with punctate moist desquamation  Impression:  The patient has toleratedradiotherapy.  Plan:  Continue radiotherapy as planned. Patient instructed to continue sonafine in treatment fields and neosporin in the peeling area.  Stop Tegaderm. F/u in 53mo      ________________________________   Eppie Gibson, M.D.

## 2016-05-12 ENCOUNTER — Encounter (HOSPITAL_COMMUNITY): Payer: Self-pay

## 2016-05-12 ENCOUNTER — Encounter (HOSPITAL_BASED_OUTPATIENT_CLINIC_OR_DEPARTMENT_OTHER): Payer: Medicare Other | Admitting: Hematology & Oncology

## 2016-05-12 ENCOUNTER — Inpatient Hospital Stay (HOSPITAL_COMMUNITY): Payer: Medicare Other

## 2016-05-12 ENCOUNTER — Encounter (HOSPITAL_BASED_OUTPATIENT_CLINIC_OR_DEPARTMENT_OTHER): Payer: Medicare Other

## 2016-05-12 VITALS — Wt 114.3 lb

## 2016-05-12 VITALS — BP 174/68 | HR 68 | Temp 97.8°F | Resp 20

## 2016-05-12 DIAGNOSIS — Z5112 Encounter for antineoplastic immunotherapy: Secondary | ICD-10-CM | POA: Diagnosis present

## 2016-05-12 DIAGNOSIS — I10 Essential (primary) hypertension: Secondary | ICD-10-CM

## 2016-05-12 DIAGNOSIS — C50211 Malignant neoplasm of upper-inner quadrant of right female breast: Secondary | ICD-10-CM

## 2016-05-12 DIAGNOSIS — C50911 Malignant neoplasm of unspecified site of right female breast: Secondary | ICD-10-CM

## 2016-05-12 DIAGNOSIS — G620 Drug-induced polyneuropathy: Secondary | ICD-10-CM | POA: Diagnosis not present

## 2016-05-12 DIAGNOSIS — Z170 Estrogen receptor positive status [ER+]: Secondary | ICD-10-CM | POA: Diagnosis not present

## 2016-05-12 DIAGNOSIS — R05 Cough: Secondary | ICD-10-CM | POA: Diagnosis not present

## 2016-05-12 DIAGNOSIS — Z79899 Other long term (current) drug therapy: Secondary | ICD-10-CM

## 2016-05-12 MED ORDER — TRASTUZUMAB CHEMO INJECTION 440 MG
6.0000 mg/kg | Freq: Once | INTRAVENOUS | Status: AC
  Administered 2016-05-12: 315 mg via INTRAVENOUS
  Filled 2016-05-12: qty 15

## 2016-05-12 MED ORDER — SODIUM CHLORIDE 0.9 % IV SOLN
Freq: Once | INTRAVENOUS | Status: AC
  Administered 2016-05-12: 12:00:00 via INTRAVENOUS

## 2016-05-12 MED ORDER — AZITHROMYCIN 250 MG PO TABS: ORAL_TABLET | ORAL | Status: DC

## 2016-05-12 MED ORDER — HEPARIN SOD (PORK) LOCK FLUSH 100 UNIT/ML IV SOLN
500.0000 [IU] | Freq: Once | INTRAVENOUS | Status: AC | PRN
  Administered 2016-05-12: 500 [IU]

## 2016-05-12 MED ORDER — METHYLPREDNISOLONE 4 MG PO TBPK: ORAL_TABLET | ORAL | Status: DC

## 2016-05-12 MED ORDER — SODIUM CHLORIDE 0.9% FLUSH: 10.0000 mL | INTRAVENOUS | Status: DC | PRN

## 2016-05-12 NOTE — Progress Notes (Signed)
Natasia R Alaimo Tolerated chemotherapy well today Discharged ambulatory

## 2016-05-12 NOTE — Progress Notes (Signed)
[place] at [facility] Progress Note  Patient Care Team: Lajean Manes, MD as PCP - General  CHIEF COMPLAINTS:  Invasive ductal carcinoma triple positive  ER+, PR+, HER 2 neu +  RIGHT breast cancer, upper inner quadrant Screening mammogram on 09/04/2015 with possible R breast mass 2 densities noted in the R breast upper inner quadrant within a centimeter of each other (9 and 10 cm from the nipple) 11/03/2015 partial mastectomy, sentinel node biopsy and port placement with Dr. Brantley Stage Final pathology [id number] ER+PR+ Her 2 neu+ R breast carcinoma    Breast cancer of upper-inner quadrant of right female breast ([place])   09/01/2015 Mammogram BI-RADS CATEGORY 0: Incomplete. Need additional imaging evaluation and/or prior mammograms for comparison.   09/15/2015 Imaging CT chest- Right lower lobe 4 mm solid pulmonary nodule. If the patient is at high risk for bronchogenic carcinoma, follow-up chest CT at 1 year is recommended.   09/16/2015 Breast US US showing a small hypoechoic irregular mass with hyperechoic rim in the 1 o'clock location of R breast 10 cm from the nipple. Mass measures 0.7 x 0.5 x 0.6 cm. 2nd mass is identified in the 1 o'clock location 9 cm from the nipple 0.7 x 0.7 x 0.6 cm   09/16/2015 Mammogram Two suspicious masses in the 1 o'clock location of the right breast warranting tissue diagnosis.  No evidence for adenopathy.   09/21/2015 Pathology Results Estrogen Receptor: 100%, POSITIVE, STRONG STAINING INTENSITY Progesterone Receptor: 10%, POSITIVE, MODERATE STAINING INTENSITY Proliferation Marker Ki67: 10%.  HER2 - **POSITIVE**   09/21/2015 Mammogram Appropriate positioning of the 2 biopsy marking clips in the upper-inner quadrant of the right breast at 1 o'clock.   09/21/2015 Initial Biopsy Breast, right, needle core biopsy, 1:00 o'clock, 9 CMFN - INVASIVE DUCTAL CARCINOMA. - DUCTAL CARCINOMA IN SITU. - SEE COMMENT. 2. Breast, right, needle core biopsy, 1:00 o'clock,  10 CMFN - INVASIVE DUCTAL CARCINOMA. - DUCTAL CARCINOMA IN SITU WITH    10/26/2015 Procedure Right chest porta cath placed with no adverse features.- Dr. Brantley Stage.   11/03/2015 Pathology Results MULTIFOCAL INVASIVE DUCTAL CARCINOMA.  INVASIVE TUMOR IS 1.0 MM FROM NEAREST MARGIN (INFERIOR).  HIGH GRADE DUCTAL CARCINOMA IN SITU WITH NECROSIS.  IN SITU CARCINOMA IS 0.1 MM FROM THE NEAREST MARGIN (INFERIOR MARGIN).   11/03/2015 Procedure Right lumpectomy by Dr. Brantley Stage.   11/17/2015 Initial Diagnosis Breast cancer of upper-inner quadrant of right female breast ([place])   11/21/2015 Imaging Normal left ventricular ejection fraction equal to 71.9%.   11/26/2015 -  Chemotherapy Paclitaxel weekly x 12    11/26/2015 -  Antibody Plan Herceptin weekly with Paclitaxel.   02/15/2016 Echocardiogram MUGA- Normal LEFT ventricular ejection fraction 65% slightly decreased from 72% on the previous exam.    04/18/2016 Imaging [facility]- Normal left ventricular ejection fraction equal to 70.1%. This has increased from 65.2% previously.     HISTORY OF PRESENTING ILLNESS:  Lori Greene 75 y.o. female is here for follow-up of  R breast cancer, disease is ER+, PR+,  HER 2 neu positive. She has completed weekly taxol/herceptin. She has completed. XRT and continuing with Q3 week herceptin.  She has no complaints today. Her sister's colon cancer is stage IV. This is a major source of focus for her right now. She will be trying to spend as much time with her as she can.   She continues to have pulmonary complaints. She has an upcoming appointment with Dr. Luan Pulling. Cough, sputum production. No fever.  She  needs to start endocrine therapy and would like to do that on return.    MEDICAL HISTORY:  Past Medical History  Diagnosis Date  . Hypertension   . Diabetes mellitus without complication ([place])   . Asthma   . GERD (gastroesophageal reflux disease)   . Cancer [facility]) 2016    right breast  . Breast cancer ([place])     Right breast     SURGICAL HISTORY: Past Surgical History  Procedure Laterality Date  . Abdominal hysterectomy  1983  . Nasal fracture surgery    . Breast lumpectomy with radioactive seed and sentinel lymph node biopsy Right 11/03/2015    Procedure: RIGHT BREAST LUMPECTOMY WITH RADIOACTIVE SEED AND SENTINEL LYMPH NODE MAPPING;  Surgeon: Erroll Luna, MD;  Location: [place];  Service: General;  Laterality: Right;  . Portacath placement Right 11/03/2015    Procedure: INSERTION PORT-A-CATH;  Surgeon: Erroll Luna, MD;  Location: [place];  Service: General;  Laterality: Right;    SOCIAL HISTORY: Social History   Social History  . Marital Status: Married    Spouse Name: N/A  . Number of Children: N/A  . Years of Education: N/A   Occupational History  . retired    Social History Main Topics  . Smoking status: Never Smoker   . Smokeless tobacco: Never Used  . Alcohol Use: No  . Drug Use: No  . Sexual Activity: No   Other Topics Concern  . Not on file   Social History Narrative  Married 51 years. 0 children. Fostered children. 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 She likes to sew, knit, and quilt. She likes to walk but does not do it often. Non smoker ETOH, none She is from [facility] and moved to [place] when she got married.  FAMILY HISTORY: Family History  Problem Relation Age of Onset  . Breast cancer Mother   . Leukemia Father    has no family status information on file.  Father died at 21 yo of leukemia Mother died of old age; breast cancer diagnosis at 74 yo. She had a biopsy, no treatment. 1 sister, healthy. No breast cancer.  ALLERGIES:  is allergic to lisinopril; losartan; and penicillins.  MEDICATIONS:  Current Outpatient Prescriptions  Medication Sig Dispense Refill  . albuterol (PROAIR HFA) 108 (90 BASE) MCG/ACT inhaler Inhale 2 puffs into the lungs every 6 (six) hours as needed for wheezing  or shortness of breath.    Marland Kitchen amLODipine (NORVASC) 2.5 MG tablet Take 2.5 mg by mouth daily.    Marland Kitchen atorvastatin (LIPITOR) 10 MG tablet Take 10 mg by mouth daily.    . beclomethasone (QVAR) 80 MCG/ACT inhaler Inhale 2 puffs into the lungs 2 (two) times daily. 1 Inhaler 12  . Calcium Carbonate-Vitamin D (CALCIUM + D PO) Take 1 tablet by mouth every morning.    . Cholecalciferol (VITAMIN D3) 2000 units TABS Take by mouth.    . dextromethorphan-guaiFENesin (MUCINEX DM) 30-600 MG 12hr tablet Take 1 tablet by mouth daily as needed. Reported on 04/18/2016    . famotidine (PEPCID) 20 MG tablet Take 20 mg by mouth daily.     Marland Kitchen lidocaine-prilocaine (EMLA) cream Apply a quarter size amount to port site 1 hour prior to chemo. Do not rub in. Cover with plastic wrap. 30 g 3  . metformin (FORTAMET) 500 MG (OSM) 24 hr tablet Take 1 tablet (500 mg total) by mouth daily with breakfast. 30 tablet 3  .  montelukast (SINGULAIR) 10 MG tablet     . omeprazole (PRILOSEC) 40 MG capsule Take 40 mg by mouth daily.    . ondansetron (ZOFRAN) 8 MG tablet Take 1 tablet (8 mg total) by mouth every 8 (eight) hours as needed for nausea or vomiting. (Patient not taking: Reported on 05/09/2016) 30 tablet 2  . prochlorperazine (COMPAZINE) 10 MG tablet Take 1 tablet (10 mg total) by mouth every 6 (six) hours as needed for nausea or vomiting. (Patient not taking: Reported on 05/09/2016) 30 tablet 2  . Trastuzumab (HERCEPTIN IV) Inject into the vein. To start on November 26, 2015.    . valsartan (DIOVAN) 40 MG tablet Take 1 tablet (40 mg total) by mouth daily. 30 tablet 1   No current facility-administered medications for this visit.    Review of Systems  Constitutional: Negative for fever, chills and malaise/fatigue.  HENT: Negative.  Negative for congestion, hearing loss, nosebleeds, sore throat and tinnitus.   Eyes: Negative.  Negative for blurred vision, double vision, pain and discharge.  Respiratory: Positive for cough and shortness  of breath. Negative for hemoptysis, sputum production and wheezing.        Cough which leads to breathing issues.  Cardiovascular: Negative.  Negative for chest pain, palpitations, claudication, leg swelling and PND.  Gastrointestinal: Negative for heartburn, nausea, abdominal pain, diarrhea, constipation, blood in stool and melena.  Genitourinary: Negative.  Negative for dysuria, urgency, frequency and hematuria.  Musculoskeletal: Negative.  Negative for myalgias, joint pain and falls.  Skin: Negative.  Negative for itching and rash.  Neurological: Negative for dizziness, tingling, tremors, sensory change, speech change, focal weakness, seizures, loss of consciousness, weakness and headaches.  Endo/Heme/Allergies: Bruises/bleeds easily.       Bruising on arm  Psychiatric/Behavioral: Negative for depression, suicidal ideas, memory loss and substance abuse. The patient is not nervous/anxious.   All other systems reviewed and are negative.  14 point ROS was done and is otherwise as detailed above or in HPI    PHYSICAL EXAMINATION: ECOG PERFORMANCE STATUS: 0 - Asymptomatic  Vitals - 1 value per visit 8/54/6270  SYSTOLIC 350  DIASTOLIC 68  Pulse 68  Temperature 97.8  Respirations 20    Physical Exam  Constitutional: She is oriented to person, place, and time and well-developed, well-nourished, and in no distress.  Wears glasses. Noted hair growth.  HENT:  Head: Normocephalic and atraumatic.  Nose: Nose normal.  Mouth/Throat: Oropharynx is clear and moist. No oropharyngeal exudate.  Eyes: Conjunctivae and EOM are normal. Pupils are equal, round, and reactive to light. Right eye exhibits no discharge. Left eye exhibits no discharge. No scleral icterus.  Neck: Normal range of motion. Neck supple. No tracheal deviation present. No thyromegaly present.  Cardiovascular: Normal rate, regular rhythm and normal heart sounds.  Exam reveals no gallop and no friction rub.   No murmur  heard. Pulmonary/Chest: Effort normal. She has no wheezes. She has no rales.  Coarse rhonchi scattered throughout.  Abdominal: Soft. Bowel sounds are normal. She exhibits no distension and no mass. There is no tenderness. There is no rebound and no guarding.  Musculoskeletal: Normal range of motion. She exhibits no edema.  Lymphadenopathy:    She has no cervical adenopathy.  Neurological: She is alert and oriented to person, place, and time. She has normal reflexes. No cranial nerve deficit. Gait normal. Coordination normal.  Skin: Skin is warm and dry. No rash noted.  Psychiatric: Mood, memory, affect and judgment normal.  Nursing note and  vitals reviewed.   LABORATORY DATA:  I have reviewed the data as listed Lab Results  Component Value Date   WBC 7.4 04/21/2016   HGB 11.9* 04/21/2016   HCT 36.1 04/21/2016   MCV 94.5 04/21/2016   PLT 218 04/21/2016   CMP     Component Value Date/Time   NA 137 04/21/2016 1130   K 3.6 04/21/2016 1130   CL 106 04/21/2016 1130   CO2 23 04/21/2016 1130   GLUCOSE 194* 04/21/2016 1130   BUN 11 04/21/2016 1130   CREATININE 1.00 04/21/2016 1130   CALCIUM 8.7* 04/21/2016 1130   PROT 6.4* 04/21/2016 1130   ALBUMIN 3.8 04/21/2016 1130   AST 21 04/21/2016 1130   ALT 14 04/21/2016 1130   ALKPHOS 102 04/21/2016 1130   BILITOT 0.8 04/21/2016 1130   GFRNONAA 55* 04/21/2016 1130   GFRAA >60 04/21/2016 1130    RADIOGRAPHIC STUDIES: I have personally reviewed the radiological images as listed and agreed with the findings in the report.  CLINICAL DATA: History of breast cancer.  EXAM: NUCLEAR MEDICINE CARDIAC BLOOD POOL IMAGING ([facility])  TECHNIQUE: Cardiac multi-gated acquisition was performed at rest following intravenous injection of Tc-46mlabeled red blood cells.  RADIOPHARMACEUTICALS: 24.8 mCi Tc-965mDP in-vitro labeled red blood cells IV  COMPARISON: 02/15/2016  FINDINGS: The left ventricular ejection fraction equals 70.1%. On  the previous exam this was equal to 65.2%. Normal left ventricular wall motion.  IMPRESSION: 1. Normal left ventricular ejection fraction equal to 70.1%. This has increased from 65.2% previously.   Electronically Signed  By: TaKerby Moors.D.  On: 04/18/2016 11:55   PATHOLOGY:   ASSESSMENT & PLAN:  Invasive ductal carcinoma triple positive  ER+ (100%), PR+ (10%), HER 2 neu +  RIGHT breast cancer, upper inner quadrant Screening mammogram on 09/04/2015 with possible R breast mass 2 densities noted in the R breast upper inner quadrant within a centimeter of each other (9 and 10 cm from the nipple) 11/03/2015 partial mastectomy, sentinel node biopsy and port placement with Dr. CoBrantley Stageinal pathology [id number] ER+PR+ Her 2 neu+ R breast carcinoma Grade I chemotherapy induced neuropathy feet  Cough Hypertension   I have given her a prescription for a Z-pak and medrol dose pak as she is traveling to her sister's for the next several weeks. She has an upcoming appointment with Dr. HaLuan PullingShe is concerned about the pulmonary nodule noted on CT imaging last year. We will arrange for CT imaging in November. She is concerned about her ongoing pulmonary issues. She notes that her lungs were clear and cough was gone during chemotherapy while she was on steroids with weekly therapy.    Goal again is to complete 52 weeks of herceptin. She will return in 3 weeks for PE and ongoing therapy. She needs to start AI therapy. She will need a DEXA as well She would like to wait until her next visit to discuss. We will therefore discuss endocrine therapy and initiate at follow-up.   She is following with her PCP in regards to her HTN.   All questions were answered. The patient knows to call the clinic with any problems, questions or concerns.  This note was electronically signed.   This document serves as a record of services personally performed by ShAncil LinseyMD. It was created on her  behalf by KaToni Amenda trained medical scribe. The creation of this record is based on the scribe's personal observations and the provider's statements to them. This document has  been checked and approved by the attending provider.  I have reviewed the above documentation for accuracy and completeness, and I agree with the above. Molli Hazard, MD  05/12/2016 8:33 AM

## 2016-05-12 NOTE — Patient Instructions (Signed)
[facility] at [facility] Discharge Instructions  RECOMMENDATIONS MADE BY THE CONSULTANT AND ANY TEST RESULTS WILL BE SENT TO YOUR REFERRING PHYSICIAN.  Exam done and seen today by Dr. Penland  Return to see the doctor in Please call the clinic if you have any questions or concerns  Thank you for choosing [facility] at [facility] to provide your oncology and hematology care.  To afford each patient quality time with our provider, please arrive at least 15 minutes before your scheduled appointment time.   Beginning January 23rd 2017 lab work for the Cancer Center will be done in the  Main lab at [place] on 1st floor. If you have a lab appointment with the Cancer Center please come in thru the  Main Entrance and check in at the main information desk  You need to re-schedule your appointment should you arrive 10 or more minutes late.  We strive to give you quality time with our providers, and arriving late affects you and other patients whose appointments are after yours.  Also, if you no show three or more times for appointments you may be dismissed from the clinic at the providers discretion.     Again, thank you for choosing [facility].  Our hope is that these requests will decrease the amount of time that you wait before being seen by our physicians.       _____________________________________________________________  Should you have questions after your visit to [facility], please contact our office at [phone] between the hours of 8:30 a.m. and 4:30 p.m.  Voicemails left after 4:30 p.m. will not be returned until the following business day.  For prescription refill requests, have your pharmacy contact our office.         Resources For Cancer Patients and their Caregivers ? American Cancer Society: Can assist with transportation, wigs, general needs, runs Look Good Feel Better.         1-888-227-6333 ? Cancer Care: Provides financial assistance, online support groups, medication/co-pay assistance.  1-800-813-HOPE (4673) ? [facility] Assists [place] cancer patients and their families through emotional , educational and financial support.  336-427-4357 ? [place] DSS Where to apply for food stamps, Medicaid and utility assistance. 336-342-1394 ? RCATS: Transportation to medical appointments. 336-347-2287 ? Social Security Administration: May apply for disability if have a Stage IV cancer. 336-342-7796 1-800-772-1213 ? [place] Aging, Disability and Transit Services: Assists with nutrition, care and transit needs. 336-349-2343  Cancer Center Support Programs: @[email or web address]@ > Cancer Support Group  2nd Tuesday of the month 1pm-2pm, Journey Room  > Creative Journey  3rd Tuesday of the month 1130am-1pm, Journey Room  > Look Good Feel Better  1st Wednesday of the month 10am-12 noon, Journey Room (Call American Cancer Society to register 1-800-395-5775)   

## 2016-05-12 NOTE — Patient Instructions (Addendum)
[facility] Discharge Instructions for Patients Receiving Chemotherapy  Return in 3 weeks for Herceptin and to see Dr. Whitney Muse   Beginning January 23rd 2017 lab work for the [facility] will be done in the  Main lab at [facility] on 1st floor. If you have a lab appointment with the [place] please come in thru the  Main Entrance and check in at the main information desk   Today you received the following chemotherapy agents herceptin Follow up as scheduled Please call the clinic if you have any questions or concerns   To help prevent nausea and vomiting after your treatment, we encourage you to take your nausea medication    If you develop nausea and vomiting, or diarrhea that is not controlled by your medication, call the clinic.  The clinic phone number is [phone]. Office hours are Monday-Friday 8:30am-5:00pm.  BELOW ARE SYMPTOMS THAT SHOULD BE REPORTED IMMEDIATELY:  *FEVER GREATER THAN 101.0 F  *CHILLS WITH OR WITHOUT FEVER  NAUSEA AND VOMITING THAT IS NOT CONTROLLED WITH YOUR NAUSEA MEDICATION  *UNUSUAL SHORTNESS OF BREATH  *UNUSUAL BRUISING OR BLEEDING  TENDERNESS IN MOUTH AND THROAT WITH OR WITHOUT PRESENCE OF ULCERS  *URINARY PROBLEMS  *BOWEL PROBLEMS  UNUSUAL RASH Items with * indicate a potential emergency and should be followed up as soon as possible. If you have an emergency after office hours please contact your primary care physician or go to the nearest emergency department.  Please call the clinic during office hours if you have any questions or concerns.   You may also contact the Patient Navigator at [phone] should you have any questions or need assistance in obtaining follow up care.      Resources For Cancer Patients and their Caregivers ? American Cancer Society: Can assist with transportation, wigs, general needs, runs Look Good Feel Better.        785-279-8145 ? Cancer Care: Provides financial  assistance, online support groups, medication/co-pay assistance.  1-800-813-HOPE 239 614 2461) ? [facility] Assists [place] cancer patients and their families through emotional , educational and financial support.  4757839638 ? [place] DSS Where to apply for food stamps, Medicaid and utility assistance. (438) 429-6741 ? RCATS: Transportation to medical appointments. 9708206856 ? Social Security Administration: May apply for disability if have a Stage IV cancer. 734-300-3919 3343763092 ? [employer], Disability and Transit Services: Assists with nutrition, care and transit needs. 503-690-8852

## 2016-05-12 NOTE — Progress Notes (Signed)
Pt tolerated Herceptin without any problems.

## 2016-05-19 ENCOUNTER — Other Ambulatory Visit (HOSPITAL_COMMUNITY): Payer: Self-pay | Admitting: Hematology & Oncology

## 2016-05-24 NOTE — Progress Notes (Signed)
  Radiation Oncology         [phone] ________________________________  Name: Lori Greene MRN: [id number]  Date: 05/09/2016  DOB: 11-26-1940  End of Treatment Note  Diagnosis:   76 y.o female with T1c, N0 Triple positive right breast cancer.     Indication for treatment:  Curative       Radiation treatment dates:   03/23/2016-05/09/2016  Site/dose:    1. The Right breast was treated to 45 Gy in 25 fractions at 1.8 Gy per fraction. 2. The Right breast was boosted to 16 Gy in 8 fractions at 2 Gy per fraction.   Beams/energy:    1. 3D // 6X 2. En face // 6 MeV, 9 MeV  Narrative: The patient tolerated radiation treatment relatively well.   She is experienced hyperpigmentation over the right breast, as well as a patch of erythematous skin in a patch in the UIQ with punctuate moist desquamation. She also experienced fatigue.   Plan: The patient has completed radiation treatment. The patient was instructed to continue sonafine in the treatment fields and neosporin in the peeling area. The patient will return to radiation oncology clinic for routine followup in one month. I advised them to call or return sooner if they have any questions or concerns related to their recovery or treatment.  -----------------------------------  Blair Promise, PhD, MD  This document serves as a record of services personally performed by Gery Pray, MD. It was created on his behalf by Arlyce Harman, a trained medical scribe. The creation of this record is based on the scribe's personal observations and the provider's statements to them. This document has been checked and approved by the attending provider.

## 2016-05-25 ENCOUNTER — Other Ambulatory Visit (HOSPITAL_COMMUNITY): Payer: Self-pay | Admitting: *Deleted

## 2016-05-29 ENCOUNTER — Encounter (HOSPITAL_COMMUNITY): Payer: Self-pay | Admitting: Hematology & Oncology

## 2016-06-02 ENCOUNTER — Encounter (HOSPITAL_COMMUNITY): Payer: Medicare Other | Attending: Hematology & Oncology

## 2016-06-02 ENCOUNTER — Encounter (HOSPITAL_COMMUNITY): Payer: Self-pay

## 2016-06-02 ENCOUNTER — Inpatient Hospital Stay (HOSPITAL_COMMUNITY): Payer: Medicare Other

## 2016-06-02 VITALS — BP 181/67 | HR 63 | Temp 97.3°F | Resp 18 | Wt 116.2 lb

## 2016-06-02 DIAGNOSIS — C50211 Malignant neoplasm of upper-inner quadrant of right female breast: Secondary | ICD-10-CM | POA: Insufficient documentation

## 2016-06-02 DIAGNOSIS — Z5111 Encounter for antineoplastic chemotherapy: Secondary | ICD-10-CM | POA: Diagnosis present

## 2016-06-02 LAB — CBC WITH DIFFERENTIAL/PLATELET
BASOS ABS: 0.1 10*3/uL (ref 0.0–0.1)
Basophils Relative: 1 %
EOS PCT: 9 %
Eosinophils Absolute: 0.6 10*3/uL (ref 0.0–0.7)
HCT: 37.5 % (ref 36.0–46.0)
Hemoglobin: 12.1 g/dL (ref 12.0–15.0)
LYMPHS PCT: 25 %
Lymphs Abs: 1.6 10*3/uL (ref 0.7–4.0)
MCH: 29.4 pg (ref 26.0–34.0)
MCHC: 32.3 g/dL (ref 30.0–36.0)
MCV: 91.2 fL (ref 78.0–100.0)
Monocytes Absolute: 0.5 10*3/uL (ref 0.1–1.0)
Monocytes Relative: 7 %
NEUTROS ABS: 3.8 10*3/uL (ref 1.7–7.7)
Neutrophils Relative %: 58 %
PLATELETS: 222 10*3/uL (ref 150–400)
RBC: 4.11 MIL/uL (ref 3.87–5.11)
RDW: 12.8 % (ref 11.5–15.5)
WBC: 6.5 10*3/uL (ref 4.0–10.5)

## 2016-06-02 LAB — COMPREHENSIVE METABOLIC PANEL
ALT: 21 U/L (ref 14–54)
AST: 27 U/L (ref 15–41)
Albumin: 3.7 g/dL (ref 3.5–5.0)
Alkaline Phosphatase: 111 U/L (ref 38–126)
Anion gap: 9 (ref 5–15)
BUN: 13 mg/dL (ref 6–20)
CHLORIDE: 104 mmol/L (ref 101–111)
CO2: 25 mmol/L (ref 22–32)
CREATININE: 0.89 mg/dL (ref 0.44–1.00)
Calcium: 8.5 mg/dL — ABNORMAL LOW (ref 8.9–10.3)
GFR calc non Af Amer: >60 mL/min (ref >60)
Glucose, Bld: 150 mg/dL — ABNORMAL HIGH (ref 65–99)
POTASSIUM: 3.6 mmol/L (ref 3.5–5.1)
SODIUM: 138 mmol/L (ref 135–145)
Total Bilirubin: 0.9 mg/dL (ref 0.3–1.2)
Total Protein: 6.5 g/dL (ref 6.5–8.1)

## 2016-06-02 MED ORDER — SODIUM CHLORIDE 0.9 % IV SOLN
Freq: Once | INTRAVENOUS | Status: AC
  Administered 2016-06-02: 11:00:00 via INTRAVENOUS

## 2016-06-02 MED ORDER — TRASTUZUMAB CHEMO INJECTION 440 MG
6.0000 mg/kg | Freq: Once | INTRAVENOUS | Status: AC
  Administered 2016-06-02: 315 mg via INTRAVENOUS
  Filled 2016-06-02: qty 15

## 2016-06-02 MED ORDER — HEPARIN SOD (PORK) LOCK FLUSH 100 UNIT/ML IV SOLN
500.0000 [IU] | Freq: Once | INTRAVENOUS | Status: AC | PRN
  Administered 2016-06-02: 500 [IU]
  Filled 2016-06-02: qty 5

## 2016-06-02 NOTE — Progress Notes (Signed)
Lori Greene tolerated chemo well.B/P 181/67 P 63 after Herceptin completed. Dr. Whitney Muse was made aware of this. No new orders obtained. Pt discharged ambulatory

## 2016-06-02 NOTE — Patient Instructions (Signed)
[facility] Discharge Instructions for Patients Receiving Chemotherapy   Beginning January 23rd 2017 lab work for the Cancer Center will be done in the  Main lab at [place] on 1st floor. If you have a lab appointment with the Cancer Center please come in thru the  Main Entrance and check in at the main information desk   Today you received the following chemotherapy agents Herceptin. Follow-up as scheduled. Call clinic for any questions or concerns  To help prevent nausea and vomiting after your treatment, we encourage you to take your nausea medication.   If you develop nausea and vomiting, or diarrhea that is not controlled by your medication, call the clinic.  The clinic phone number is [phone]. Office hours are Monday-Friday 8:30am-5:00pm.  BELOW ARE SYMPTOMS THAT SHOULD BE REPORTED IMMEDIATELY:  *FEVER GREATER THAN 101.0 F  *CHILLS WITH OR WITHOUT FEVER  NAUSEA AND VOMITING THAT IS NOT CONTROLLED WITH YOUR NAUSEA MEDICATION  *UNUSUAL SHORTNESS OF BREATH  *UNUSUAL BRUISING OR BLEEDING  TENDERNESS IN MOUTH AND THROAT WITH OR WITHOUT PRESENCE OF ULCERS  *URINARY PROBLEMS  *BOWEL PROBLEMS  UNUSUAL RASH Items with * indicate a potential emergency and should be followed up as soon as possible. If you have an emergency after office hours please contact your primary care physician or go to the nearest emergency department.  Please call the clinic during office hours if you have any questions or concerns.   You may also contact the Patient Navigator at [phone] should you have any questions or need assistance in obtaining follow up care.      Resources For Cancer Patients and their Caregivers ? American Cancer Society: Can assist with transportation, wigs, general needs, runs Look Good Feel Better.        1-888-227-6333 ? Cancer Care: Provides financial assistance, online support groups, medication/co-pay assistance.  1-800-813-HOPE  (4673) ? [facility] Assists [place] cancer patients and their families through emotional , educational and financial support.  336-427-4357 ? [place] DSS Where to apply for food stamps, Medicaid and utility assistance. 336-342-1394 ? RCATS: Transportation to medical appointments. 336-347-2287 ? Social Security Administration: May apply for disability if have a Stage IV cancer. 336-342-7796 1-800-772-1213 ? [place] Aging, Disability and Transit Services: Assists with nutrition, care and transit needs. 336-349-2343         

## 2016-06-03 ENCOUNTER — Other Ambulatory Visit (HOSPITAL_COMMUNITY): Payer: Self-pay | Admitting: Emergency Medicine

## 2016-06-03 DIAGNOSIS — Z853 Personal history of malignant neoplasm of breast: Secondary | ICD-10-CM | POA: Diagnosis not present

## 2016-06-03 MED ORDER — ANASTROZOLE 1 MG PO TABS: 1.0000 mg | ORAL_TABLET | Freq: Every day | ORAL | Status: DC

## 2016-06-03 NOTE — Progress Notes (Signed)
Called arimidex into the pharamcy notified pt, side effect explained to pt

## 2016-06-07 DIAGNOSIS — J479 Bronchiectasis, uncomplicated: Secondary | ICD-10-CM | POA: Diagnosis not present

## 2016-06-07 DIAGNOSIS — R911 Solitary pulmonary nodule: Secondary | ICD-10-CM | POA: Diagnosis not present

## 2016-06-07 DIAGNOSIS — C50911 Malignant neoplasm of unspecified site of right female breast: Secondary | ICD-10-CM | POA: Diagnosis not present

## 2016-06-13 ENCOUNTER — Encounter: Payer: Self-pay | Admitting: Oncology

## 2016-06-13 DIAGNOSIS — H6981 Other specified disorders of Eustachian tube, right ear: Secondary | ICD-10-CM | POA: Diagnosis not present

## 2016-06-13 DIAGNOSIS — N183 Chronic kidney disease, stage 3 (moderate): Secondary | ICD-10-CM | POA: Diagnosis not present

## 2016-06-13 DIAGNOSIS — E1121 Type 2 diabetes mellitus with diabetic nephropathy: Secondary | ICD-10-CM | POA: Diagnosis not present

## 2016-06-13 DIAGNOSIS — I129 Hypertensive chronic kidney disease with stage 1 through stage 4 chronic kidney disease, or unspecified chronic kidney disease: Secondary | ICD-10-CM | POA: Diagnosis not present

## 2016-06-13 DIAGNOSIS — Z7984 Long term (current) use of oral hypoglycemic drugs: Secondary | ICD-10-CM | POA: Diagnosis not present

## 2016-06-16 ENCOUNTER — Ambulatory Visit
Admission: RE | Admit: 2016-06-16 | Discharge: 2016-06-16 | Disposition: A | Discharge status: Home / Self Care | Payer: Medicare Other | Source: Ambulatory Visit | Attending: Radiation Oncology | Admitting: Radiation Oncology

## 2016-06-16 ENCOUNTER — Encounter: Payer: Self-pay | Admitting: Radiation Oncology

## 2016-06-16 DIAGNOSIS — C50211 Malignant neoplasm of upper-inner quadrant of right female breast: Secondary | ICD-10-CM | POA: Diagnosis not present

## 2016-06-16 DIAGNOSIS — Y842 Radiological procedure and radiotherapy as the cause of abnormal reaction of the patient, or of later complication, without mention of misadventure at the time of the procedure: Secondary | ICD-10-CM | POA: Diagnosis not present

## 2016-06-16 NOTE — Progress Notes (Signed)
Radiation Oncology         [phone] ________________________________  Name: Lori Greene MRN: [id number]  Date: 06/16/2016  DOB: 1941-09-03  Follow-Up Visit Note  CC: Mathews Argyle, MD  Patrici Ranks, MD    ICD-9-CM ICD-10-CM   1. 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 The Right breast was treated to 45 Gy in 25 fractions at 1.8 Gy per fraction. 2. The Right breast was boosted to 16 Gy in 8 fractions at 2 Gy per fraction.   Narrative:  The patient returns today for routine follow-up. She denies having pain.  She has had a productive cough with white sputum that she said is from bronchiectasis.  She has not starting taking Arimidex because she read it can worsen a cough.  She reports having slight fatigue. The skin on her right breast is intact.  ALLERGIES:  is allergic to lisinopril; losartan; and penicillins.  Meds: Current Outpatient Prescriptions  Medication Sig Dispense Refill  . albuterol (PROAIR HFA) 108 (90 BASE) MCG/ACT inhaler Inhale 2 puffs into the lungs every 6 (six) hours as needed for wheezing or shortness of breath.    Marland Kitchen amLODipine (NORVASC) 2.5 MG tablet Take 2.5 mg by mouth daily.    Marland Kitchen atorvastatin (LIPITOR) 10 MG tablet Take 10 mg by mouth daily.    . Calcium Carbonate-Vitamin D (CALCIUM + D PO) Take 1 tablet by mouth every morning.    . Cholecalciferol (VITAMIN D3) 2000 units TABS Take by mouth.    . dextromethorphan-guaiFENesin (MUCINEX DM) 30-600 MG 12hr tablet Take 1 tablet by mouth daily as needed. Reported on 04/18/2016    . famotidine (PEPCID) 20 MG tablet Take 20 mg by mouth daily.     Marland Kitchen lidocaine-prilocaine (EMLA) cream Apply a quarter size amount to port site 1 hour prior to chemo. Do not rub in. Cover with plastic wrap. 30 g 3  . metformin  (FORTAMET) 500 MG (OSM) 24 hr tablet Take 1 tablet (500 mg total) by mouth daily with breakfast. 30 tablet 3  . montelukast (SINGULAIR) 10 MG tablet     . omeprazole (PRILOSEC) 40 MG capsule Take 40 mg by mouth daily.    . Trastuzumab (HERCEPTIN IV) Inject into the vein. To start on November 26, 2015.    . valsartan (DIOVAN) 40 MG tablet TAKE 1 TABLET (40 MG TOTAL) BY MOUTH DAILY. 30 tablet 1  . anastrozole (ARIMIDEX) 1 MG tablet Take 1 tablet (1 mg total) by mouth daily. (Patient not taking: Reported on 06/16/2016) 30 tablet 3  . beclomethasone (QVAR) 80 MCG/ACT inhaler Inhale 2 puffs into the lungs 2 (two) times daily. (Patient not taking: Reported on 06/16/2016) 1 Inhaler 12  . BREO ELLIPTA 100-25 MCG/INH AEPB     . ondansetron (ZOFRAN) 8 MG tablet Take 1 tablet (8 mg total) by mouth every 8 (eight) hours as needed for nausea or vomiting. (Patient not taking: Reported on 05/09/2016) 30 tablet 2  . prochlorperazine (COMPAZINE) 10 MG tablet Take 1 tablet (10 mg total) by mouth every 6 (six) hours as needed for nausea or vomiting. (Patient not taking: Reported on 05/09/2016) 30 tablet 2   No current facility-administered medications for this encounter.     Physical Findings: The patient is in no acute distress. Patient is alert and oriented.  height is 5' (1.524 m) and weight is 116 lb 4.8 oz (52.8 kg). Her oral temperature is 97.9 F (36.6 C). Her blood pressure is 163/73 (abnormal) and her pulse is 68. Her oxygen saturation is 98%.  mild  Inspiratory wheezing from her bronchiectasis. Heart has regular rate and rhythm. No palpable cervical, supraclavicular, or axillary adenopathy.  Right breast: Her skin is well healed with mild hyperpigmentation changes. No dominant mass appreciated.  Lab Findings: Lab Results  Component Value Date   WBC 6.5 06/02/2016   HGB 12.1 06/02/2016   HCT 37.5 06/02/2016   MCV 91.2 06/02/2016   PLT 222 06/02/2016    Radiographic Findings: No results  found.  Impression:  The patient has recovered well from the effects of radiation with mild residual fatigue. No sign of recurrence on clinical exam.  Plan: PRN follow up with radiation oncology. She will continue follow up closely with Dr. Whitney Muse in light of her continuing Herceptin. ____________________________________ -----------------------------------  Blair Promise, PhD, MD  This document serves as a record of services personally performed by Gery Pray, MD. It was created on his behalf by Darcus Austin, a trained medical scribe. The creation of this record is based on the scribe's personal observations and the provider's statements to them. This document has been checked and approved by the attending provider.

## 2016-06-16 NOTE — Progress Notes (Signed)
Lori Greene here for follow up.  She denies having pain.  She has had a productive cough with white sputum that she said is from bronchiectasis.  She has not starting taking Arimidex because she read it can worsen a cough.  She reports having slight fatigue.  The skin on her right breast is intact.  BP (!) 163/73 (BP Location: Left Arm, Patient Position: Sitting)   Pulse 68   Temp 97.9 F (36.6 C) (Oral)   Ht 5' (1.524 m)   Wt 116 lb 4.8 oz (52.8 kg)   SpO2 98%   BMI 22.71 kg/m    Wt Readings from Last 3 Encounters:  06/16/16 116 lb 4.8 oz (52.8 kg)  06/02/16 116 lb 3.2 oz (52.7 kg)  05/12/16 114 lb 4.8 oz (51.8 kg)

## 2016-06-23 ENCOUNTER — Encounter (HOSPITAL_COMMUNITY): Payer: Self-pay | Admitting: Adult Health

## 2016-06-23 ENCOUNTER — Inpatient Hospital Stay (HOSPITAL_COMMUNITY): Payer: Medicare Other

## 2016-06-23 ENCOUNTER — Encounter (HOSPITAL_COMMUNITY): Payer: Medicare Other | Attending: Oncology

## 2016-06-23 ENCOUNTER — Encounter (HOSPITAL_COMMUNITY): Payer: Medicare Other | Attending: Adult Health | Admitting: Adult Health

## 2016-06-23 VITALS — BP 145/61 | HR 73 | Temp 98.2°F | Resp 18 | Wt 115.4 lb

## 2016-06-23 DIAGNOSIS — C50211 Malignant neoplasm of upper-inner quadrant of right female breast: Secondary | ICD-10-CM

## 2016-06-23 DIAGNOSIS — C50911 Malignant neoplasm of unspecified site of right female breast: Secondary | ICD-10-CM

## 2016-06-23 DIAGNOSIS — G620 Drug-induced polyneuropathy: Secondary | ICD-10-CM

## 2016-06-23 DIAGNOSIS — Z79811 Long term (current) use of aromatase inhibitors: Secondary | ICD-10-CM

## 2016-06-23 DIAGNOSIS — Z5112 Encounter for antineoplastic immunotherapy: Secondary | ICD-10-CM | POA: Diagnosis present

## 2016-06-23 DIAGNOSIS — I10 Essential (primary) hypertension: Secondary | ICD-10-CM

## 2016-06-23 DIAGNOSIS — Z780 Asymptomatic menopausal state: Secondary | ICD-10-CM

## 2016-06-23 DIAGNOSIS — Z170 Estrogen receptor positive status [ER+]: Secondary | ICD-10-CM

## 2016-06-23 MED ORDER — DIPHENHYDRAMINE HCL 25 MG PO CAPS
50.0000 mg | ORAL_CAPSULE | Freq: Once | ORAL | Status: DC
Start: 2016-06-23 — End: 2016-06-23

## 2016-06-23 MED ORDER — TRASTUZUMAB CHEMO INJECTION 440 MG
6.0000 mg/kg | Freq: Once | INTRAVENOUS | Status: AC
  Administered 2016-06-23: 315 mg via INTRAVENOUS
  Filled 2016-06-23: qty 15

## 2016-06-23 MED ORDER — HEPARIN SOD (PORK) LOCK FLUSH 100 UNIT/ML IV SOLN
500.0000 [IU] | Freq: Once | INTRAVENOUS | Status: AC | PRN
  Administered 2016-06-23: 500 [IU]

## 2016-06-23 MED ORDER — ACETAMINOPHEN 325 MG PO TABS: 650.0000 mg | ORAL_TABLET | Freq: Once | ORAL | Status: DC

## 2016-06-23 MED ORDER — SODIUM CHLORIDE 0.9% FLUSH
10.0000 mL | INTRAVENOUS | Status: DC | PRN
  Administered 2016-06-23: 10 mL
  Filled 2016-06-23: qty 10

## 2016-06-23 MED ORDER — HEPARIN SOD (PORK) LOCK FLUSH 100 UNIT/ML IV SOLN
INTRAVENOUS | Status: AC
  Filled 2016-06-23: qty 5

## 2016-06-23 MED ORDER — SODIUM CHLORIDE 0.9 % IV SOLN
Freq: Once | INTRAVENOUS | Status: AC
  Administered 2016-06-23: 11:00:00 via INTRAVENOUS

## 2016-06-23 NOTE — Progress Notes (Signed)
[facility] Progress Note  Patient Care Team: Lajean Manes, MD as PCP - General  CHIEF COMPLAINTS:  Invasive ductal carcinoma triple positive  ER+, PR+, HER 2 neu +  RIGHT breast cancer, upper inner quadrant Screening mammogram on 09/04/2015 with possible R breast mass 2 densities noted in the R breast upper inner quadrant within a centimeter of each other (9 and 10 cm from the nipple) 11/03/2015 partial mastectomy, sentinel node biopsy and port placement with Dr. Brantley Stage Final pathology pT1cN0M0 ER+PR+ Her 2 neu+ R breast carcinoma    Breast cancer of upper-inner quadrant of right female breast ([place])   09/01/2015 Mammogram    BI-RADS CATEGORY 0: Incomplete. Need additional imaging evaluation and/or prior mammograms for comparison.     09/15/2015 Imaging    CT chest- Right lower lobe 4 mm solid pulmonary nodule. If the patient is at high risk for bronchogenic carcinoma, follow-up chest CT at 1 year is recommended.     09/16/2015 Breast US    US showing a small hypoechoic irregular mass with hyperechoic rim in the 1 o'clock location of R breast 10 cm from the nipple. Mass measures 0.7 x 0.5 x 0.6 cm. 2nd mass is identified in the 1 o'clock location 9 cm from the nipple 0.7 x 0.7 x 0.6 cm     09/16/2015 Mammogram    Two suspicious masses in the 1 o'clock location of the right breast warranting tissue diagnosis.  No evidence for adenopathy.     09/21/2015 Pathology Results    Estrogen Receptor: 100%, POSITIVE, STRONG STAINING INTENSITY Progesterone Receptor: 10%, POSITIVE, MODERATE STAINING INTENSITY Proliferation Marker Ki67: 10%.  HER2 - **POSITIVE**     09/21/2015 Mammogram    Appropriate positioning of the 2 biopsy marking clips in the upper-inner quadrant of the right breast at 1 o'clock.     09/21/2015 Initial Biopsy    Breast, right, needle core biopsy, 1:00 o'clock, 9 CMFN - INVASIVE DUCTAL CARCINOMA. - DUCTAL CARCINOMA IN SITU. - SEE COMMENT. 2.  Breast, right, needle core biopsy, 1:00 o'clock, 10 CMFN - INVASIVE DUCTAL CARCINOMA. - DUCTAL CARCINOMA IN SITU WITH      10/26/2015 Procedure    Right chest porta cath placed with no adverse features.- Dr. Brantley Stage.     11/03/2015 Pathology Results    MULTIFOCAL INVASIVE DUCTAL CARCINOMA.  INVASIVE TUMOR IS 1.0 MM FROM NEAREST MARGIN (INFERIOR).  HIGH GRADE DUCTAL CARCINOMA IN SITU WITH NECROSIS.  IN SITU CARCINOMA IS 0.1 MM FROM THE NEAREST MARGIN (INFERIOR MARGIN).     11/03/2015 Procedure    Right lumpectomy by Dr. Brantley Stage.     11/17/2015 Initial Diagnosis    Breast cancer of upper-inner quadrant of right female breast ([place])     11/21/2015 Imaging    Normal left ventricular ejection fraction equal to 71.9%.     11/26/2015 -  Chemotherapy    Paclitaxel weekly x 12      11/26/2015 -  Antibody Plan    Herceptin weekly with Paclitaxel.     02/15/2016 Echocardiogram    [facility]- Normal LEFT ventricular ejection fraction 65% slightly decreased from 72% on the previous exam.      04/18/2016 Imaging    [facility]- Normal left ventricular ejection fraction equal to 70.1%. This has increased from 65.2% previously.       HISTORY OF PRESENTING ILLNESS:  (from recent visit 05/12/16) Der R Aufiero 75 y.o. female is here for follow-up of  R breast cancer, disease  is ER+, PR+,  HER 2 neu positive. She has completed weekly taxol/herceptin. She has completed. XRT and continuing with Q3 week herceptin.  She has no complaints today. Her sister's colon cancer is stage IV. This is a major source of focus for her right now. She will be trying to spend as much time with her as she can.   She continues to have pulmonary complaints. She has an upcoming appointment with Dr. Luan Pulling. Cough, sputum production. No fever.  She needs to start endocrine therapy and would like to do that on return.    INTERVAL HISTORY (06/23/16):   Mrs. Pangilinan returns to the [facility] today unaccompanied. She is seen in the infusion area  receiving treatment today.  She says that she's been having "kind of a bad cough," and was seen by Dr. Luan Pulling, her pulmonologist. She says she thinks she's going to start going to him for follow up on this bronchiectasis. She says she's still coughing up "a lot of that stuff."  When asked if she had started her Anastrazole yet, she tells me that she decided to wait to see [place] today at the cancer center to discuss her concerns.  She read the package insert for the medication and "I know on the side effects list" of the medicine she's been given, she's afraid "they could cause the cough to be worse." Mrs. Uphoff was advised that her Arimidex does not historically cause or worsen cough in most patients.  The mechanism of action and more common side effects of Arimidex was discussed in depth with the patient today.   She notes that she's ready to start on her medicine (Arimidex) tonight, but just wanted to ask about it before she got started.  She notes that she's doing okay in general; that her energy level is "getting better than it was." She says she got out and did a little yardwork the past few days, noting that "it almost killed her" but she did it.   In terms of appetite, she notes "nothing has been wrong with my appetite, the entire time."  During the physical exam, Mrs. Conkey confirms that her bowels are moving okay and that she is urinating okay, with no burning or other issues.  A small expiratory wheeze was heard during the physical exam. The patient is hopeful that the medicine she received from the pulmonologist will help.   MEDICAL HISTORY:  Past Medical History:  Diagnosis Date  . Asthma   . Breast cancer ([place])    Right breast  . Cancer ([place]) 2016   right breast  . Diabetes mellitus without complication ([place])   . GERD (gastroesophageal reflux disease)   . Hypertension   . Radiation 03/23/16-05/09/16   45 Gy to right breast, boosted to 16 Gy    SURGICAL HISTORY: Past Surgical  History:  Procedure Laterality Date  . ABDOMINAL HYSTERECTOMY  1983  . BREAST LUMPECTOMY WITH RADIOACTIVE SEED AND SENTINEL LYMPH NODE BIOPSY Right 11/03/2015   Procedure: RIGHT BREAST LUMPECTOMY WITH RADIOACTIVE SEED AND SENTINEL LYMPH NODE MAPPING;  Surgeon: Erroll Luna, MD;  Location: [facility];  Service: General;  Laterality: Right;  . NASAL FRACTURE SURGERY    . PORTACATH PLACEMENT Right 11/03/2015   Procedure: INSERTION PORT-A-CATH;  Surgeon: Erroll Luna, MD;  Location: [facility];  Service: General;  Laterality: Right;    SOCIAL HISTORY: Social History   Social History  . Marital status: Married    Spouse name: N/A  .  Number of children: N/A  . Years of education: N/A   Occupational History  . retired    Social History Main Topics  . Smoking status: Never Smoker  . Smokeless tobacco: Never Used  . Alcohol use No  . Drug use: No  . Sexual activity: No   Other Topics Concern  . Not on file   Social History Narrative  . No narrative on file  Married 51 years. 0 children. Fostered children. 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 She likes to sew, knit, and quilt. She likes to walk but does not do it often. Non smoker ETOH, none She is from [facility] and moved to [place] when she got married.  FAMILY HISTORY: Family History  Problem Relation Age of Onset  . Breast cancer Mother   . Leukemia Father    indicated that the status of her mother is unknown. She indicated that the status of her father is unknown.   Father died at 77 yo of leukemia Mother died of old age; breast cancer diagnosis at 74 yo. She had a biopsy, no treatment. 1 sister, healthy. No breast cancer.  ALLERGIES:  is allergic to lisinopril; losartan; and penicillins.  MEDICATIONS:  Current Outpatient Prescriptions  Medication Sig Dispense Refill  . albuterol (PROAIR HFA) 108 (90 BASE) MCG/ACT inhaler Inhale 2 puffs into  the lungs every 6 (six) hours as needed for wheezing or shortness of breath.    Marland Kitchen amLODipine (NORVASC) 2.5 MG tablet Take 2.5 mg by mouth daily.    Marland Kitchen anastrozole (ARIMIDEX) 1 MG tablet Take 1 tablet (1 mg total) by mouth daily. 30 tablet 3  . atorvastatin (LIPITOR) 10 MG tablet Take 10 mg by mouth daily.    Marland Kitchen BREO ELLIPTA 100-25 MCG/INH AEPB     . Calcium Carbonate-Vitamin D (CALCIUM + D PO) Take 1 tablet by mouth every morning.    . Cholecalciferol (VITAMIN D3) 2000 units TABS Take by mouth.    . dextromethorphan-guaiFENesin (MUCINEX DM) 30-600 MG 12hr tablet Take 1 tablet by mouth daily as needed. Reported on 04/18/2016    . famotidine (PEPCID) 20 MG tablet Take 20 mg by mouth daily.     Marland Kitchen lidocaine-prilocaine (EMLA) cream Apply a quarter size amount to port site 1 hour prior to chemo. Do not rub in. Cover with plastic wrap. 30 g 3  . metformin (FORTAMET) 500 MG (OSM) 24 hr tablet Take 1 tablet (500 mg total) by mouth daily with breakfast. 30 tablet 3  . montelukast (SINGULAIR) 10 MG tablet     . omeprazole (PRILOSEC) 40 MG capsule Take 40 mg by mouth daily.    . ondansetron (ZOFRAN) 8 MG tablet Take 1 tablet (8 mg total) by mouth every 8 (eight) hours as needed for nausea or vomiting. 30 tablet 2  . prochlorperazine (COMPAZINE) 10 MG tablet Take 1 tablet (10 mg total) by mouth every 6 (six) hours as needed for nausea or vomiting. 30 tablet 2  . Trastuzumab (HERCEPTIN IV) Inject into the vein. To start on November 26, 2015.    . valsartan (DIOVAN) 40 MG tablet TAKE 1 TABLET (40 MG TOTAL) BY MOUTH DAILY. 30 tablet 1   No current facility-administered medications for this visit.    Facility-Administered Medications Ordered in Other Visits  Medication Dose Route Frequency Provider Last Rate Last Dose  . acetaminophen (TYLENOL) tablet 650 mg  650 mg Oral Once Patrici Ranks, MD      . diphenhydrAMINE (  BENADRYL) capsule 50 mg  50 mg Oral Once Patrici Ranks, MD      . heparin lock flush 100  unit/mL  500 Units Intracatheter Once PRN Patrici Ranks, MD      . sodium chloride flush (NS) 0.9 % injection 10 mL  10 mL Intracatheter PRN Patrici Ranks, MD   10 mL at 06/23/16 1115  . trastuzumab (HERCEPTIN) 315 mg in sodium chloride 0.9 % 250 mL chemo infusion  6 mg/kg (Treatment Plan Recorded) Intravenous Once Patrici Ranks, MD 530 mL/hr at 06/23/16 1225 315 mg at 06/23/16 1225    Review of Systems  Constitutional: Negative for fever, chills and malaise/fatigue.  HENT: Negative.  Negative for congestion, hearing loss, nosebleeds, sore throat and tinnitus.   Eyes: Negative.  Negative for blurred vision, double vision, pain and discharge.  Respiratory: Positive for cough and shortness of breath. Negative for hemoptysis, sputum production and wheezing.        Cough which leads to breathing issues.  Cardiovascular: Negative.  Negative for chest pain, palpitations, claudication, leg swelling and PND.  Gastrointestinal: Negative for heartburn, nausea, abdominal pain, diarrhea, constipation, blood in stool and melena.  Genitourinary: Negative.  Negative for dysuria, urgency, frequency and hematuria.  Musculoskeletal: Negative.  Negative for myalgias, joint pain and falls.  Skin: Negative.  Negative for itching and rash.  Neurological: Negative for dizziness, tingling, tremors, sensory change, speech change, focal weakness, seizures, loss of consciousness, weakness and headaches.  Endo/Heme/Allergies: Bruises/bleeds easily.       Bruising on arm  Psychiatric/Behavioral: Negative for depression, suicidal ideas, memory loss and substance abuse. The patient is not nervous/anxious.   All other systems reviewed and are negative.  14 point ROS was done and is otherwise as detailed above or in HPI   PHYSICAL EXAMINATION: ECOG PERFORMANCE STATUS: 0 - Asymptomatic   Vitals  2/95/28  SYSTOLIC 413  DIASTOLIC 68  Pulse 72  Temperature 97.6  Respirations 18  O2 sat           97% on room  air  Physical Exam  Constitutional: She is oriented to person, place, and time and well-developed, well-nourished, and in no distress.  Wears glasses. Noted hair growth.  HENT:  Head: Normocephalic.  Nose: Nose normal.  Mouth/Throat: Oropharynx is clear and moist. No oropharyngeal exudate.  Eyes: Conjunctivae and EOM are normal. Pupils are equal, round, and reactive to light. Right eye exhibits no discharge. Left eye exhibits no discharge. No scleral icterus.  Neck: Normal range of motion. Neck supple. No tracheal deviation present. No thyromegaly present.  Cardiovascular: Normal rate, regular rhythm and normal heart sounds.  Exam reveals no gallop and no friction rub.   No murmur heard. Pulmonary/Chest: Effort normal. Expiratory wheezes in bilateral upper lobes; otherwise clear to auscultation.  Abdominal: Soft. Bowel sounds are normal. She exhibits no distension and no mass. There is no tenderness. There is no rebound and no guarding.  Musculoskeletal:  She exhibits no edema.  Neurological: She is alert and oriented to person, place, and time. She has normal reflexes. No cranial nerve deficit. Coordination normal.  Skin: Skin is warm and dry. No rash noted.  Psychiatric: Mood, memory, affect and judgment normal.  Nursing note and vitals reviewed.   LABORATORY DATA:  I have reviewed the data as listed Lab Results  Component Value Date   WBC 6.5 06/02/2016   HGB 12.1 06/02/2016   HCT 37.5 06/02/2016   MCV 91.2 06/02/2016   PLT  222 06/02/2016   CMP     Component Value Date/Time   NA 138 06/02/2016 1154   K 3.6 06/02/2016 1154   CL 104 06/02/2016 1154   CO2 25 06/02/2016 1154   GLUCOSE 150 (H) 06/02/2016 1154   BUN 13 06/02/2016 1154   CREATININE 0.89 06/02/2016 1154   CALCIUM 8.5 (L) 06/02/2016 1154   PROT 6.5 06/02/2016 1154   ALBUMIN 3.7 06/02/2016 1154   AST 27 06/02/2016 1154   ALT 21 06/02/2016 1154   ALKPHOS 111 06/02/2016 1154   BILITOT 0.9 06/02/2016 1154    GFRNONAA >60 06/02/2016 1154   GFRAA >60 06/02/2016 1154    RADIOGRAPHIC STUDIES: I have personally reviewed the radiological images as listed and agreed with the findings in the report.  CLINICAL DATA: History of breast cancer.  EXAM: NUCLEAR MEDICINE CARDIAC BLOOD POOL IMAGING ([facility])  TECHNIQUE: Cardiac multi-gated acquisition was performed at rest following intravenous injection of Tc-83mlabeled red blood cells.  RADIOPHARMACEUTICALS: 24.8 mCi Tc-946mDP in-vitro labeled red blood cells IV  COMPARISON: 02/15/2016  FINDINGS: The left ventricular ejection fraction equals 70.1%. On the previous exam this was equal to 65.2%. Normal left ventricular wall motion.  IMPRESSION: 1. Normal left ventricular ejection fraction equal to 70.1%. This has increased from 65.2% previously.   Electronically Signed  By: TaKerby Moors.D.  On: 04/18/2016 11:55   PATHOLOGY:   ASSESSMENT & PLAN:  Invasive ductal carcinoma triple positive  ER+ (100%), PR+ (10%), HER 2 neu +  RIGHT breast cancer, upper inner quadrant Screening mammogram on 09/04/2015 with possible R breast mass 2 densities noted in the R breast upper inner quadrant within a centimeter of each other (9 and 10 cm from the nipple) 11/03/2015 partial mastectomy, sentinel node biopsy and port placement with Dr. CoBrantley Stageinal pathology pT1cN0M0 ER+PR+ Her 2 neu+ R breast carcinoma Grade I chemotherapy induced neuropathy feet  Cough Hypertension   I have given her a prescription for a Z-pak and medrol dose pak as she is traveling to her sister's for the next several weeks. She has an upcoming appointment with Dr. HaLuan PullingShe is concerned about the pulmonary nodule noted on CT imaging last year. We will arrange for CT imaging in November. She is concerned about her ongoing pulmonary issues. She notes that her lungs were clear and cough was gone during chemotherapy while she was on steroids with weekly therapy.     Goal again is to complete 52 weeks of herceptin. She will return in 3 weeks for PE and ongoing therapy. She needs to start AI therapy. She will need a DEXA as well She would like to wait until her next visit to discuss. We will therefore discuss endocrine therapy and initiate at follow-up.   She is following with her PCP in regards to her HTN.    ASSESSMENT & PLAN (06/23/16):   -Given her concerns with her cough and whether an aromatase inhibitor will exacerbate it, the nature of Arimidex was discussed in depth with the patient today.   -We discussed how she will need a DEXA bone density scan to get a baseline to see her bone mineral density now, and then monitor that over a few years while she's taking the Arimidex. I will place the orders for the DEXA scan, and advised the patient to continue taking Calcium and Vitamin D. The supplementation may need to be increased depending on the results of her scan.  -She will continue to receive her maintenance Herceptin as  scheduled.  She will take the Arimidex for the next 5 years, receiving DEXA scans every 2 years.   -If she feels her cough is getting worse, or there are unmanageable symptoms that are difficult to live with after starting the Arimidex, she knows to call and let us know.    -She will return to the cancer center to see Dr. Whitney Muse towards the end of 06/2016 to assess her tolerance to Arimidex thus far, as well as receive her next dose of maintenance Herceptin. She understands that it may take several weeks for [place] to see how she tolerates the anastrazole.   I will try to coordinate her DEXA scan with these other appointments as well to get a baseline bone mineral density assessment.      No orders of the defined types were placed in this encounter.  All questions were answered. The patient knows to call the clinic with any problems, questions or concerns.  This document serves as a record of services personally performed by Mike Craze, NP. It was created on her behalf by Toni Amend, a trained medical scribe. The creation of this record is based on the scribe's personal observations and the provider's statements to them. This document has been checked and approved by the attending provider.   I have reviewed the above documentation for accuracy and completeness and I agree with the above.   Mike Craze, NP 06/23/2016 12:37 PM

## 2016-06-23 NOTE — Progress Notes (Signed)
Tolerated herceptin infusion well. Ambulatory on discharge home to self.

## 2016-06-23 NOTE — Patient Instructions (Signed)
[facility] Discharge Instructions for Patients Receiving Chemotherapy   Beginning January 23rd 2017 lab work for the Cancer Center will be done in the  Main lab at [facility] on 1st floor. If you have a lab appointment with the Cancer Center please come in thru the  Main Entrance and check in at the main information desk   Today you received the following chemotherapy agents Herceptin. If you develop nausea and vomiting, or diarrhea that is not controlled by your medication, call the clinic.  The clinic phone number is [phone]. Office hours are Monday-Friday 8:30am-5:00pm.  BELOW ARE SYMPTOMS THAT SHOULD BE REPORTED IMMEDIATELY:  *FEVER GREATER THAN 101.0 F  *CHILLS WITH OR WITHOUT FEVER  NAUSEA AND VOMITING THAT IS NOT CONTROLLED WITH YOUR NAUSEA MEDICATION  *UNUSUAL SHORTNESS OF BREATH  *UNUSUAL BRUISING OR BLEEDING  TENDERNESS IN MOUTH AND THROAT WITH OR WITHOUT PRESENCE OF ULCERS  *URINARY PROBLEMS  *BOWEL PROBLEMS  UNUSUAL RASH Items with * indicate a potential emergency and should be followed up as soon as possible. If you have an emergency after office hours please contact your primary care physician or go to the nearest emergency department.  Please call the clinic during office hours if you have any questions or concerns.   You may also contact the Patient Navigator at [phone] should you have any questions or need assistance in obtaining follow up care.      Resources For Cancer Patients and their Caregivers ? American Cancer Society: Can assist with transportation, wigs, general needs, runs Look Good Feel Better.        1-888-227-6333 ? Cancer Care: Provides financial assistance, online support groups, medication/co-pay assistance.  1-800-813-HOPE (4673) ? [facility] Assists [place] cancer patients and their families through emotional , educational and financial support.   336-427-4357 ? [place] DSS Where to apply for food stamps, Medicaid and utility assistance. 336-342-1394 ? RC[facility]: Transportation to medical appointments. 336-347-2287 ? Social Security Administration: May apply for disability if have a Stage IV cancer. 336-342-7796 1-800-772-1213 ? [place] Aging, Disability and Transit Services: Assists with nutrition, care and transit needs. 336-349-2343         

## 2016-06-27 ENCOUNTER — Telehealth (HOSPITAL_COMMUNITY): Payer: Self-pay

## 2016-06-27 NOTE — Telephone Encounter (Signed)
Patients pharmacy called and said that for the last 3 months they have been filling her Metformin with a generic, Glucophage ER. The pharmacist states that the original prescription was for a drug that is osmotic and the glucophage Er is not osmotic. The glucophage is also cheaper that what the original prescription was written for. Reviewed with PA, notified pharmacist to leave as they had been filling.

## 2016-07-14 ENCOUNTER — Encounter (HOSPITAL_BASED_OUTPATIENT_CLINIC_OR_DEPARTMENT_OTHER): Payer: Medicare Other | Admitting: Hematology & Oncology

## 2016-07-14 ENCOUNTER — Inpatient Hospital Stay (HOSPITAL_COMMUNITY): Payer: Medicare Other

## 2016-07-14 ENCOUNTER — Encounter (HOSPITAL_COMMUNITY): Payer: Self-pay | Admitting: Hematology & Oncology

## 2016-07-14 ENCOUNTER — Other Ambulatory Visit: Payer: Self-pay | Admitting: Adult Health

## 2016-07-14 ENCOUNTER — Ambulatory Visit (HOSPITAL_COMMUNITY)
Admission: RE | Admit: 2016-07-14 | Discharge: 2016-07-14 | Disposition: A | Discharge status: Home / Self Care | Payer: Medicare Other | Source: Ambulatory Visit | Attending: Adult Health | Admitting: Adult Health

## 2016-07-14 ENCOUNTER — Encounter (HOSPITAL_BASED_OUTPATIENT_CLINIC_OR_DEPARTMENT_OTHER): Payer: Medicare Other

## 2016-07-14 VITALS — BP 140/62 | HR 72 | Temp 98.1°F | Resp 18 | Wt 116.6 lb

## 2016-07-14 DIAGNOSIS — C50211 Malignant neoplasm of upper-inner quadrant of right female breast: Secondary | ICD-10-CM

## 2016-07-14 DIAGNOSIS — R059 Cough, unspecified: Secondary | ICD-10-CM

## 2016-07-14 DIAGNOSIS — R05 Cough: Secondary | ICD-10-CM

## 2016-07-14 DIAGNOSIS — Z5112 Encounter for antineoplastic immunotherapy: Secondary | ICD-10-CM

## 2016-07-14 DIAGNOSIS — C50911 Malignant neoplasm of unspecified site of right female breast: Secondary | ICD-10-CM | POA: Diagnosis not present

## 2016-07-14 DIAGNOSIS — Z79811 Long term (current) use of aromatase inhibitors: Secondary | ICD-10-CM | POA: Insufficient documentation

## 2016-07-14 DIAGNOSIS — Z780 Asymptomatic menopausal state: Secondary | ICD-10-CM

## 2016-07-14 DIAGNOSIS — T451X5A Adverse effect of antineoplastic and immunosuppressive drugs, initial encounter: Secondary | ICD-10-CM

## 2016-07-14 DIAGNOSIS — M810 Age-related osteoporosis without current pathological fracture: Secondary | ICD-10-CM | POA: Diagnosis not present

## 2016-07-14 DIAGNOSIS — Z79899 Other long term (current) drug therapy: Secondary | ICD-10-CM

## 2016-07-14 DIAGNOSIS — G620 Drug-induced polyneuropathy: Secondary | ICD-10-CM | POA: Diagnosis not present

## 2016-07-14 DIAGNOSIS — R911 Solitary pulmonary nodule: Secondary | ICD-10-CM

## 2016-07-14 DIAGNOSIS — M2550 Pain in unspecified joint: Secondary | ICD-10-CM

## 2016-07-14 DIAGNOSIS — Z170 Estrogen receptor positive status [ER+]: Secondary | ICD-10-CM | POA: Diagnosis not present

## 2016-07-14 LAB — COMPREHENSIVE METABOLIC PANEL
ALBUMIN: 3.9 g/dL (ref 3.5–5.0)
ALT: 14 U/L (ref 14–54)
ANION GAP: 7 (ref 5–15)
AST: 22 U/L (ref 15–41)
Alkaline Phosphatase: 116 U/L (ref 38–126)
BILIRUBIN TOTAL: 0.9 mg/dL (ref 0.3–1.2)
BUN: 10 mg/dL (ref 6–20)
CO2: 25 mmol/L (ref 22–32)
Calcium: 9.2 mg/dL (ref 8.9–10.3)
Chloride: 106 mmol/L (ref 101–111)
Creatinine, Ser: 0.91 mg/dL (ref 0.44–1.00)
GLUCOSE: 126 mg/dL — AB (ref 65–99)
POTASSIUM: 3.6 mmol/L (ref 3.5–5.1)
Sodium: 138 mmol/L (ref 135–145)
TOTAL PROTEIN: 6.9 g/dL (ref 6.5–8.1)

## 2016-07-14 LAB — CBC WITH DIFFERENTIAL/PLATELET
BASOS PCT: 2 %
Basophils Absolute: 0.2 10*3/uL — ABNORMAL HIGH (ref 0.0–0.1)
Eosinophils Absolute: 1.5 10*3/uL — ABNORMAL HIGH (ref 0.0–0.7)
Eosinophils Relative: 17 %
HEMATOCRIT: 37.9 % (ref 36.0–46.0)
Hemoglobin: 12.3 g/dL (ref 12.0–15.0)
Lymphocytes Relative: 21 %
Lymphs Abs: 1.9 10*3/uL (ref 0.7–4.0)
MCH: 29.5 pg (ref 26.0–34.0)
MCHC: 32.5 g/dL (ref 30.0–36.0)
MCV: 90.9 fL (ref 78.0–100.0)
MONO ABS: 0.6 10*3/uL (ref 0.1–1.0)
MONOS PCT: 6 %
NEUTROS ABS: 4.8 10*3/uL (ref 1.7–7.7)
Neutrophils Relative %: 54 %
Platelets: 304 10*3/uL (ref 150–400)
RBC: 4.17 MIL/uL (ref 3.87–5.11)
RDW: 13.6 % (ref 11.5–15.5)
WBC: 8.9 10*3/uL (ref 4.0–10.5)

## 2016-07-14 MED ORDER — DIPHENHYDRAMINE HCL 25 MG PO CAPS
50.0000 mg | ORAL_CAPSULE | Freq: Once | ORAL | Status: AC
  Administered 2016-07-14: 50 mg via ORAL
  Filled 2016-07-14: qty 2

## 2016-07-14 MED ORDER — TRASTUZUMAB CHEMO 150 MG IV SOLR
6.0000 mg/kg | Freq: Once | INTRAVENOUS | Status: AC
  Administered 2016-07-14: 315 mg via INTRAVENOUS
  Filled 2016-07-14: qty 0.72

## 2016-07-14 MED ORDER — METHYLPREDNISOLONE 4 MG PO TBPK: ORAL_TABLET | Freq: Every day | ORAL | 0 refills | Status: DC

## 2016-07-14 MED ORDER — TRASTUZUMAB CHEMO INJECTION 440 MG
6.0000 mg/kg | Freq: Once | INTRAVENOUS | Status: DC
  Filled 2016-07-14: qty 15

## 2016-07-14 MED ORDER — HEPARIN SOD (PORK) LOCK FLUSH 100 UNIT/ML IV SOLN
500.0000 [IU] | Freq: Once | INTRAVENOUS | Status: AC | PRN
  Administered 2016-07-14: 500 [IU]
  Filled 2016-07-14: qty 5

## 2016-07-14 MED ORDER — SODIUM CHLORIDE 0.9% FLUSH
10.0000 mL | INTRAVENOUS | Status: DC | PRN
  Administered 2016-07-14: 10 mL
  Filled 2016-07-14: qty 10

## 2016-07-14 MED ORDER — ACETAMINOPHEN 325 MG PO TABS
650.0000 mg | ORAL_TABLET | Freq: Once | ORAL | Status: AC
  Administered 2016-07-14: 650 mg via ORAL
  Filled 2016-07-14: qty 2

## 2016-07-14 MED ORDER — SODIUM CHLORIDE 0.9 % IV SOLN
Freq: Once | INTRAVENOUS | Status: AC
  Administered 2016-07-14: 12:00:00 via INTRAVENOUS

## 2016-07-14 NOTE — Patient Instructions (Signed)
[place] at [facility] Discharge Instructions  RECOMMENDATIONS MADE BY THE CONSULTANT AND ANY TEST RESULTS WILL BE SENT TO YOUR REFERRING PHYSICIAN.  You saw Dr. Whitney Muse today Follow up in 3 weeks with Dr. Whitney Muse on same day as labs and Herceptin. MUGA scan on 07/20/16.   Thank you for choosing [place] at [facility] to provide your oncology and hematology care.  To afford each patient quality time with our provider, please arrive at least 15 minutes before your scheduled appointment time.   Beginning January 23rd 2017 lab work for the [facility] will be done in the  Main lab at [facility] on 1st floor. If you have a lab appointment with the [facility] please come in thru the  Main Entrance and check in at the main information desk  You need to re-schedule your appointment should you arrive 10 or more minutes late.  We strive to give you quality time with our providers, and arriving late affects you and other patients whose appointments are after yours.  Also, if you no show three or more times for appointments you may be dismissed from the clinic at the providers discretion.     Again, thank you for choosing [facility].  Our hope is that these requests will decrease the amount of time that you wait before being seen by our physicians.       _____________________________________________________________  Should you have questions after your visit to [facility], please contact our office at [phone] between the hours of 8:30 a.m. and 4:30 p.m.  Voicemails left after 4:30 p.m. will not be returned until the following business day.  For prescription refill requests, have your pharmacy contact our office.         Resources For Cancer Patients and their Caregivers ? American Cancer Society: Can assist with transportation, wigs, general needs, runs Look Good Feel Better.        332-125-5378 ? Cancer  Care: Provides financial assistance, online support groups, medication/co-pay assistance.  1-800-813-HOPE 819-672-4693) ? [facility] Assists [place] cancer patients and their families through emotional , educational and financial support.  405-577-8768 ? [place] DSS Where to apply for food stamps, Medicaid and utility assistance. 681-734-7455 ? RCATS: Transportation to medical appointments. (406)566-9505 ? Social Security Administration: May apply for disability if have a Stage IV cancer. 469-882-7484 269-861-0846 ? [employer], Disability and Transit Services: Assists with nutrition, care and transit needs. [place] Support Programs: @10RELATIVEDAYS @ > Cancer Support Group  2nd Tuesday of the month 1pm-2pm, Journey Room  > Creative Journey  3rd Tuesday of the month 1130am-1pm, Journey Room  > Look Good Feel Better  1st Wednesday of the month 10am-12 noon, Journey Room (Call [facility] to register [phone])

## 2016-07-14 NOTE — Progress Notes (Signed)
Lori Greene tolerated chemo tx well without complaints.VSS upon discharge. Pt discharged self ambulatory in satisfactory condition

## 2016-07-14 NOTE — Patient Instructions (Signed)
[facility] Discharge Instructions for Patients Receiving Chemotherapy   Beginning January 23rd 2017 lab work for the Cancer Center will be done in the  Main lab at [facility] on 1st floor. If you have a lab appointment with the Cancer Center please come in thru the  Main Entrance and check in at the main information desk   Today you received the following chemotherapy agents Herceptin. Follow-up as scheduled. Call clinic for any questions or concerns  To help prevent nausea and vomiting after your treatment, we encourage you to take your nausea medication.   If you develop nausea and vomiting, or diarrhea that is not controlled by your medication, call the clinic.  The clinic phone number is [phone]. Office hours are Monday-Friday 8:30am-5:00pm.  BELOW ARE SYMPTOMS THAT SHOULD BE REPORTED IMMEDIATELY:  *FEVER GREATER THAN 101.0 F  *CHILLS WITH OR WITHOUT FEVER  NAUSEA AND VOMITING THAT IS NOT CONTROLLED WITH YOUR NAUSEA MEDICATION  *UNUSUAL SHORTNESS OF BREATH  *UNUSUAL BRUISING OR BLEEDING  TENDERNESS IN MOUTH AND THROAT WITH OR WITHOUT PRESENCE OF ULCERS  *URINARY PROBLEMS  *BOWEL PROBLEMS  UNUSUAL RASH Items with * indicate a potential emergency and should be followed up as soon as possible. If you have an emergency after office hours please contact your primary care physician or go to the nearest emergency department.  Please call the clinic during office hours if you have any questions or concerns.   You may also contact the Patient Navigator at [phone] should you have any questions or need assistance in obtaining follow up care.      Resources For Cancer Patients and their Caregivers ? American Cancer Society: Can assist with transportation, wigs, general needs, runs Look Good Feel Better.        1-888-227-6333 ? Cancer Care: Provides financial assistance, online support groups, medication/co-pay assistance.  1-800-813-HOPE  (4673) ? [facility] Assists [place] cancer patients and their families through emotional , educational and financial support.  336-427-4357 ? [place] DSS Where to apply for food stamps, Medicaid and utility assistance. 336-342-1394 ? [employer]: Transportation to medical appointments. 336-347-2287 ? Social Security Administration: May apply for disability if have a Stage IV cancer. 336-342-7796 1-800-772-1213 ? [place] Aging, Disability and Transit Services: Assists with nutrition, care and transit needs. 336-349-2343         

## 2016-07-14 NOTE — Progress Notes (Signed)
[place] at [facility] Progress Note  Patient Care Team: Lajean Manes, MD as PCP - General  CHIEF COMPLAINTS:  Invasive ductal carcinoma triple positive  ER+, PR+, HER 2 neu +  RIGHT breast cancer, upper inner quadrant Screening mammogram on 09/04/2015 with possible R breast mass 2 densities noted in the R breast upper inner quadrant within a centimeter of each other (9 and 10 cm from the nipple) 11/03/2015 partial mastectomy, sentinel node biopsy and port placement with Dr. Brantley Stage Final pathology [id number] ER+PR+ Her 2 neu+ R breast carcinoma    Breast cancer of upper-inner quadrant of right female breast ([place])   09/01/2015 Mammogram    BI-RADS CATEGORY 0: Incomplete. Need additional imaging evaluation and/or prior mammograms for comparison.      09/15/2015 Imaging    CT chest- Right lower lobe 4 mm solid pulmonary nodule. If the patient is at high risk for bronchogenic carcinoma, follow-up chest CT at 1 year is recommended.      09/16/2015 Breast US    US showing a small hypoechoic irregular mass with hyperechoic rim in the 1 o'clock location of R breast 10 cm from the nipple. Mass measures 0.7 x 0.5 x 0.6 cm. 2nd mass is identified in the 1 o'clock location 9 cm from the nipple 0.7 x 0.7 x 0.6 cm      09/16/2015 Mammogram    Two suspicious masses in the 1 o'clock location of the right breast warranting tissue diagnosis.  No evidence for adenopathy.      09/21/2015 Pathology Results    Estrogen Receptor: 100%, POSITIVE, STRONG STAINING INTENSITY Progesterone Receptor: 10%, POSITIVE, MODERATE STAINING INTENSITY Proliferation Marker Ki67: 10%.  HER2 - **POSITIVE**      09/21/2015 Mammogram    Appropriate positioning of the 2 biopsy marking clips in the upper-inner quadrant of the right breast at 1 o'clock.      09/21/2015 Initial Biopsy    Breast, right, needle core biopsy, 1:00 o'clock, 9 CMFN - INVASIVE DUCTAL CARCINOMA. - DUCTAL CARCINOMA IN SITU. - SEE  COMMENT. 2. Breast, right, needle core biopsy, 1:00 o'clock, 10 CMFN - INVASIVE DUCTAL CARCINOMA. - DUCTAL CARCINOMA IN SITU WITH       10/26/2015 Procedure    Right chest porta cath placed with no adverse features.- Dr. Brantley Stage.      11/03/2015 Pathology Results    MULTIFOCAL INVASIVE DUCTAL CARCINOMA.  INVASIVE TUMOR IS 1.0 MM FROM NEAREST MARGIN (INFERIOR).  HIGH GRADE DUCTAL CARCINOMA IN SITU WITH NECROSIS.  IN SITU CARCINOMA IS 0.1 MM FROM THE NEAREST MARGIN (INFERIOR MARGIN).      11/03/2015 Procedure    Right lumpectomy by Dr. Brantley Stage.      11/17/2015 Initial Diagnosis    Breast cancer of upper-inner quadrant of right female breast ([place])      11/21/2015 Imaging    Normal left ventricular ejection fraction equal to 71.9%.      11/26/2015 -  Chemotherapy    Paclitaxel weekly x 12       11/26/2015 -  Antibody Plan    Herceptin weekly with Paclitaxel.      02/15/2016 Echocardiogram    MUGA- Normal LEFT ventricular ejection fraction 65% slightly decreased from 72% on the previous exam.       04/18/2016 Imaging    MUGA- Normal left ventricular ejection fraction equal to 70.1%. This has increased from 65.2% previously.        HISTORY OF PRESENTING ILLNESS:  Lori Greene 75  y.o. female is here for follow-up of  R breast cancer, disease is ER+, PR+,  HER 2 neu positive. She has completed weekly taxol/herceptin. She has completed. XRT and continuing with Q3 week herceptin.  Patient was in treatment chair and receiving cycle 8 of Q 3 week herceptin.   Patient has been feeling ok. She sees Dr. Luan Pulling Tuesday.   Patient stated that she has joint pain, especially in the hands, she notes that it is difficult to grip her steering wheel of her car at times.   She mentioned that her cough is worse. She again remarks that the best she felt from a pulmonary status was when she was getting chemotherapy and steroids on a weekly basis. She is requesting a steroid prescription today. She said  she had gone out in the yard and is not sure if that is what exacerbated her cough.  Patient's blood pressure is better today.  When asked if patient has albuterol inhaler, she said yes. She notes that it does not help  Patient's sister has stage IV colon cancer. Lori Greene notes that she has been spending time with her. She was just there for 2 weeks.    MEDICAL HISTORY:  Past Medical History:  Diagnosis Date  . Asthma   . Breast cancer ([place])    Right breast  . Cancer ([place]) 2016   right breast  . Diabetes mellitus without complication ([place])   . GERD (gastroesophageal reflux disease)   . Hypertension   . Radiation 03/23/16-05/09/16   45 Gy to right breast, boosted to 16 Gy    SURGICAL HISTORY: Past Surgical History:  Procedure Laterality Date  . ABDOMINAL HYSTERECTOMY  1983  . BREAST LUMPECTOMY WITH RADIOACTIVE SEED AND SENTINEL LYMPH NODE BIOPSY Right 11/03/2015   Procedure: RIGHT BREAST LUMPECTOMY WITH RADIOACTIVE SEED AND SENTINEL LYMPH NODE MAPPING;  Surgeon: Erroll Luna, MD;  Location: [facility];  Service: General;  Laterality: Right;  . NASAL FRACTURE SURGERY    . PORTACATH PLACEMENT Right 11/03/2015   Procedure: INSERTION PORT-A-CATH;  Surgeon: Erroll Luna, MD;  Location: [facility];  Service: General;  Laterality: Right;    SOCIAL HISTORY: Social History   Social History  . Marital status: Married    Spouse name: N/A  . Number of children: N/A  . Years of education: N/A   Occupational History  . retired    Social History Main Topics  . Smoking status: Never Smoker  . Smokeless tobacco: Never Used  . Alcohol use No  . Drug use: No  . Sexual activity: No   Other Topics Concern  . Not on file   Social History Narrative  . No narrative on file  Married 51 years. 0 children. Fostered children. 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 She likes to sew, knit, and quilt. She likes to walk but does not  do it often. Non smoker ETOH, none She is from [facility] and moved to [place] when she got married.  FAMILY HISTORY: Family History  Problem Relation Age of Onset  . Breast cancer Mother   . Leukemia Father    indicated that the status of her mother is unknown. She indicated that the status of her father is unknown.   Father died at 44 yo of leukemia Mother died of old age; breast cancer diagnosis at 74 yo. She had a biopsy, no treatment. 1 sister, healthy. No breast cancer. Sister with stage IV colon cancer  ALLERGIES:  is allergic to lisinopril; losartan; and penicillins.  MEDICATIONS:  Current Outpatient Prescriptions  Medication Sig Dispense Refill  . albuterol (PROAIR HFA) 108 (90 BASE) MCG/ACT inhaler Inhale 2 puffs into the lungs every 6 (six) hours as needed for wheezing or shortness of breath.    Marland Kitchen amLODipine (NORVASC) 2.5 MG tablet Take 2.5 mg by mouth daily.    Marland Kitchen anastrozole (ARIMIDEX) 1 MG tablet Take 1 tablet (1 mg total) by mouth daily. 30 tablet 3  . atorvastatin (LIPITOR) 10 MG tablet Take 10 mg by mouth daily.    Marland Kitchen BREO ELLIPTA 100-25 MCG/INH AEPB     . Calcium Carbonate-Vitamin D (CALCIUM + D PO) Take 1 tablet by mouth every morning.    . Cholecalciferol (VITAMIN D3) 2000 units TABS Take by mouth.    . dextromethorphan-guaiFENesin (MUCINEX DM) 30-600 MG 12hr tablet Take 1 tablet by mouth daily as needed. Reported on 04/18/2016    . famotidine (PEPCID) 20 MG tablet Take 20 mg by mouth daily.     Marland Kitchen lidocaine-prilocaine (EMLA) cream Apply a quarter size amount to port site 1 hour prior to chemo. Do not rub in. Cover with plastic wrap. 30 g 3  . metformin (FORTAMET) 500 MG (OSM) 24 hr tablet Take 1 tablet (500 mg total) by mouth daily with breakfast. 30 tablet 3  . methylPREDNISolone (MEDROL DOSEPAK) 4 MG TBPK tablet Take by mouth daily. Use as directed 21 tablet 0  . montelukast (SINGULAIR) 10 MG tablet     . omeprazole (PRILOSEC) 40 MG capsule Take 40 mg by  mouth daily.    . ondansetron (ZOFRAN) 8 MG tablet Take 1 tablet (8 mg total) by mouth every 8 (eight) hours as needed for nausea or vomiting. 30 tablet 2  . prochlorperazine (COMPAZINE) 10 MG tablet Take 1 tablet (10 mg total) by mouth every 6 (six) hours as needed for nausea or vomiting. 30 tablet 2  . Trastuzumab (HERCEPTIN IV) Inject into the vein. To start on November 26, 2015.    . valsartan (DIOVAN) 40 MG tablet TAKE 1 TABLET (40 MG TOTAL) BY MOUTH DAILY. 30 tablet 1   No current facility-administered medications for this visit.    Facility-Administered Medications Ordered in Other Visits  Medication Dose Route Frequency Provider Last Rate Last Dose  . sodium chloride flush (NS) 0.9 % injection 10 mL  10 mL Intracatheter PRN Patrici Ranks, MD   10 mL at 07/14/16 1125    Review of Systems  Constitutional: Negative for chills, fever and malaise/fatigue.  HENT: Negative.  Negative for congestion, hearing loss, nosebleeds, sore throat and tinnitus.   Eyes: Negative.  Negative for blurred vision, double vision, pain and discharge.  Respiratory: Positive for cough and shortness of breath. Negative for hemoptysis, sputum production and wheezing.        Cough (worse) which leads to breathing issues.  Cardiovascular: Negative.  Negative for chest pain, palpitations, claudication, leg swelling and PND.  Gastrointestinal: Negative for abdominal pain, blood in stool, constipation, diarrhea, heartburn, melena and nausea.  Genitourinary: Negative.  Negative for dysuria, frequency, hematuria and urgency.  Musculoskeletal: Positive for joint pain. Negative for falls and myalgias.       Joint pain from aromatase inhibitor  Skin: Negative.  Negative for itching and rash.  Neurological: Negative for dizziness, tingling, tremors, sensory change, speech change, focal weakness, seizures, loss of consciousness, weakness and headaches.  Psychiatric/Behavioral: Negative for depression, memory loss,  substance abuse and suicidal ideas. The  patient is not nervous/anxious.   All other systems reviewed and are negative.   14 point ROS was done and is otherwise as detailed above or in HPI    PHYSICAL EXAMINATION: ECOG PERFORMANCE STATUS: 0 - Asymptomatic  Vitals with BMI 07/14/2016  Height   Weight   BMI   Systolic 314  Diastolic 62  Pulse 72  Respirations 18    Physical Exam  Constitutional: She is oriented to person, place, and time and well-developed, well-nourished, and in no distress.  Wears glasses. Noted hair growth.  HENT:  Head: Normocephalic and atraumatic.  Nose: Nose normal.  Mouth/Throat: Oropharynx is clear and moist. No oropharyngeal exudate.  Eyes: Conjunctivae and EOM are normal. Pupils are equal, round, and reactive to light. Right eye exhibits no discharge. Left eye exhibits no discharge. No scleral icterus.  Neck: Normal range of motion. Neck supple. No tracheal deviation present. No thyromegaly present.  Cardiovascular: Normal rate, regular rhythm and normal heart sounds.  Exam reveals no gallop and no friction rub.   No murmur heard. Pulmonary/Chest: Effort normal. She has wheezes. She has no rales.  A lot of wheezing and rhonchi in lungs  Abdominal: Soft. Bowel sounds are normal. She exhibits no distension and no mass. There is no tenderness. There is no rebound and no guarding.  Musculoskeletal: Normal range of motion. She exhibits no edema.  Lymphadenopathy:    She has no cervical adenopathy.  Neurological: She is alert and oriented to person, place, and time. She has normal reflexes. No cranial nerve deficit. Gait normal. Coordination normal.  Skin: Skin is warm and dry. No rash noted.  Psychiatric: Mood, memory, affect and judgment normal.  Nursing note and vitals reviewed.   LABORATORY DATA:  I have reviewed the data as listed Lab Results  Component Value Date   WBC 8.9 07/14/2016   HGB 12.3 07/14/2016   HCT 37.9 07/14/2016   MCV 90.9  07/14/2016   PLT 304 07/14/2016   CMP     Component Value Date/Time   NA 138 07/14/2016 1124   K 3.6 07/14/2016 1124   CL 106 07/14/2016 1124   CO2 25 07/14/2016 1124   GLUCOSE 126 (H) 07/14/2016 1124   BUN 10 07/14/2016 1124   CREATININE 0.91 07/14/2016 1124   CALCIUM 9.2 07/14/2016 1124   PROT 6.9 07/14/2016 1124   ALBUMIN 3.9 07/14/2016 1124   AST 22 07/14/2016 1124   ALT 14 07/14/2016 1124   ALKPHOS 116 07/14/2016 1124   BILITOT 0.9 07/14/2016 1124   GFRNONAA >60 07/14/2016 1124   GFRAA >60 07/14/2016 1124    RADIOGRAPHIC STUDIES: I have personally reviewed the radiological images as listed and agreed with the findings in the report.  CLINICAL DATA: History of breast cancer.  EXAM: NUCLEAR MEDICINE CARDIAC BLOOD POOL IMAGING (MUGA)  TECHNIQUE: Cardiac multi-gated acquisition was performed at rest following intravenous injection of Tc-69mlabeled red blood cells.  RADIOPHARMACEUTICALS: 24.8 mCi Tc-94mDP in-vitro labeled red blood cells IV  COMPARISON: 02/15/2016  FINDINGS: The left ventricular ejection fraction equals 70.1%. On the previous exam this was equal to 65.2%. Normal left ventricular wall motion.  IMPRESSION: 1. Normal left ventricular ejection fraction equal to 70.1%. This has increased from 65.2% previously.   Electronically Signed  By: TaKerby Moors.D.  On: 04/18/2016 11:55   PATHOLOGY:     ASSESSMENT & PLAN:  Invasive ductal carcinoma triple positive  ER+ (100%), PR+ (10%), HER 2 neu +  RIGHT breast cancer, upper inner  quadrant Screening mammogram on 09/04/2015 with possible R breast mass 2 densities noted in the R breast upper inner quadrant within a centimeter of each other (9 and 10 cm from the nipple) 11/03/2015 partial mastectomy, sentinel node biopsy and port placement with Dr. Brantley Stage Final pathology [id number] ER+PR+ Her 2 neu+ R breast carcinoma Grade I chemotherapy induced neuropathy feet  Cough,  wheezing Hypertension DEXA with osteoporosis Aromatase inhibitor related joint pain RLL pulmonary nodule  I have given her a prescription for a medrol dose pak given her cough and wheezing but strongly encouraged her to NOT use it as she sees Dr. Luan Pulling on Tuesday. We will arrange for CT imaging in November in regards to her pulmonary nodule unless Dr. Luan Pulling orders prior. She is concerned about her ongoing pulmonary issues.   Goal again is to complete 52 weeks of herceptin. She will return in 3 weeks for PE and ongoing therapy.   We reviewed joint pain and AI therapy. She is to continue on calcium and vitamin D. Studies have found joint symptoms most often develop within the first 3 months on therapy, though some cases continue to develop after 3 months. In studies of AI-related joint symptoms, anywhere from 20-50% of women on therapy reported the side effect. Symptoms typically affect the fingers, hands, wrists, elbows, shoulders, knees and ankles.  FROM UP To DATE Managing joint symptoms First and foremost, patients should talk with their oncology team if they develop joint symptoms. The oncology team is there to support each patient to complete their prescribed therapy and will help identify ways to manage this side effect. To derive the full benefit from AI therapy, it is important to take the medication daily, for as long as it is prescribed. Medications for symptom management A significant component of the joint symptoms experienced with AIs is swelling of the joints. Therefore, a medication that decreases this swelling, such as non-steroidal anti-inflammatory drugs (NSAIDS, such as ibuprofen) or a coxib (such as celecoxib, or Celebrex), may help with pain relief. However, these medications are not without side effects of their own, so each patient should discuss his/her health history with the team before starting these medications. Additional pain relieving medications, including  acetaminophen and opiods, can be added for those patients who do not get relief with an anti-inflammatory alone or for those who cannot tolerate the anti-inflammatory medications. Switching AIs While there are no studies to confirm the effectiveness of changing to another drug, some practitioners will switch to another AI or tamoxifen when symptoms are unmanageable and may lead to the patient stopping therapy altogether. Prior to switching, it may be beneficial to stop therapy for 6 to 8 weeks to be sure that the AI is the cause of the symptoms. Supplements and other therapies There has been recent interest in the possibility that vitamin D supplementation may help to decrease AI-related arthralgia, although this has not been proven in clinical trials. Some studies have suggested that calcium and bisphosphonate therapy (used to prevent/treat osteoporosis) may also prevent AI-related joint symptoms. Small studies have found a benefit to acupuncture and exercise. Acupuncture was shown to reduce pain from joint symptoms and improve functioning and well-being. Gentle stretching and exercise may also be helpful in reducing symptoms.  WE will re assess at follow-up.  She has osteoporosis and will need bone directed therapy. I will discuss prolia, fosamax or other oral bisphosphonates with her at return.   She is following with her PCP in regards to her HTN.  She is due for another MUGA in September.  Follow up in 3 weeks with labs.  All questions were answered. The patient knows to call the clinic with any problems, questions or concerns.  This note was electronically signed.   This document serves as a record of services personally performed by Ancil Linsey, MD. It was created on her behalf by Elmyra Ricks, a trained medical scribe. The creation of this record is based on the scribe's personal observations and the provider's statements to them. This document has been checked and approved by the  attending provider..  I have reviewed the above documentation for accuracy and completeness, and I agree with the above. Molli Hazard, MD  07/14/2016 3:43 PM

## 2016-07-15 LAB — VITAMIN D 25 HYDROXY (VIT D DEFICIENCY, FRACTURES): VIT D 25 HYDROXY: 45.2 ng/mL (ref 30.0–100.0)

## 2016-07-18 ENCOUNTER — Encounter (HOSPITAL_COMMUNITY): Payer: Self-pay | Admitting: Hematology & Oncology

## 2016-07-19 DIAGNOSIS — R911 Solitary pulmonary nodule: Secondary | ICD-10-CM | POA: Diagnosis not present

## 2016-07-19 DIAGNOSIS — J479 Bronchiectasis, uncomplicated: Secondary | ICD-10-CM | POA: Diagnosis not present

## 2016-07-19 DIAGNOSIS — C50911 Malignant neoplasm of unspecified site of right female breast: Secondary | ICD-10-CM | POA: Diagnosis not present

## 2016-07-20 ENCOUNTER — Encounter (HOSPITAL_COMMUNITY)
Admission: RE | Admit: 2016-07-20 | Discharge: 2016-07-20 | Disposition: A | Discharge status: Home / Self Care | Payer: Medicare Other | Source: Ambulatory Visit | Attending: Hematology & Oncology | Admitting: Hematology & Oncology

## 2016-07-20 ENCOUNTER — Encounter (HOSPITAL_COMMUNITY): Payer: Self-pay

## 2016-07-20 DIAGNOSIS — Z79899 Other long term (current) drug therapy: Secondary | ICD-10-CM | POA: Diagnosis not present

## 2016-07-20 DIAGNOSIS — C50211 Malignant neoplasm of upper-inner quadrant of right female breast: Secondary | ICD-10-CM | POA: Diagnosis not present

## 2016-07-20 DIAGNOSIS — C50919 Malignant neoplasm of unspecified site of unspecified female breast: Secondary | ICD-10-CM | POA: Diagnosis not present

## 2016-07-20 MED ORDER — SODIUM CHLORIDE 0.9% FLUSH
INTRAVENOUS | Status: AC
  Filled 2016-07-20: qty 100

## 2016-07-20 MED ORDER — HEPARIN SOD (PORK) LOCK FLUSH 100 UNIT/ML IV SOLN
INTRAVENOUS | Status: AC
  Filled 2016-07-20: qty 5

## 2016-07-20 MED ORDER — TECHNETIUM TC 99M-LABELED RED BLOOD CELLS IV KIT
20.0000 | PACK | Freq: Once | INTRAVENOUS | Status: AC | PRN
  Administered 2016-07-20: 20 via INTRAVENOUS

## 2016-07-21 ENCOUNTER — Other Ambulatory Visit (HOSPITAL_COMMUNITY): Payer: Self-pay | Admitting: Hematology & Oncology

## 2016-07-21 DIAGNOSIS — C50211 Malignant neoplasm of upper-inner quadrant of right female breast: Secondary | ICD-10-CM

## 2016-07-21 DIAGNOSIS — Z79899 Other long term (current) drug therapy: Secondary | ICD-10-CM

## 2016-07-25 ENCOUNTER — Other Ambulatory Visit (HOSPITAL_COMMUNITY): Payer: Self-pay

## 2016-07-25 DIAGNOSIS — C50211 Malignant neoplasm of upper-inner quadrant of right female breast: Secondary | ICD-10-CM

## 2016-07-25 MED ORDER — ANASTROZOLE 1 MG PO TABS: 1.0000 mg | ORAL_TABLET | Freq: Every day | ORAL | 1 refills | Status: DC

## 2016-07-25 NOTE — Telephone Encounter (Signed)
Refill request received for anastrozole.  Refilled per Kirby Crigler, PA-C.

## 2016-07-27 ENCOUNTER — Telehealth (HOSPITAL_COMMUNITY): Payer: Self-pay | Admitting: Emergency Medicine

## 2016-07-27 NOTE — Telephone Encounter (Signed)
Pt wanted to make sure her metformin had been refilled. It had been sent into [facility] on 07/24/2016

## 2016-08-03 NOTE — Progress Notes (Signed)
[place] [facility] Progress Note  Patient Care Team: Lajean Manes, MD as PCP - General  CHIEF COMPLAINTS:  Invasive ductal carcinoma triple positive  ER+, PR+, HER 2 neu +  RIGHT breast cancer, upper inner quadrant Screening mammogram on 09/04/2015 with possible R breast mass 2 densities noted in the R breast upper inner quadrant within a centimeter of each other (9 and 10 cm from the nipple) 11/03/2015 partial mastectomy, sentinel node biopsy and port placement with Dr. Brantley Stage Final pathology [id number] ER+PR+ Her 2 neu+ R breast carcinoma    Breast cancer of upper-inner quadrant of right female breast ([place])   09/01/2015 Mammogram    BI-RADS CATEGORY 0: Incomplete. Need additional imaging evaluation and/or prior mammograms for comparison.      09/15/2015 Imaging    CT chest- Right lower lobe 4 mm solid pulmonary nodule. If the patient is at high risk for bronchogenic carcinoma, follow-up chest CT at 1 year is recommended.      09/16/2015 Breast US    US showing a small hypoechoic irregular mass with hyperechoic rim in the 1 o'clock location of R breast 10 cm from the nipple. Mass measures 0.7 x 0.5 x 0.6 cm. 2nd mass is identified in the 1 o'clock location 9 cm from the nipple 0.7 x 0.7 x 0.6 cm      09/16/2015 Mammogram    Two suspicious masses in the 1 o'clock location of the right breast warranting tissue diagnosis.  No evidence for adenopathy.      09/21/2015 Pathology Results    Estrogen Receptor: 100%, POSITIVE, STRONG STAINING INTENSITY Progesterone Receptor: 10%, POSITIVE, MODERATE STAINING INTENSITY Proliferation Marker Ki67: 10%.  HER2 - **POSITIVE**      09/21/2015 Mammogram    Appropriate positioning of the 2 biopsy marking clips in the upper-inner quadrant of the right breast at 1 o'clock.      09/21/2015 Initial Biopsy    Breast, right, needle core biopsy, 1:00 o'clock, 9 CMFN - INVASIVE DUCTAL CARCINOMA. - DUCTAL CARCINOMA IN SITU. - SEE  COMMENT. 2. Breast, right, needle core biopsy, 1:00 o'clock, 10 CMFN - INVASIVE DUCTAL CARCINOMA. - DUCTAL CARCINOMA IN SITU WITH       10/26/2015 Procedure    Right chest porta cath placed with no adverse features.- Dr. Brantley Stage.      11/03/2015 Pathology Results    MULTIFOCAL INVASIVE DUCTAL CARCINOMA.  INVASIVE TUMOR IS 1.0 MM FROM NEAREST MARGIN (INFERIOR).  HIGH GRADE DUCTAL CARCINOMA IN SITU WITH NECROSIS.  IN SITU CARCINOMA IS 0.1 MM FROM THE NEAREST MARGIN (INFERIOR MARGIN).      11/03/2015 Procedure    Right lumpectomy by Dr. Brantley Stage.      11/17/2015 Initial Diagnosis    Breast cancer of upper-inner quadrant of right female breast ([place])      11/21/2015 Imaging    Normal left ventricular ejection fraction equal to 71.9%.      11/26/2015 -  Chemotherapy    Paclitaxel weekly x 12       11/26/2015 -  Antibody Plan    Herceptin weekly with Paclitaxel.      02/15/2016 Echocardiogram    MUGA- Normal LEFT ventricular ejection fraction 65% slightly decreased from 72% on the previous exam.       04/18/2016 Imaging    MUGA- Normal left ventricular ejection fraction equal to 70.1%. This has increased from 65.2% previously.       07/26/2016 Echocardiogram    MUGA Left ventricular ejection fraction  equals 73 %       HISTORY OF PRESENTING ILLNESS:  Lori Greene 75 y.o. female is here for follow-up of  R breast cancer, disease is ER+, PR+,  HER 2 neu positive. She has completed weekly taxol/herceptin. She has completed. XRT and continuing with Q3 week herceptin.  Patient was in treatment chair and receiving cycle 8 of Q 3 week herceptin.   Patient has been feeling ok.   Patient stated that she has joint pain, especially in the hands, she notes this is improved from her last visit.   Patient's blood pressure is better today.  Patient's sister has stage IV colon cancer. Lori Greene notes that she has been spending time with her. Her sister's health is declining. She has refused all  therapy.   Her breathing has been better. She was started on prednisone and an antibiotic by Dr. Luan Pulling. She has also been started on another inhaler. She sees him again in about a month, he feels she has bronchiectasis.  She denies palpitations or chest paint.    MEDICAL HISTORY:  Past Medical History:  Diagnosis Date  . Asthma   . Breast cancer ([place])    Right breast  . Cancer ([place]) 2016   right breast  . Diabetes mellitus without complication ([place])   . GERD (gastroesophageal reflux disease)   . Hypertension   . Radiation 03/23/16-05/09/16   45 Gy to right breast, boosted to 16 Gy    SURGICAL HISTORY: Past Surgical History:  Procedure Laterality Date  . ABDOMINAL HYSTERECTOMY  1983  . BREAST LUMPECTOMY WITH RADIOACTIVE SEED AND SENTINEL LYMPH NODE BIOPSY Right 11/03/2015   Procedure: RIGHT BREAST LUMPECTOMY WITH RADIOACTIVE SEED AND SENTINEL LYMPH NODE MAPPING;  Surgeon: Erroll Luna, MD;  Location: [place];  Service: General;  Laterality: Right;  . NASAL FRACTURE SURGERY    . PORTACATH PLACEMENT Right 11/03/2015   Procedure: INSERTION PORT-A-CATH;  Surgeon: Erroll Luna, MD;  Location: [place];  Service: General;  Laterality: Right;    SOCIAL HISTORY: Social History   Social History  . Marital status: Married    Spouse name: N/A  . Number of children: N/A  . Years of education: N/A   Occupational History  . retired    Social History Main Topics  . Smoking status: Never Smoker  . Smokeless tobacco: Never Used  . Alcohol use No  . Drug use: No  . Sexual activity: No   Other Topics Concern  . Not on file   Social History Narrative  . No narrative on file  Married 51 years. 0 children. Fostered children. 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 She likes to sew, knit, and quilt. She likes to walk but does not do it often. Non smoker ETOH, none She is from [place] [place] [facility] and moved to [place]  when she got married.  FAMILY HISTORY: Family History  Problem Relation Age of Onset  . Breast cancer Mother   . Leukemia Father    indicated that the status of her mother is unknown. She indicated that the status of her father is unknown.   Father died at 74 yo of leukemia Mother died of old age; breast cancer diagnosis at 74 yo. She had a biopsy, no treatment. 1 sister, healthy. No breast cancer. Sister with stage IV colon cancer  ALLERGIES:  is allergic to lisinopril; losartan; and penicillins.  MEDICATIONS:  Current Outpatient Prescriptions  Medication Sig Dispense Refill  .  albuterol (PROAIR HFA) 108 (90 BASE) MCG/ACT inhaler Inhale 2 puffs into the lungs every 6 (six) hours as needed for wheezing or shortness of breath.    Marland Kitchen amLODipine (NORVASC) 2.5 MG tablet Take 2.5 mg by mouth daily.    Marland Kitchen anastrozole (ARIMIDEX) 1 MG tablet Take 1 tablet (1 mg total) by mouth daily. 90 tablet 1  . atorvastatin (LIPITOR) 10 MG tablet Take 10 mg by mouth daily.    Marland Kitchen BREO ELLIPTA 100-25 MCG/INH AEPB     . Calcium Carbonate-Vitamin D (CALCIUM + D PO) Take 1 tablet by mouth every morning.    . Cholecalciferol (VITAMIN D3) 2000 units TABS Take by mouth.    . dextromethorphan-guaiFENesin (MUCINEX DM) 30-600 MG 12hr tablet Take 1 tablet by mouth daily as needed. Reported on 04/18/2016    . famotidine (PEPCID) 20 MG tablet Take 20 mg by mouth daily.     Marland Kitchen lidocaine-prilocaine (EMLA) cream Apply a quarter size amount to port site 1 hour prior to chemo. Do not rub in. Cover with plastic wrap. 30 g 3  . metFORMIN (GLUCOPHAGE-XR) 500 MG 24 hr tablet TAKE 1 TABLET (500 MG TOTAL) BY MOUTH DAILY WITH BREAKFAST. 30 tablet 3  . methylPREDNISolone (MEDROL DOSEPAK) 4 MG TBPK tablet Take by mouth daily. Use as directed 21 tablet 0  . montelukast (SINGULAIR) 10 MG tablet     . omeprazole (PRILOSEC) 40 MG capsule Take 40 mg by mouth daily.    . ondansetron (ZOFRAN) 8 MG tablet Take 1 tablet (8 mg total) by mouth  every 8 (eight) hours as needed for nausea or vomiting. 30 tablet 2  . prochlorperazine (COMPAZINE) 10 MG tablet Take 1 tablet (10 mg total) by mouth every 6 (six) hours as needed for nausea or vomiting. 30 tablet 2  . Trastuzumab (HERCEPTIN IV) Inject into the vein. To start on November 26, 2015.    . valsartan (DIOVAN) 40 MG tablet TAKE 1 TABLET BY MOUTH EVERY DAY 30 tablet 1   No current facility-administered medications for this visit.     Review of Systems  Constitutional: Negative for chills, fever and malaise/fatigue.  HENT: Negative.  Negative for congestion, hearing loss, nosebleeds, sore throat and tinnitus.   Eyes: Negative.  Negative for blurred vision, double vision, pain and discharge.  Respiratory: Positive for cough and shortness of breath. Negative for hemoptysis, sputum production and wheezing.        Chronic  Cardiovascular: Negative.  Negative for chest pain, palpitations, claudication, leg swelling and PND.  Gastrointestinal: Negative for abdominal pain, blood in stool, constipation, diarrhea, heartburn, melena and nausea.  Genitourinary: Negative.  Negative for dysuria, frequency, hematuria and urgency.  Musculoskeletal: Positive for joint pain. Negative for falls and myalgias.       Joint pain from aromatase inhibitor  Skin: Negative.  Negative for itching and rash.  Neurological: Negative for dizziness, tingling, tremors, sensory change, speech change, focal weakness, seizures, loss of consciousness, weakness and headaches.  Psychiatric/Behavioral: Negative for depression, memory loss, substance abuse and suicidal ideas. The patient is not nervous/anxious.   All other systems reviewed and are negative. 14 point ROS was done and is otherwise as detailed above or in HPI    PHYSICAL EXAMINATION: ECOG PERFORMANCE STATUS: 0 - Asymptomatic   Vitals - 1 value per visit 0/98/1191  SYSTOLIC 478  DIASTOLIC 65  Pulse 66  Temperature 98.1  Respirations 18  Weight (lb)  116  Height   BMI 22.65  VISIT REPORT  Physical Exam  Constitutional: She is oriented to person, place, and time and well-developed, well-nourished, and in no distress.  Wears glasses. Noted hair growth.  HENT:  Head: Normocephalic and atraumatic.  Nose: Nose normal.  Mouth/Throat: Oropharynx is clear and moist. No oropharyngeal exudate.  Eyes: Conjunctivae and EOM are normal. Pupils are equal, round, and reactive to light. Right eye exhibits no discharge. Left eye exhibits no discharge. No scleral icterus.  Neck: Normal range of motion. Neck supple. No tracheal deviation present. No thyromegaly present.  Cardiovascular: Normal rate, regular rhythm and normal heart sounds.  Exam reveals no gallop and no friction rub.   No murmur heard. Pulmonary/Chest: Effort normal and breath sounds normal. She has no wheezes. She has no rales.  Abdominal: Soft. Bowel sounds are normal. She exhibits no distension and no mass. There is no tenderness. There is no rebound and no guarding.  Musculoskeletal: Normal range of motion. She exhibits no edema.  Lymphadenopathy:    She has no cervical adenopathy.  Neurological: She is alert and oriented to person, place, and time. She has normal reflexes. No cranial nerve deficit. Gait normal. Coordination normal.  Skin: Skin is warm and dry. No rash noted.  Psychiatric: Mood, memory, affect and judgment normal.  Nursing note and vitals reviewed.   LABORATORY DATA:  I have reviewed the data as listed Lab Results  Component Value Date   WBC 8.9 07/14/2016   HGB 12.3 07/14/2016   HCT 37.9 07/14/2016   MCV 90.9 07/14/2016   PLT 304 07/14/2016   CMP     Component Value Date/Time   NA 138 07/14/2016 1124   K 3.6 07/14/2016 1124   CL 106 07/14/2016 1124   CO2 25 07/14/2016 1124   GLUCOSE 126 (H) 07/14/2016 1124   BUN 10 07/14/2016 1124   CREATININE 0.91 07/14/2016 1124   CALCIUM 9.2 07/14/2016 1124   PROT 6.9 07/14/2016 1124   ALBUMIN 3.9  07/14/2016 1124   AST 22 07/14/2016 1124   ALT 14 07/14/2016 1124   ALKPHOS 116 07/14/2016 1124   BILITOT 0.9 07/14/2016 1124   GFRNONAA >60 07/14/2016 1124   GFRAA >60 07/14/2016 1124    RADIOGRAPHIC STUDIES: I have personally reviewed the radiological images as listed and agreed with the findings in the report. Study Result   CLINICAL DATA:  Breast cancer. Evaluate cardiac function in relation to chemotherapy.  EXAM: NUCLEAR MEDICINE CARDIAC BLOOD POOL IMAGING (MUGA)  TECHNIQUE: Cardiac multi-gated acquisition was performed at rest following intravenous injection of Tc-58mlabeled red blood cells.  RADIOPHARMACEUTICALS:  Twenty mCi Tc-966mDP in-vitro labeled red blood cells IV  COMPARISON:  Amy EGA scan, 04/18/2016, 02/15/2016, 11/19/2015  FINDINGS: No  focal wall motion abnormality of the left ventricle.  Calculated left ventricular ejection fraction equals 73%. Previously 70%.  IMPRESSION: Left ventricular ejection fraction equals 73 %.   Electronically Signed   By: StSuzy Bouchard.D.   On: 07/26/2016 15:53    PATHOLOGY:     ASSESSMENT & PLAN:  Invasive ductal carcinoma triple positive  ER+ (100%), PR+ (10%), HER 2 neu +  RIGHT breast cancer, upper inner quadrant Screening mammogram on 09/04/2015 with possible R breast mass 2 densities noted in the R breast upper inner quadrant within a centimeter of each other (9 and 10 cm from the nipple) 11/03/2015 partial mastectomy, sentinel node biopsy and port placement with Dr. CoBrantley Stageinal pathology [id number] ER+PR+ Her 2 neu+ R breast carcinoma Grade I chemotherapy induced neuropathy feet  Cough, wheezing Hypertension DEXA with osteoporosis Aromatase inhibitor related joint pain RLL pulmonary nodule Bronchiectasis  She is doing better from a pulmonary perspective.She will continue to follow with Dr. Luan Pulling.  MUGA is up to date.   Goal again is to complete 52 weeks of herceptin. She will  return in 3 weeks for PE and ongoing therapy.   She has osteoporosis and will need bone directed therapy. I discussed prolia, bisphosphonates with her today. She is reluctant to do any bone directed therapy. I discussed changing to tamoxifen. She will be given information on prolia, bisphosphonates and we will readdress at follow-up.   RTC 3 weeks with labs, PE and ongoing herceptin.   All questions were answered. The patient knows to call the clinic with any problems, questions or concerns.  This note was electronically signed.   This document serves as a record of services personally performed by Ancil Linsey, MD. It was created on her behalf by Arlyce Harman, a trained medical scribe. The creation of this record is based on the scribe's personal observations and the provider's statements to them. This document has been checked and approved by the attending provider.  I have reviewed the above documentation for accuracy and completeness, and I agree with the above. Molli Hazard, MD  08/14/2016 3:09 PM

## 2016-08-04 ENCOUNTER — Encounter (HOSPITAL_COMMUNITY): Payer: Self-pay | Admitting: Hematology & Oncology

## 2016-08-04 ENCOUNTER — Telehealth (HOSPITAL_COMMUNITY): Payer: Self-pay | Admitting: Emergency Medicine

## 2016-08-04 ENCOUNTER — Encounter (HOSPITAL_COMMUNITY): Payer: Medicare Other | Attending: Hematology & Oncology | Admitting: Hematology & Oncology

## 2016-08-04 ENCOUNTER — Encounter (HOSPITAL_BASED_OUTPATIENT_CLINIC_OR_DEPARTMENT_OTHER): Payer: Medicare Other

## 2016-08-04 VITALS — BP 137/65 | HR 66 | Temp 98.1°F | Resp 18 | Wt 116.0 lb

## 2016-08-04 DIAGNOSIS — C50911 Malignant neoplasm of unspecified site of right female breast: Secondary | ICD-10-CM

## 2016-08-04 DIAGNOSIS — Z170 Estrogen receptor positive status [ER+]: Secondary | ICD-10-CM | POA: Diagnosis not present

## 2016-08-04 DIAGNOSIS — Z5112 Encounter for antineoplastic immunotherapy: Secondary | ICD-10-CM | POA: Diagnosis present

## 2016-08-04 DIAGNOSIS — J479 Bronchiectasis, uncomplicated: Secondary | ICD-10-CM

## 2016-08-04 DIAGNOSIS — Z79811 Long term (current) use of aromatase inhibitors: Secondary | ICD-10-CM | POA: Diagnosis not present

## 2016-08-04 DIAGNOSIS — M810 Age-related osteoporosis without current pathological fracture: Secondary | ICD-10-CM

## 2016-08-04 DIAGNOSIS — C50211 Malignant neoplasm of upper-inner quadrant of right female breast: Secondary | ICD-10-CM

## 2016-08-04 MED ORDER — DIPHENHYDRAMINE HCL 25 MG PO CAPS
50.0000 mg | ORAL_CAPSULE | Freq: Once | ORAL | Status: AC
Start: 2016-08-04 — End: 2016-08-04
  Administered 2016-08-04: 50 mg via ORAL
  Filled 2016-08-04: qty 2

## 2016-08-04 MED ORDER — ACETAMINOPHEN 325 MG PO TABS
650.0000 mg | ORAL_TABLET | Freq: Once | ORAL | Status: AC
  Administered 2016-08-04: 650 mg via ORAL
  Filled 2016-08-04: qty 2

## 2016-08-04 MED ORDER — TRASTUZUMAB CHEMO 150 MG IV SOLR
6.0000 mg/kg | Freq: Once | INTRAVENOUS | Status: AC
  Administered 2016-08-04: 315 mg via INTRAVENOUS
  Filled 2016-08-04: qty 15

## 2016-08-04 MED ORDER — HEPARIN SOD (PORK) LOCK FLUSH 100 UNIT/ML IV SOLN
500.0000 [IU] | Freq: Once | INTRAVENOUS | Status: AC | PRN
Start: 2016-08-04 — End: 2016-08-04
  Administered 2016-08-04: 500 [IU]
  Filled 2016-08-04: qty 5

## 2016-08-04 MED ORDER — TRASTUZUMAB CHEMO INJECTION 440 MG
6.0000 mg/kg | Freq: Once | INTRAVENOUS | Status: DC
  Filled 2016-08-04: qty 15

## 2016-08-04 MED ORDER — SODIUM CHLORIDE 0.9 % IV SOLN
Freq: Once | INTRAVENOUS | Status: AC
  Administered 2016-08-04: 14:00:00 via INTRAVENOUS

## 2016-08-04 MED ORDER — SODIUM CHLORIDE 0.9% FLUSH
10.0000 mL | INTRAVENOUS | Status: DC | PRN
  Administered 2016-08-04: 10 mL
  Filled 2016-08-04: qty 10

## 2016-08-04 NOTE — Telephone Encounter (Signed)
Pt called because he schedule was not right when she left the clinic today.  I added a herceptin in 3 weeks.  Appt made for 08-25-2016 at 11:45am Pt verbalized understanding

## 2016-08-04 NOTE — Progress Notes (Signed)
Lori Greene tolerated chemo tx well without complaints or incident. VSS upon discharge.Pt refused the Influenza vaccine for today.Pt discharged self ambulatory in satisfactory condition

## 2016-08-04 NOTE — Patient Instructions (Signed)
[place] at [facility] Discharge Instructions  RECOMMENDATIONS MADE BY THE CONSULTANT AND ANY TEST RESULTS WILL BE SENT TO YOUR REFERRING PHYSICIAN.  You saw Dr. Whitney Muse today. Continue herceptin. Follow up in 6 weeks with labs with Dr. Whitney Muse. Information on Prolia and Fosamax given.  Thank you for choosing [place] at [facility] to provide your oncology and hematology care.  To afford each patient quality time with our provider, please arrive at least 15 minutes before your scheduled appointment time.   Beginning January 23rd 2017 lab work for the [facility] will be done in the  Main lab at [facility] on 1st floor. If you have a lab appointment with the [facility] please come in thru the  Main Entrance and check in at the main information desk  You need to re-schedule your appointment should you arrive 10 or more minutes late.  We strive to give you quality time with our providers, and arriving late affects you and other patients whose appointments are after yours.  Also, if you no show three or more times for appointments you may be dismissed from the clinic at the providers discretion.     Again, thank you for choosing [facility].  Our hope is that these requests will decrease the amount of time that you wait before being seen by our physicians.       _____________________________________________________________  Should you have questions after your visit to [facility], please contact our office at [phone] between the hours of 8:30 a.m. and 4:30 p.m.  Voicemails left after 4:30 p.m. will not be returned until the following business day.  For prescription refill requests, have your pharmacy contact our office.         Resources For Cancer Patients and their Caregivers ? American Cancer Society: Can assist with transportation, wigs, general needs, runs Look Good Feel Better.         919-092-0434 ? Cancer Care: Provides financial assistance, online support groups, medication/co-pay assistance.  1-800-813-HOPE 347 264 2757) ? [place] Assists [place] cancer patients and their families through emotional , educational and financial support.  940 274 7647 ? [place] DSS Where to apply for food stamps, Medicaid and utility assistance. 772-286-4802 ? RCATS: Transportation to medical appointments. 843-314-7013 ? Social Security Administration: May apply for disability if have a Stage IV cancer. 601-880-5132 [phone] ? [employer], Disability and Transit Services: Assists with nutrition, care and transit needs. [place] Support Programs: @10RELATIVEDAYS @ > Cancer Support Group  2nd Tuesday of the month 1pm-2pm, Journey Room  > Creative Journey  3rd Tuesday of the month 1130am-1pm, Journey Room  > Look Good Feel Better  1st Wednesday of the month 10am-12 noon, Journey Room (Call American Cancer Society to register [phone])   Alendronate tablets What is this medicine? ALENDRONATE (a LEN droe nate) slows calcium loss from bones. It helps to make normal healthy bone and to slow bone loss in people with Paget's disease and osteoporosis. It may be used in others at risk for bone loss. This medicine may be used for other purposes; ask your health care provider or pharmacist if you have questions. What should I tell my health care provider before I take this medicine? They need to know if you have any of these conditions: -dental disease -esophagus, stomach, or intestine problems, like acid reflux or GERD -kidney disease -low blood calcium -low vitamin D -  problems sitting or standing 30 minutes -trouble swallowing -an unusual or allergic reaction to alendronate, other medicines, foods, dyes, or preservatives -pregnant or trying to get pregnant -breast-feeding How should I use this medicine? You  must take this medicine exactly as directed or you will lower the amount of the medicine you absorb into your body or you may cause yourself harm. Take this medicine by mouth first thing in the morning, after you are up for the day. Do not eat or drink anything before you take your medicine. Swallow the tablet with a full glass (6 to 8 fluid ounces) of plain water. Do not take this medicine with any other drink. Do not chew or crush the tablet. After taking this medicine, do not eat breakfast, drink, or take any medicines or vitamins for at least 30 minutes. Sit or stand up for at least 30 minutes after you take this medicine; do not lie down. Do not take your medicine more often than directed. Talk to your pediatrician regarding the use of this medicine in children. Special care may be needed. Overdosage: If you think you have taken too much of this medicine contact a poison control center or emergency room at once. NOTE: This medicine is only for you. Do not share this medicine with others. What if I miss a dose? If you miss a dose, do not take it later in the day. Continue your normal schedule starting the next morning. Do not take double or extra doses. What may interact with this medicine? -aluminum hydroxide -antacids -aspirin -calcium supplements -drugs for inflammation like ibuprofen, naproxen, and others -iron supplements -magnesium supplements -vitamins with minerals This list may not describe all possible interactions. Give your health care provider a list of all the medicines, herbs, non-prescription drugs, or dietary supplements you use. Also tell them if you smoke, drink alcohol, or use illegal drugs. Some items may interact with your medicine. What should I watch for while using this medicine? Visit your doctor or health care professional for regular checks ups. It may be some time before you see benefit from this medicine. Do not stop taking your medicine except on your doctor's  advice. Your doctor or health care professional may order blood tests and other tests to see how you are doing. You should make sure you get enough calcium and vitamin D while you are taking this medicine, unless your doctor tells you not to. Discuss the foods you eat and the vitamins you take with your health care professional. Some people who take this medicine have severe bone, joint, and/or muscle pain. This medicine may also increase your risk for a broken thigh bone. Tell your doctor right away if you have pain in your upper leg or groin. Tell your doctor if you have any pain that does not go away or that gets worse. This medicine can make you more sensitive to the sun. If you get a rash while taking this medicine, sunlight may cause the rash to get worse. Keep out of the sun. If you cannot avoid being in the sun, wear protective clothing and use sunscreen. Do not use sun lamps or tanning beds/booths. What side effects may I notice from receiving this medicine? Side effects that you should report to your doctor or health care professional as soon as possible: -allergic reactions like skin rash, itching or hives, swelling of the face, lips, or tongue -black or tarry stools -bone, muscle or joint pain -changes in vision -chest pain -heartburn or  stomach pain -jaw pain, especially after dental work -pain or trouble when swallowing -redness, blistering, peeling or loosening of the skin, including inside the mouth Side effects that usually do not require medical attention (report to your doctor or health care professional if they continue or are bothersome): -changes in taste -diarrhea or constipation -eye pain or itching -headache -nausea or vomiting -stomach gas or fullness This list may not describe all possible side effects. Call your doctor for medical advice about side effects. You may report side effects to FDA at 1-800-FDA-1088. Where should I keep my medicine? Keep out of the reach  of children. Store at room temperature of 15 and 30 degrees C (59 and 86 degrees F). Throw away any unused medicine after the expiration date. NOTE: This sheet is a summary. It may not cover all possible information. If you have questions about this medicine, talk to your doctor, pharmacist, or health care provider.    2016, Elsevier/Gold Standard. (2011-04-29 08:56:09)  Denosumab injection What is this medicine? DENOSUMAB (den oh sue mab) slows bone breakdown. Prolia is used to treat osteoporosis in women after menopause and in men. Delton See is used to prevent bone fractures and other bone problems caused by cancer bone metastases. Delton See is also used to treat giant cell tumor of the bone. This medicine may be used for other purposes; ask your health care provider or pharmacist if you have questions. What should I tell my health care provider before I take this medicine? They need to know if you have any of these conditions: -dental disease -eczema -infection or history of infections -kidney disease or on dialysis -low blood calcium or vitamin D -malabsorption syndrome -scheduled to have surgery or tooth extraction -taking medicine that contains denosumab -thyroid or parathyroid disease -an unusual reaction to denosumab, other medicines, foods, dyes, or preservatives -pregnant or trying to get pregnant -breast-feeding How should I use this medicine? This medicine is for injection under the skin. It is given by a health care professional in a hospital or clinic setting. If you are getting Prolia, a special MedGuide will be given to you by the pharmacist with each prescription and refill. Be sure to read this information carefully each time. For Prolia, talk to your pediatrician regarding the use of this medicine in children. Special care may be needed. For Delton See, talk to your pediatrician regarding the use of this medicine in children. While this drug may be prescribed for children as young  as 13 years for selected conditions, precautions do apply. Overdosage: If you think you have taken too much of this medicine contact a poison control center or emergency room at once. NOTE: This medicine is only for you. Do not share this medicine with others. What if I miss a dose? It is important not to miss your dose. Call your doctor or health care professional if you are unable to keep an appointment. What may interact with this medicine? Do not take this medicine with any of the following medications: -other medicines containing denosumab This medicine may also interact with the following medications: -medicines that suppress the immune system -medicines that treat cancer -steroid medicines like prednisone or cortisone This list may not describe all possible interactions. Give your health care provider a list of all the medicines, herbs, non-prescription drugs, or dietary supplements you use. Also tell them if you smoke, drink alcohol, or use illegal drugs. Some items may interact with your medicine. What should I watch for while using this medicine? Visit  your doctor or health care professional for regular checks on your progress. Your doctor or health care professional may order blood tests and other tests to see how you are doing. Call your doctor or health care professional if you get a cold or other infection while receiving this medicine. Do not treat yourself. This medicine may decrease your body's ability to fight infection. You should make sure you get enough calcium and vitamin D while you are taking this medicine, unless your doctor tells you not to. Discuss the foods you eat and the vitamins you take with your health care professional. See your dentist regularly. Brush and floss your teeth as directed. Before you have any dental work done, tell your dentist you are receiving this medicine. Do not become pregnant while taking this medicine or for 5 months after stopping it. Women  should inform their doctor if they wish to become pregnant or think they might be pregnant. There is a potential for serious side effects to an unborn child. Talk to your health care professional or pharmacist for more information. What side effects may I notice from receiving this medicine? Side effects that you should report to your doctor or health care professional as soon as possible: -allergic reactions like skin rash, itching or hives, swelling of the face, lips, or tongue -breathing problems -chest pain -fast, irregular heartbeat -feeling faint or lightheaded, falls -fever, chills, or any other sign of infection -muscle spasms, tightening, or twitches -numbness or tingling -skin blisters or bumps, or is dry, peels, or red -slow healing or unexplained pain in the mouth or jaw -unusual bleeding or bruising Side effects that usually do not require medical attention (Report these to your doctor or health care professional if they continue or are bothersome.): -muscle pain -stomach upset, gas This list may not describe all possible side effects. Call your doctor for medical advice about side effects. You may report side effects to FDA at 1-800-FDA-1088. Where should I keep my medicine? This medicine is only given in a clinic, doctor's office, or other health care setting and will not be stored at home. NOTE: This sheet is a summary. It may not cover all possible information. If you have questions about this medicine, talk to your doctor, pharmacist, or health care provider.    2016, Elsevier/Gold Standard. (2012-04-30 12:37:47)

## 2016-08-04 NOTE — Patient Instructions (Signed)
[facility] Discharge Instructions for Patients Receiving Chemotherapy   Beginning January 23rd 2017 lab work for the Cancer Center will be done in the  Main lab at  on 1st floor. If you have a lab appointment with the Cancer Center please come in thru the  Main Entrance and check in at the main information desk   Today you received the following chemotherapy agents Herceptin. Follow-up as scheduled. Call clinic for any questions or concerns  To help prevent nausea and vomiting after your treatment, we encourage you to take your nausea medication.   If you develop nausea and vomiting, or diarrhea that is not controlled by your medication, call the clinic.  The clinic phone number is [phone]. Office hours are Monday-Friday 8:30am-5:00pm.  BELOW ARE SYMPTOMS THAT SHOULD BE REPORTED IMMEDIATELY:  *FEVER GREATER THAN 101.0 F  *CHILLS WITH OR WITHOUT FEVER  NAUSEA AND VOMITING THAT IS NOT CONTROLLED WITH YOUR NAUSEA MEDICATION  *UNUSUAL SHORTNESS OF BREATH  *UNUSUAL BRUISING OR BLEEDING  TENDERNESS IN MOUTH AND THROAT WITH OR WITHOUT PRESENCE OF ULCERS  *URINARY PROBLEMS  *BOWEL PROBLEMS  UNUSUAL RASH Items with * indicate a potential emergency and should be followed up as soon as possible. If you have an emergency after office hours please contact your primary care physician or go to the nearest emergency department.  Please call the clinic during office hours if you have any questions or concerns.   You may also contact the Patient Navigator at [phone] should you have any questions or need assistance in obtaining follow up care.      Resources For Cancer Patients and their Caregivers ? American Cancer Society: Can assist with transportation, wigs, general needs, runs Look Good Feel Better.        1-888-227-6333 ? Cancer Care: Provides financial assistance, online support groups, medication/co-pay assistance.  1-800-813-HOPE  (4673) ? [facility] Assists [place] cancer patients and their families through emotional , educational and financial support.  336-427-4357 ? [place] DSS Where to apply for food stamps, Medicaid and utility assistance. 336-342-1394 ? RC[employer]: Transportation to medical appointments. 336-347-2287 ? Social Security Administration: May apply for disability if have a Stage IV cancer. 336-342-7796 1-800-772-1213 ? [place] Aging, Disability and Transit Services: Assists with nutrition, care and transit needs. [phone]         

## 2016-08-12 ENCOUNTER — Other Ambulatory Visit (HOSPITAL_COMMUNITY): Payer: Self-pay | Admitting: Hematology & Oncology

## 2016-08-14 ENCOUNTER — Encounter (HOSPITAL_COMMUNITY): Payer: Self-pay | Admitting: Hematology & Oncology

## 2016-08-25 ENCOUNTER — Encounter (HOSPITAL_COMMUNITY): Payer: Self-pay

## 2016-08-25 ENCOUNTER — Encounter (HOSPITAL_COMMUNITY): Payer: Medicare Other | Attending: Hematology & Oncology

## 2016-08-25 VITALS — BP 139/68 | HR 70 | Temp 98.8°F | Resp 18 | Wt 117.2 lb

## 2016-08-25 DIAGNOSIS — C50211 Malignant neoplasm of upper-inner quadrant of right female breast: Secondary | ICD-10-CM | POA: Diagnosis not present

## 2016-08-25 DIAGNOSIS — Z23 Encounter for immunization: Secondary | ICD-10-CM

## 2016-08-25 DIAGNOSIS — Z5112 Encounter for antineoplastic immunotherapy: Secondary | ICD-10-CM | POA: Diagnosis present

## 2016-08-25 DIAGNOSIS — Z170 Estrogen receptor positive status [ER+]: Secondary | ICD-10-CM

## 2016-08-25 LAB — CBC WITH DIFFERENTIAL/PLATELET
BASOS PCT: 2 %
Basophils Absolute: 0.2 10*3/uL — ABNORMAL HIGH (ref 0.0–0.1)
EOS PCT: 8 %
Eosinophils Absolute: 0.7 10*3/uL (ref 0.0–0.7)
HEMATOCRIT: 35.9 % — AB (ref 36.0–46.0)
Hemoglobin: 11.6 g/dL — ABNORMAL LOW (ref 12.0–15.0)
LYMPHS PCT: 24 %
Lymphs Abs: 2 10*3/uL (ref 0.7–4.0)
MCH: 30 pg (ref 26.0–34.0)
MCHC: 32.3 g/dL (ref 30.0–36.0)
MCV: 92.8 fL (ref 78.0–100.0)
MONO ABS: 0.6 10*3/uL (ref 0.1–1.0)
MONOS PCT: 7 %
NEUTROS ABS: 5 10*3/uL (ref 1.7–7.7)
Neutrophils Relative %: 59 %
PLATELETS: 288 10*3/uL (ref 150–400)
RBC: 3.87 MIL/uL (ref 3.87–5.11)
RDW: 13.7 % (ref 11.5–15.5)
WBC: 8.4 10*3/uL (ref 4.0–10.5)

## 2016-08-25 LAB — COMPREHENSIVE METABOLIC PANEL
ALT: 12 U/L — ABNORMAL LOW (ref 14–54)
ANION GAP: 7 (ref 5–15)
AST: 18 U/L (ref 15–41)
Albumin: 3.8 g/dL (ref 3.5–5.0)
Alkaline Phosphatase: 116 U/L (ref 38–126)
BILIRUBIN TOTAL: 1 mg/dL (ref 0.3–1.2)
BUN: 14 mg/dL (ref 6–20)
CO2: 24 mmol/L (ref 22–32)
Calcium: 9 mg/dL (ref 8.9–10.3)
Chloride: 106 mmol/L (ref 101–111)
Creatinine, Ser: 0.99 mg/dL (ref 0.44–1.00)
GFR calc Af Amer: >60 mL/min (ref >60)
GFR, EST NON AFRICAN AMERICAN: 55 mL/min — AB (ref >60)
Glucose, Bld: 114 mg/dL — ABNORMAL HIGH (ref 65–99)
POTASSIUM: 3.6 mmol/L (ref 3.5–5.1)
Sodium: 137 mmol/L (ref 135–145)
TOTAL PROTEIN: 6.5 g/dL (ref 6.5–8.1)

## 2016-08-25 MED ORDER — INFLUENZA VAC SPLIT QUAD 0.5 ML IM SUSY
0.5000 mL | PREFILLED_SYRINGE | Freq: Once | INTRAMUSCULAR | Status: AC
  Administered 2016-08-25: 0.5 mL via INTRAMUSCULAR
  Filled 2016-08-25: qty 0.5

## 2016-08-25 MED ORDER — HEPARIN SOD (PORK) LOCK FLUSH 100 UNIT/ML IV SOLN
500.0000 [IU] | Freq: Once | INTRAVENOUS | Status: AC | PRN
  Administered 2016-08-25: 500 [IU]
  Filled 2016-08-25: qty 5

## 2016-08-25 MED ORDER — DIPHENHYDRAMINE HCL 25 MG PO CAPS
50.0000 mg | ORAL_CAPSULE | Freq: Once | ORAL | Status: AC
  Administered 2016-08-25: 50 mg via ORAL

## 2016-08-25 MED ORDER — SODIUM CHLORIDE 0.9 % IV SOLN
Freq: Once | INTRAVENOUS | Status: AC
  Administered 2016-08-25: 12:00:00 via INTRAVENOUS

## 2016-08-25 MED ORDER — DIPHENHYDRAMINE HCL 25 MG PO CAPS
ORAL_CAPSULE | ORAL | Status: AC
  Filled 2016-08-25: qty 2

## 2016-08-25 MED ORDER — SODIUM CHLORIDE 0.9% FLUSH: 10.0000 mL | INTRAVENOUS | Status: DC | PRN

## 2016-08-25 MED ORDER — TRASTUZUMAB CHEMO 150 MG IV SOLR
6.0000 mg/kg | Freq: Once | INTRAVENOUS | Status: AC
  Administered 2016-08-25: 315 mg via INTRAVENOUS
  Filled 2016-08-25: qty 15

## 2016-08-25 MED ORDER — ACETAMINOPHEN 325 MG PO TABS
650.0000 mg | ORAL_TABLET | Freq: Once | ORAL | Status: AC
  Administered 2016-08-25: 650 mg via ORAL

## 2016-08-25 MED ORDER — ACETAMINOPHEN 325 MG PO TABS
ORAL_TABLET | ORAL | Status: AC
  Filled 2016-08-25: qty 2

## 2016-08-25 MED ORDER — SODIUM CHLORIDE 0.9 % IV SOLN
6.0000 mg/kg | Freq: Once | INTRAVENOUS | Status: DC
  Filled 2016-08-25: qty 15

## 2016-08-25 NOTE — Patient Instructions (Signed)
[facility] Discharge Instructions for Patients Receiving Chemotherapy   Beginning January 23rd 2017 lab work for the Cancer Center will be done in the  Main lab at [facility] on 1st floor. If you have a lab appointment with the Cancer Center please come in thru the  Main Entrance and check in at the main information desk   Today you received the following chemotherapy agents Herceptin. If you develop nausea and vomiting, or diarrhea that is not controlled by your medication, call the clinic.  The clinic phone number is [phone]. Office hours are Monday-Friday 8:30am-5:00pm.  BELOW ARE SYMPTOMS THAT SHOULD BE REPORTED IMMEDIATELY:  *FEVER GREATER THAN 101.0 F  *CHILLS WITH OR WITHOUT FEVER  NAUSEA AND VOMITING THAT IS NOT CONTROLLED WITH YOUR NAUSEA MEDICATION  *UNUSUAL SHORTNESS OF BREATH  *UNUSUAL BRUISING OR BLEEDING  TENDERNESS IN MOUTH AND THROAT WITH OR WITHOUT PRESENCE OF ULCERS  *URINARY PROBLEMS  *BOWEL PROBLEMS  UNUSUAL RASH Items with * indicate a potential emergency and should be followed up as soon as possible. If you have an emergency after office hours please contact your primary care physician or go to the nearest emergency department.  Please call the clinic during office hours if you have any questions or concerns.   You may also contact the Patient Navigator at [phone] should you have any questions or need assistance in obtaining follow up care.      Resources For Cancer Patients and their Caregivers ? American Cancer Society: Can assist with transportation, wigs, general needs, runs Look Good Feel Better.        1-888-227-6333 ? Cancer Care: Provides financial assistance, online support groups, medication/co-pay assistance.  1-800-813-HOPE (4673) ? [facility] Assists [place] cancer patients and their families through emotional , educational and financial support.   336-427-4357 ? [place] DSS Where to apply for food stamps, Medicaid and utility assistance. 336-342-1394 ? [facility]: Transportation to medical appointments. 336-347-2287 ? Social Security Administration: May apply for disability if have a Stage IV cancer. 336-342-7796 1-800-772-1213 ? [place] Aging, Disability and Transit Services: Assists with nutrition, care and transit needs. 336-349-2343         

## 2016-08-25 NOTE — Progress Notes (Signed)
Tolerated infusion w/o adverse reaction.  Alert, in no distress.  VSS.  Discharged ambulatory.  

## 2016-08-30 DIAGNOSIS — I10 Essential (primary) hypertension: Secondary | ICD-10-CM | POA: Diagnosis not present

## 2016-08-30 DIAGNOSIS — J471 Bronchiectasis with (acute) exacerbation: Secondary | ICD-10-CM | POA: Diagnosis not present

## 2016-08-31 DIAGNOSIS — Z1389 Encounter for screening for other disorder: Secondary | ICD-10-CM | POA: Diagnosis not present

## 2016-08-31 DIAGNOSIS — Z7189 Other specified counseling: Secondary | ICD-10-CM | POA: Diagnosis not present

## 2016-08-31 DIAGNOSIS — E1121 Type 2 diabetes mellitus with diabetic nephropathy: Secondary | ICD-10-CM | POA: Diagnosis not present

## 2016-08-31 DIAGNOSIS — N183 Chronic kidney disease, stage 3 (moderate): Secondary | ICD-10-CM | POA: Diagnosis not present

## 2016-08-31 DIAGNOSIS — C50911 Malignant neoplasm of unspecified site of right female breast: Secondary | ICD-10-CM | POA: Diagnosis not present

## 2016-08-31 DIAGNOSIS — M810 Age-related osteoporosis without current pathological fracture: Secondary | ICD-10-CM | POA: Diagnosis not present

## 2016-08-31 DIAGNOSIS — I129 Hypertensive chronic kidney disease with stage 1 through stage 4 chronic kidney disease, or unspecified chronic kidney disease: Secondary | ICD-10-CM | POA: Diagnosis not present

## 2016-08-31 DIAGNOSIS — Z0000 Encounter for general adult medical examination without abnormal findings: Secondary | ICD-10-CM | POA: Diagnosis not present

## 2016-08-31 DIAGNOSIS — Z7984 Long term (current) use of oral hypoglycemic drugs: Secondary | ICD-10-CM | POA: Diagnosis not present

## 2016-09-11 NOTE — Progress Notes (Signed)
Patient on plan of care prior to pathways. 

## 2016-09-15 ENCOUNTER — Encounter (HOSPITAL_COMMUNITY): Payer: Medicare Other | Attending: Hematology & Oncology

## 2016-09-15 ENCOUNTER — Encounter (HOSPITAL_BASED_OUTPATIENT_CLINIC_OR_DEPARTMENT_OTHER): Payer: Medicare Other | Admitting: Hematology & Oncology

## 2016-09-15 ENCOUNTER — Encounter (HOSPITAL_COMMUNITY): Payer: Self-pay | Admitting: Hematology & Oncology

## 2016-09-15 ENCOUNTER — Ambulatory Visit (HOSPITAL_COMMUNITY): Payer: Medicare Other | Admitting: Hematology & Oncology

## 2016-09-15 VITALS — BP 128/61 | HR 72 | Temp 98.0°F | Resp 16 | Wt 115.5 lb

## 2016-09-15 VITALS — BP 130/59 | HR 73 | Temp 98.1°F | Resp 18

## 2016-09-15 DIAGNOSIS — M8580 Other specified disorders of bone density and structure, unspecified site: Secondary | ICD-10-CM

## 2016-09-15 DIAGNOSIS — Z170 Estrogen receptor positive status [ER+]: Secondary | ICD-10-CM | POA: Diagnosis not present

## 2016-09-15 DIAGNOSIS — Z800 Family history of malignant neoplasm of digestive organs: Secondary | ICD-10-CM

## 2016-09-15 DIAGNOSIS — M810 Age-related osteoporosis without current pathological fracture: Secondary | ICD-10-CM

## 2016-09-15 DIAGNOSIS — C50211 Malignant neoplasm of upper-inner quadrant of right female breast: Secondary | ICD-10-CM | POA: Diagnosis not present

## 2016-09-15 DIAGNOSIS — Z803 Family history of malignant neoplasm of breast: Secondary | ICD-10-CM

## 2016-09-15 DIAGNOSIS — J479 Bronchiectasis, uncomplicated: Secondary | ICD-10-CM

## 2016-09-15 DIAGNOSIS — C50919 Malignant neoplasm of unspecified site of unspecified female breast: Secondary | ICD-10-CM

## 2016-09-15 DIAGNOSIS — Z806 Family history of leukemia: Secondary | ICD-10-CM

## 2016-09-15 DIAGNOSIS — Z5112 Encounter for antineoplastic immunotherapy: Secondary | ICD-10-CM | POA: Diagnosis present

## 2016-09-15 DIAGNOSIS — Z79811 Long term (current) use of aromatase inhibitors: Secondary | ICD-10-CM

## 2016-09-15 DIAGNOSIS — I10 Essential (primary) hypertension: Secondary | ICD-10-CM

## 2016-09-15 MED ORDER — DIPHENHYDRAMINE HCL 25 MG PO CAPS
50.0000 mg | ORAL_CAPSULE | Freq: Once | ORAL | Status: AC
  Administered 2016-09-15: 50 mg via ORAL
  Filled 2016-09-15: qty 2

## 2016-09-15 MED ORDER — TRASTUZUMAB CHEMO INJECTION 440 MG: 6.0000 mg/kg | Freq: Once | INTRAVENOUS | Status: DC

## 2016-09-15 MED ORDER — TRASTUZUMAB CHEMO 150 MG IV SOLR
300.0000 mg | Freq: Once | INTRAVENOUS | Status: AC
  Administered 2016-09-15: 300 mg via INTRAVENOUS
  Filled 2016-09-15: qty 14.29

## 2016-09-15 MED ORDER — ACETAMINOPHEN 325 MG PO TABS
650.0000 mg | ORAL_TABLET | Freq: Once | ORAL | Status: AC
  Administered 2016-09-15: 650 mg via ORAL
  Filled 2016-09-15: qty 2

## 2016-09-15 MED ORDER — SODIUM CHLORIDE 0.9 % IV SOLN
Freq: Once | INTRAVENOUS | Status: AC
  Administered 2016-09-15: 14:00:00 via INTRAVENOUS

## 2016-09-15 MED ORDER — HEPARIN SOD (PORK) LOCK FLUSH 100 UNIT/ML IV SOLN
500.0000 [IU] | Freq: Once | INTRAVENOUS | Status: AC | PRN
  Administered 2016-09-15: 500 [IU]

## 2016-09-15 MED ORDER — SODIUM CHLORIDE 0.9% FLUSH: 10.0000 mL | INTRAVENOUS | Status: DC | PRN

## 2016-09-15 NOTE — Progress Notes (Signed)
[place] at [facility] Progress Note  Patient Care Team: Lajean Manes, MD as PCP - General  CHIEF COMPLAINTS:  Invasive ductal carcinoma triple positive  ER+, PR+, HER 2 neu +  RIGHT breast cancer, upper inner quadrant Screening mammogram on 09/04/2015 with possible R breast mass 2 densities noted in the R breast upper inner quadrant within a centimeter of each other (9 and 10 cm from the nipple) 11/03/2015 partial mastectomy, sentinel node biopsy and port placement with Dr. Brantley Stage Final pathology [id number] ER+PR+ Her 2 neu+ R breast carcinoma    Breast cancer of upper-inner quadrant of right female breast ([place])   09/01/2015 Mammogram    BI-RADS CATEGORY 0: Incomplete. Need additional imaging evaluation and/or prior mammograms for comparison.      09/15/2015 Imaging    CT chest- Right lower lobe 4 mm solid pulmonary nodule. If the patient is at high risk for bronchogenic carcinoma, follow-up chest CT at 1 year is recommended.      09/16/2015 Breast US    US showing a small hypoechoic irregular mass with hyperechoic rim in the 1 o'clock location of R breast 10 cm from the nipple. Mass measures 0.7 x 0.5 x 0.6 cm. 2nd mass is identified in the 1 o'clock location 9 cm from the nipple 0.7 x 0.7 x 0.6 cm      09/16/2015 Mammogram    Two suspicious masses in the 1 o'clock location of the right breast warranting tissue diagnosis.  No evidence for adenopathy.      09/21/2015 Pathology Results    Estrogen Receptor: 100%, POSITIVE, STRONG STAINING INTENSITY Progesterone Receptor: 10%, POSITIVE, MODERATE STAINING INTENSITY Proliferation Marker Ki67: 10%.  HER2 - **POSITIVE**      09/21/2015 Mammogram    Appropriate positioning of the 2 biopsy marking clips in the upper-inner quadrant of the right breast at 1 o'clock.      09/21/2015 Initial Biopsy    Breast, right, needle core biopsy, 1:00 o'clock, 9 CMFN - INVASIVE DUCTAL CARCINOMA. - DUCTAL CARCINOMA IN SITU. - SEE  COMMENT. 2. Breast, right, needle core biopsy, 1:00 o'clock, 10 CMFN - INVASIVE DUCTAL CARCINOMA. - DUCTAL CARCINOMA IN SITU WITH       10/26/2015 Procedure    Right chest porta cath placed with no adverse features.- Dr. Brantley Stage.      11/03/2015 Pathology Results    MULTIFOCAL INVASIVE DUCTAL CARCINOMA.  INVASIVE TUMOR IS 1.0 MM FROM NEAREST MARGIN (INFERIOR).  HIGH GRADE DUCTAL CARCINOMA IN SITU WITH NECROSIS.  IN SITU CARCINOMA IS 0.1 MM FROM THE NEAREST MARGIN (INFERIOR MARGIN).      11/03/2015 Procedure    Right lumpectomy by Dr. Brantley Stage.      11/17/2015 Initial Diagnosis    Breast cancer of upper-inner quadrant of right female breast ([place])      11/21/2015 Imaging    Normal left ventricular ejection fraction equal to 71.9%.      11/26/2015 -  Chemotherapy    Paclitaxel weekly x 12       11/26/2015 -  Antibody Plan    Herceptin weekly with Paclitaxel.      02/15/2016 Echocardiogram    MUGA- Normal LEFT ventricular ejection fraction 65% slightly decreased from 72% on the previous exam.       04/18/2016 Imaging    MUGA- Normal left ventricular ejection fraction equal to 70.1%. This has increased from 65.2% previously.       07/26/2016 Echocardiogram    MUGA Left ventricular ejection fraction  equals 73 %       HISTORY OF PRESENTING ILLNESS:  Lori Greene 75 y.o. female is here for follow-up of  R breast cancer, disease is ER+, PR+,  HER 2 neu positive. She has completed weekly taxol/herceptin. She has completed. XRT and continuing with Q3 week herceptin.   Lori Greene has an appointment with Dr. Luan Pulling on 11/20.  She would like to schedule her next appointment the same day if possible secondary to ongoing family issues. She notes that her breathing is doing a little better.   Her sister has stage IV CRC and Lori Greene notes that she does not have long to live. This has been very difficult for her but she is accepting. She is taking all of her free time and traveling to be with her.   She  had flu shot and feel it made her sick.   She currently denies any problems with her breasts. NO nausea, no palpitations, no edema. Appetite is ok.  MEDICAL HISTORY:  Past Medical History:  Diagnosis Date  . Asthma   . Breast cancer ([place])    Right breast  . Cancer ([place]) 2016   right breast  . Diabetes mellitus without complication ([place])   . GERD (gastroesophageal reflux disease)   . Hypertension   . Radiation 03/23/16-05/09/16   45 Gy to right breast, boosted to 16 Gy    SURGICAL HISTORY: Past Surgical History:  Procedure Laterality Date  . ABDOMINAL HYSTERECTOMY  1983  . BREAST LUMPECTOMY WITH RADIOACTIVE SEED AND SENTINEL LYMPH NODE BIOPSY Right 11/03/2015   Procedure: RIGHT BREAST LUMPECTOMY WITH RADIOACTIVE SEED AND SENTINEL LYMPH NODE MAPPING;  Surgeon: Erroll Luna, MD;  Location: [place];  Service: General;  Laterality: Right;  . NASAL FRACTURE SURGERY    . PORTACATH PLACEMENT Right 11/03/2015   Procedure: INSERTION PORT-A-CATH;  Surgeon: Erroll Luna, MD;  Location: [place];  Service: General;  Laterality: Right;    SOCIAL HISTORY: Social History   Social History  . Marital status: Married    Spouse name: N/A  . Number of children: N/A  . Years of education: N/A   Occupational History  . retired    Social History Main Topics  . Smoking status: Never Smoker  . Smokeless tobacco: Never Used  . Alcohol use No  . Drug use: No  . Sexual activity: No   Other Topics Concern  . Not on file   Social History Narrative  . No narrative on file  Married 51 years. 0 children. Fostered children. 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 She likes to sew, knit, and quilt. She likes to walk but does not do it often. Non smoker ETOH, none She is from [facility] and moved to [place] when she got married.  FAMILY HISTORY: Family History  Problem Relation Age of Onset  . Breast cancer Mother   .  Leukemia Father    indicated that the status of her mother is unknown. She indicated that the status of her father is unknown.   Father died at 28 yo of leukemia Mother died of old age; breast cancer diagnosis at 74 yo. She had a biopsy, no treatment. 1 sister, healthy. No breast cancer. Sister with stage IV colon cancer  ALLERGIES:  is allergic to lisinopril; losartan; and penicillins.  MEDICATIONS:  Current Outpatient Prescriptions  Medication Sig Dispense Refill  . albuterol (PROAIR HFA) 108 (90 BASE) MCG/ACT inhaler Inhale 2 puffs into the  lungs every 6 (six) hours as needed for wheezing or shortness of breath.    Marland Kitchen amLODipine (NORVASC) 2.5 MG tablet Take 2.5 mg by mouth daily.    Marland Kitchen anastrozole (ARIMIDEX) 1 MG tablet Take 1 tablet (1 mg total) by mouth daily. 90 tablet 1  . atorvastatin (LIPITOR) 10 MG tablet Take 10 mg by mouth daily.    Marland Kitchen BREO ELLIPTA 100-25 MCG/INH AEPB     . Calcium Carbonate-Vitamin D (CALCIUM + D PO) Take 1 tablet by mouth every morning.    . Cholecalciferol (VITAMIN D3) 2000 units TABS Take by mouth.    . dextromethorphan-guaiFENesin (MUCINEX DM) 30-600 MG 12hr tablet Take 1 tablet by mouth daily as needed. Reported on 04/18/2016    . famotidine (PEPCID) 20 MG tablet Take 20 mg by mouth daily.     Marland Kitchen lidocaine-prilocaine (EMLA) cream Apply a quarter size amount to port site 1 hour prior to chemo. Do not rub in. Cover with plastic wrap. 30 g 3  . metFORMIN (GLUCOPHAGE-XR) 500 MG 24 hr tablet TAKE 1 TABLET (500 MG TOTAL) BY MOUTH DAILY WITH BREAKFAST. 30 tablet 3  . methylPREDNISolone (MEDROL DOSEPAK) 4 MG TBPK tablet Take by mouth daily. Use as directed 21 tablet 0  . montelukast (SINGULAIR) 10 MG tablet     . omeprazole (PRILOSEC) 40 MG capsule Take 40 mg by mouth daily.    . ondansetron (ZOFRAN) 8 MG tablet Take 1 tablet (8 mg total) by mouth every 8 (eight) hours as needed for nausea or vomiting. 30 tablet 2  . prochlorperazine (COMPAZINE) 10 MG tablet Take 1  tablet (10 mg total) by mouth every 6 (six) hours as needed for nausea or vomiting. 30 tablet 2  . Trastuzumab (HERCEPTIN IV) Inject into the vein. To start on November 26, 2015.    . valsartan (DIOVAN) 40 MG tablet TAKE 1 TABLET BY MOUTH EVERY DAY 30 tablet 1   No current facility-administered medications for this visit.     Review of Systems  Constitutional: Negative for chills, fever and malaise/fatigue.  HENT: Negative.  Negative for congestion, hearing loss, nosebleeds, sore throat and tinnitus.   Eyes: Negative.  Negative for blurred vision, double vision, pain and discharge.  Respiratory: Positive for shortness of breath. Negative for hemoptysis, sputum production and wheezing.        Chronic  Cardiovascular: Negative.  Negative for chest pain, palpitations, claudication, leg swelling and PND.  Gastrointestinal: Negative for abdominal pain, blood in stool, constipation, diarrhea, heartburn, melena and nausea.  Genitourinary: Negative.  Negative for dysuria, frequency, hematuria and urgency.  Musculoskeletal: Positive for joint pain. Negative for falls and myalgias.       Joint pain from aromatase inhibitor  Skin: Negative.  Negative for itching and rash.  Neurological: Negative for dizziness, tingling, tremors, sensory change, speech change, focal weakness, seizures, loss of consciousness, weakness and headaches.  Psychiatric/Behavioral: Negative for depression, memory loss, substance abuse and suicidal ideas. The patient is not nervous/anxious.   All other systems reviewed and are negative. 14 point ROS was done and is otherwise as detailed above or in HPI   PHYSICAL EXAMINATION: ECOG PERFORMANCE STATUS: 0 - Asymptomatic  Vitals with BMI 09/15/2016  Height   Weight 115 lbs 8 oz  BMI   Systolic 993  Diastolic 61  Pulse 72  Respirations 16   Physical Exam  Constitutional: She is oriented to person, place, and time and well-developed, well-nourished, and in no distress.    Wears glasses.  Noted hair growth.  HENT:  Head: Normocephalic and atraumatic.  Nose: Nose normal.  Mouth/Throat: Oropharynx is clear and moist. No oropharyngeal exudate.  Eyes: Conjunctivae and EOM are normal. Pupils are equal, round, and reactive to light. Right eye exhibits no discharge. Left eye exhibits no discharge. No scleral icterus.  Neck: Normal range of motion. Neck supple. No tracheal deviation present. No thyromegaly present.  Cardiovascular: Normal rate, regular rhythm and normal heart sounds.  Exam reveals no gallop and no friction rub.   No murmur heard. Pulmonary/Chest: Effort normal. She has no wheezes. She has no rales.  Abdominal: Soft. Bowel sounds are normal. She exhibits no distension and no mass. There is no tenderness. There is no rebound and no guarding.  Musculoskeletal: Normal range of motion. She exhibits no edema.  Lymphadenopathy:    She has no cervical adenopathy.  Neurological: She is alert and oriented to person, place, and time. She has normal reflexes. No cranial nerve deficit. Gait normal. Coordination normal.  Skin: Skin is warm and dry. No rash noted.  Psychiatric: Mood, memory, affect and judgment normal.  Nursing note and vitals reviewed.   LABORATORY DATA:  I have reviewed the data as listed Lab Results  Component Value Date   WBC 8.4 08/25/2016   HGB 11.6 (L) 08/25/2016   HCT 35.9 (L) 08/25/2016   MCV 92.8 08/25/2016   PLT 288 08/25/2016   CMP     Component Value Date/Time   NA 137 08/25/2016 1204   K 3.6 08/25/2016 1204   CL 106 08/25/2016 1204   CO2 24 08/25/2016 1204   GLUCOSE 114 (H) 08/25/2016 1204   BUN 14 08/25/2016 1204   CREATININE 0.99 08/25/2016 1204   CALCIUM 9.0 08/25/2016 1204   PROT 6.5 08/25/2016 1204   ALBUMIN 3.8 08/25/2016 1204   AST 18 08/25/2016 1204   ALT 12 (L) 08/25/2016 1204   ALKPHOS 116 08/25/2016 1204   BILITOT 1.0 08/25/2016 1204   GFRNONAA 55 (L) 08/25/2016 1204   GFRAA >60 08/25/2016 1204     RADIOGRAPHIC STUDIES: I have personally reviewed the radiological images as listed and agreed with the findings in the report. Study Result   CLINICAL DATA:  Breast cancer. Evaluate cardiac function in relation to chemotherapy.  EXAM: NUCLEAR MEDICINE CARDIAC BLOOD POOL IMAGING (MUGA)  TECHNIQUE: Cardiac multi-gated acquisition was performed at rest following intravenous injection of Tc-60mlabeled red blood cells.  RADIOPHARMACEUTICALS:  Twenty mCi Tc-946mDP in-vitro labeled red blood cells IV  COMPARISON:  Amy EGA scan, 04/18/2016, 02/15/2016, 11/19/2015  FINDINGS: No  focal wall motion abnormality of the left ventricle.  Calculated left ventricular ejection fraction equals 73%. Previously 70%.  IMPRESSION: Left ventricular ejection fraction equals 73 %.   Electronically Signed   By: StSuzy Bouchard.D.   On: 07/26/2016 15:53    PATHOLOGY:     ASSESSMENT & PLAN:  Invasive ductal carcinoma triple positive  ER+ (100%), PR+ (10%), HER 2 neu +  RIGHT breast cancer, upper inner quadrant Screening mammogram on 09/04/2015 with possible R breast mass 2 densities noted in the R breast upper inner quadrant within a centimeter of each other (9 and 10 cm from the nipple) 11/03/2015 partial mastectomy, sentinel node biopsy and port placement with Dr. CoBrantley Stageinal pathology [id number] ER+PR+ Her 2 neu+ R breast carcinoma Grade I chemotherapy induced neuropathy feet  Cough, wheezing Hypertension DEXA with osteoporosis Aromatase inhibitor related joint pain RLL pulmonary nodule Bronchiectasis  She is doing better from a  pulmonary perspective.She will continue to follow with Dr. Luan Pulling.  MUGA is up to date.   Goal again is to complete 52 weeks of herceptin. She will continue with ongoing therapy.   She has osteoporosis and will need bone directed therapy. I have discussed prolia, bisphosphonates.. She is reluctant to do any bone directed therapy. I  discussed changing to tamoxifen. She will be given information on prolia, bisphosphonates and we will continue to readdress at follow-up. She will need a repeat DEXA within one year of her last if she opts for no bone directed therapy.   Patient appears well. She is having a difficult time knowing that her sister will pass soon.   Will move Lori Greene's next appointment to the same day as Dr. Kathaleen Grinder, per patient request.  Follow up with patient on 11/20.  Orders Placed This Encounter  Procedures  . CBC with Differential    Standing Status:   Future    Standing Expiration Date:   09/15/2017  . Comprehensive metabolic panel    Standing Status:   Future    Standing Expiration Date:   09/15/2017     All questions were answered. The patient knows to call the clinic with any problems, questions or concerns.  This note was electronically signed.   This document serves as a record of services personally performed by Ancil Linsey, MD. It was created on her behalf by Elmyra Ricks, a trained medical scribe. The creation of this record is based on the scribe's personal observations and the provider's statements to them. This document has been checked and approved by the attending provider.   I have reviewed the above documentation for accuracy and completeness, and I agree with the above. Molli Hazard, MD  09/15/2016 8:27 AM

## 2016-09-15 NOTE — Progress Notes (Signed)
Tolerated infusion w/o adverse reaction.  Alert, in no distress.  VSS.  Discharged ambulatory.  

## 2016-09-15 NOTE — Patient Instructions (Addendum)
[place] at [facility] Discharge Instructions  RECOMMENDATIONS MADE BY THE CONSULTANT AND ANY TEST RESULTS WILL BE SENT TO YOUR REFERRING PHYSICIAN.  You saw Dr.Penland today.  Your next herceptin will be moved to 11/20.  Follow up in 6 weeks or so with MD appt, chemo and labs.  See Amy at checkout for appointments.  Thank you for choosing [place] at [facility] to provide your oncology and hematology care.  To afford each patient quality time with our provider, please arrive at least 15 minutes before your scheduled appointment time.   Beginning January 23rd 2017 lab work for the [facility] will be done in the  Main lab at [facility] on 1st floor. If you have a lab appointment with the [place] please come in thru the  Main Entrance and check in at the main information desk  You need to re-schedule your appointment should you arrive 10 or more minutes late.  We strive to give you quality time with our providers, and arriving late affects you and other patients whose appointments are after yours.  Also, if you no show three or more times for appointments you may be dismissed from the clinic at the providers discretion.     Again, thank you for choosing [facility].  Our hope is that these requests will decrease the amount of time that you wait before being seen by our physicians.       _____________________________________________________________  Should you have questions after your visit to [facility], please contact our office at [phone] between the hours of 8:30 a.m. and 4:30 p.m.  Voicemails left after 4:30 p.m. will not be returned until the following business day.  For prescription refill requests, have your pharmacy contact our office.         Resources For Cancer Patients and their Caregivers ? American Cancer Society: Can assist with transportation, wigs, general needs, runs Look  Good Feel Better.        838-411-9366 ? Cancer Care: Provides financial assistance, online support groups, medication/co-pay assistance.  1-800-813-HOPE (680) 879-4497) ? [place] Assists [place] cancer patients and their families through emotional , educational and financial support.  (938)829-1153 ? [place] DSS Where to apply for food stamps, Medicaid and utility assistance. 972-039-4785 ? RCATS: Transportation to medical appointments. (914) 339-2974 ? Social Security Administration: May apply for disability if have a Stage IV cancer. 616-884-1716 413-830-6846 ? [employer], Disability and Transit Services: Assists with nutrition, care and transit needs. [place] Support Programs: @10RELATIVEDAYS @ > Cancer Support Group  2nd Tuesday of the month 1pm-2pm, Journey Room  > Creative Journey  3rd Tuesday of the month 1130am-1pm, Journey Room  > Look Good Feel Better  1st Wednesday of the month 10am-12 noon, Journey Room (Call [place] to register [phone])

## 2016-09-15 NOTE — Patient Instructions (Signed)
[facility] Discharge Instructions for Patients Receiving Chemotherapy   Beginning January 23rd 2017 lab work for the Cancer Center will be done in the  Main lab at [facility] on 1st floor. If you have a lab appointment with the Cancer Center please come in thru the  Main Entrance and check in at the main information desk   Today you received the following chemotherapy agents Herceptin. If you develop nausea and vomiting, or diarrhea that is not controlled by your medication, call the clinic.  The clinic phone number is [phone]. Office hours are Monday-Friday 8:30am-5:00pm.  BELOW ARE SYMPTOMS THAT SHOULD BE REPORTED IMMEDIATELY:  *FEVER GREATER THAN 101.0 F  *CHILLS WITH OR WITHOUT FEVER  NAUSEA AND VOMITING THAT IS NOT CONTROLLED WITH YOUR NAUSEA MEDICATION  *UNUSUAL SHORTNESS OF BREATH  *UNUSUAL BRUISING OR BLEEDING  TENDERNESS IN MOUTH AND THROAT WITH OR WITHOUT PRESENCE OF ULCERS  *URINARY PROBLEMS  *BOWEL PROBLEMS  UNUSUAL RASH Items with * indicate a potential emergency and should be followed up as soon as possible. If you have an emergency after office hours please contact your primary care physician or go to the nearest emergency department.  Please call the clinic during office hours if you have any questions or concerns.   You may also contact the Patient Navigator at [phone] should you have any questions or need assistance in obtaining follow up care.      Resources For Cancer Patients and their Caregivers ? American Cancer Society: Can assist with transportation, wigs, general needs, runs Look Good Feel Better.        1-888-227-6333 ? Cancer Care: Provides financial assistance, online support groups, medication/co-pay assistance.  1-800-813-HOPE (4673) ? [facility] Assists [place] cancer patients and their families through emotional , educational and financial support.   336-427-4357 ? [place] DSS Where to apply for food stamps, Medicaid and utility assistance. 336-342-1394 ? RCATS: Transportation to medical appointments. 336-347-2287 ? Social Security Administration: May apply for disability if have a Stage IV cancer. 336-342-7796 1-800-772-1213 ? [place] Aging, Disability and Transit Services: Assists with nutrition, care and transit needs. 336-349-2343         

## 2016-09-27 DIAGNOSIS — M79671 Pain in right foot: Secondary | ICD-10-CM | POA: Diagnosis not present

## 2016-09-27 DIAGNOSIS — M7989 Other specified soft tissue disorders: Secondary | ICD-10-CM | POA: Diagnosis not present

## 2016-09-27 DIAGNOSIS — M2011 Hallux valgus (acquired), right foot: Secondary | ICD-10-CM | POA: Diagnosis not present

## 2016-10-03 ENCOUNTER — Encounter (HOSPITAL_BASED_OUTPATIENT_CLINIC_OR_DEPARTMENT_OTHER): Payer: Medicare Other

## 2016-10-03 VITALS — BP 151/60 | HR 69 | Temp 97.6°F | Resp 18 | Wt 117.2 lb

## 2016-10-03 DIAGNOSIS — R911 Solitary pulmonary nodule: Secondary | ICD-10-CM | POA: Diagnosis not present

## 2016-10-03 DIAGNOSIS — Z170 Estrogen receptor positive status [ER+]: Secondary | ICD-10-CM | POA: Diagnosis not present

## 2016-10-03 DIAGNOSIS — C50211 Malignant neoplasm of upper-inner quadrant of right female breast: Secondary | ICD-10-CM | POA: Diagnosis not present

## 2016-10-03 DIAGNOSIS — J471 Bronchiectasis with (acute) exacerbation: Secondary | ICD-10-CM | POA: Diagnosis not present

## 2016-10-03 DIAGNOSIS — Z5112 Encounter for antineoplastic immunotherapy: Secondary | ICD-10-CM

## 2016-10-03 DIAGNOSIS — I10 Essential (primary) hypertension: Secondary | ICD-10-CM | POA: Diagnosis not present

## 2016-10-03 DIAGNOSIS — C50919 Malignant neoplasm of unspecified site of unspecified female breast: Secondary | ICD-10-CM | POA: Diagnosis not present

## 2016-10-03 LAB — CBC WITH DIFFERENTIAL/PLATELET
BASOS PCT: 1 %
Basophils Absolute: 0.1 10*3/uL (ref 0.0–0.1)
EOS ABS: 0.9 10*3/uL — AB (ref 0.0–0.7)
EOS PCT: 12 %
HCT: 38.1 % (ref 36.0–46.0)
Hemoglobin: 12.3 g/dL (ref 12.0–15.0)
LYMPHS ABS: 2.2 10*3/uL (ref 0.7–4.0)
Lymphocytes Relative: 28 %
MCH: 30 pg (ref 26.0–34.0)
MCHC: 32.3 g/dL (ref 30.0–36.0)
MCV: 92.9 fL (ref 78.0–100.0)
Monocytes Absolute: 0.6 10*3/uL (ref 0.1–1.0)
Monocytes Relative: 8 %
Neutro Abs: 4.1 10*3/uL (ref 1.7–7.7)
Neutrophils Relative %: 52 %
PLATELETS: 267 10*3/uL (ref 150–400)
RBC: 4.1 MIL/uL (ref 3.87–5.11)
RDW: 13.2 % (ref 11.5–15.5)
WBC: 7.8 10*3/uL (ref 4.0–10.5)

## 2016-10-03 LAB — COMPREHENSIVE METABOLIC PANEL
ALBUMIN: 3.6 g/dL (ref 3.5–5.0)
ALT: 13 U/L — AB (ref 14–54)
ANION GAP: 5 (ref 5–15)
AST: 18 U/L (ref 15–41)
Alkaline Phosphatase: 121 U/L (ref 38–126)
BUN: 12 mg/dL (ref 6–20)
CHLORIDE: 106 mmol/L (ref 101–111)
CO2: 26 mmol/L (ref 22–32)
Calcium: 9.1 mg/dL (ref 8.9–10.3)
Creatinine, Ser: 1.06 mg/dL — ABNORMAL HIGH (ref 0.44–1.00)
GFR calc non Af Amer: 50 mL/min — ABNORMAL LOW (ref >60)
GFR, EST AFRICAN AMERICAN: 58 mL/min — AB (ref >60)
Glucose, Bld: 153 mg/dL — ABNORMAL HIGH (ref 65–99)
Potassium: 3.9 mmol/L (ref 3.5–5.1)
SODIUM: 137 mmol/L (ref 135–145)
Total Bilirubin: 0.7 mg/dL (ref 0.3–1.2)
Total Protein: 6.4 g/dL — ABNORMAL LOW (ref 6.5–8.1)

## 2016-10-03 MED ORDER — SODIUM CHLORIDE 0.9 % IV SOLN
Freq: Once | INTRAVENOUS | Status: AC
  Administered 2016-10-03: 12:00:00 via INTRAVENOUS

## 2016-10-03 MED ORDER — SODIUM CHLORIDE 0.9% FLUSH
10.0000 mL | INTRAVENOUS | Status: DC | PRN
  Administered 2016-10-03: 10 mL
  Filled 2016-10-03: qty 10

## 2016-10-03 MED ORDER — HEPARIN SOD (PORK) LOCK FLUSH 100 UNIT/ML IV SOLN
500.0000 [IU] | Freq: Once | INTRAVENOUS | Status: AC | PRN
Start: 2016-10-03 — End: 2016-10-03
  Administered 2016-10-03: 500 [IU]
  Filled 2016-10-03: qty 5

## 2016-10-03 MED ORDER — ACETAMINOPHEN 325 MG PO TABS
650.0000 mg | ORAL_TABLET | Freq: Once | ORAL | Status: AC
  Administered 2016-10-03: 650 mg via ORAL
  Filled 2016-10-03: qty 2

## 2016-10-03 MED ORDER — SODIUM CHLORIDE 0.9 % IV SOLN
300.0000 mg | Freq: Once | INTRAVENOUS | Status: AC
  Administered 2016-10-03: 300 mg via INTRAVENOUS
  Filled 2016-10-03: qty 14.29

## 2016-10-03 MED ORDER — DIPHENHYDRAMINE HCL 25 MG PO CAPS
50.0000 mg | ORAL_CAPSULE | Freq: Once | ORAL | Status: AC
  Administered 2016-10-03: 50 mg via ORAL
  Filled 2016-10-03: qty 2

## 2016-10-03 NOTE — Progress Notes (Signed)
Jamelyn R Wentzell tolerated chemo tx well without complaints or incident. VSS upon discharge. Labs reviewed prior to discharge. Pt discharged self ambulatory in satisfactory condition

## 2016-10-03 NOTE — Patient Instructions (Signed)
[facility] Discharge Instructions for Patients Receiving Chemotherapy   Beginning January 23rd 2017 lab work for the Cancer Center will be done in the  Main lab at [facility] on 1st floor. If you have a lab appointment with the Cancer Center please come in thru the  Main Entrance and check in at the main information desk   Today you received the following chemotherapy agents Herceptin. Follow-up as scheduled. Call clinic for any questions or concerns  To help prevent nausea and vomiting after your treatment, we encourage you to take your nausea medication.   If you develop nausea and vomiting, or diarrhea that is not controlled by your medication, call the clinic.  The clinic phone number is [phone]. Office hours are Monday-Friday 8:30am-5:00pm.  BELOW ARE SYMPTOMS THAT SHOULD BE REPORTED IMMEDIATELY:  *FEVER GREATER THAN 101.0 F  *CHILLS WITH OR WITHOUT FEVER  NAUSEA AND VOMITING THAT IS NOT CONTROLLED WITH YOUR NAUSEA MEDICATION  *UNUSUAL SHORTNESS OF BREATH  *UNUSUAL BRUISING OR BLEEDING  TENDERNESS IN MOUTH AND THROAT WITH OR WITHOUT PRESENCE OF ULCERS  *URINARY PROBLEMS  *BOWEL PROBLEMS  UNUSUAL RASH Items with * indicate a potential emergency and should be followed up as soon as possible. If you have an emergency after office hours please contact your primary care physician or go to the nearest emergency department.  Please call the clinic during office hours if you have any questions or concerns.   You may also contact the Patient Navigator at [phone] should you have any questions or need assistance in obtaining follow up care.      Resources For Cancer Patients and their Caregivers ? American Cancer Society: Can assist with transportation, wigs, general needs, runs Look Good Feel Better.        1-888-227-6333 ? Cancer Care: Provides financial assistance, online support groups, medication/co-pay assistance.  1-800-813-HOPE  (4673) ? [facility] Assists [place] cancer patients and their families through emotional , educational and financial support.  336-427-4357 ? [place] DSS Where to apply for food stamps, Medicaid and utility assistance. 336-342-1394 ? RCATS: Transportation to medical appointments. 336-347-2287 ? Social Security Administration: May apply for disability if have a Stage IV cancer. 336-342-7796 1-800-772-1213 ? [place] Aging, Disability and Transit Services: Assists with nutrition, care and transit needs. 336-349-2343         

## 2016-10-10 ENCOUNTER — Encounter (HOSPITAL_COMMUNITY): Payer: Self-pay | Admitting: Hematology & Oncology

## 2016-10-12 ENCOUNTER — Other Ambulatory Visit (HOSPITAL_COMMUNITY): Payer: Self-pay | Admitting: Hematology & Oncology

## 2016-10-12 ENCOUNTER — Other Ambulatory Visit (HOSPITAL_COMMUNITY): Payer: Self-pay | Admitting: Emergency Medicine

## 2016-10-12 MED ORDER — AZITHROMYCIN 250 MG PO TABS: ORAL_TABLET | ORAL | 0 refills | Status: DC

## 2016-10-24 ENCOUNTER — Encounter (HOSPITAL_COMMUNITY): Payer: Self-pay | Admitting: Oncology

## 2016-10-24 ENCOUNTER — Encounter (HOSPITAL_COMMUNITY): Payer: Self-pay

## 2016-10-24 ENCOUNTER — Encounter (HOSPITAL_BASED_OUTPATIENT_CLINIC_OR_DEPARTMENT_OTHER): Payer: Medicare Other

## 2016-10-24 ENCOUNTER — Encounter (HOSPITAL_COMMUNITY): Payer: Medicare Other | Attending: Hematology & Oncology | Admitting: Oncology

## 2016-10-24 VITALS — BP 132/69 | HR 78 | Temp 98.0°F | Resp 18

## 2016-10-24 DIAGNOSIS — C50211 Malignant neoplasm of upper-inner quadrant of right female breast: Secondary | ICD-10-CM | POA: Insufficient documentation

## 2016-10-24 DIAGNOSIS — E119 Type 2 diabetes mellitus without complications: Secondary | ICD-10-CM | POA: Insufficient documentation

## 2016-10-24 DIAGNOSIS — Z170 Estrogen receptor positive status [ER+]: Principal | ICD-10-CM

## 2016-10-24 DIAGNOSIS — Z5112 Encounter for antineoplastic immunotherapy: Secondary | ICD-10-CM | POA: Diagnosis present

## 2016-10-24 DIAGNOSIS — J45909 Unspecified asthma, uncomplicated: Secondary | ICD-10-CM | POA: Insufficient documentation

## 2016-10-24 LAB — CBC WITH DIFFERENTIAL/PLATELET
BASOS ABS: 0.1 10*3/uL (ref 0.0–0.1)
Basophils Relative: 1 %
EOS ABS: 1.1 10*3/uL — AB (ref 0.0–0.7)
Eosinophils Relative: 14 %
HEMATOCRIT: 36.6 % (ref 36.0–46.0)
HEMOGLOBIN: 11.9 g/dL — AB (ref 12.0–15.0)
Lymphocytes Relative: 31 %
Lymphs Abs: 2.4 10*3/uL (ref 0.7–4.0)
MCH: 30.1 pg (ref 26.0–34.0)
MCHC: 32.5 g/dL (ref 30.0–36.0)
MCV: 92.4 fL (ref 78.0–100.0)
MONOS PCT: 8 %
Monocytes Absolute: 0.6 10*3/uL (ref 0.1–1.0)
NEUTROS ABS: 3.6 10*3/uL (ref 1.7–7.7)
NEUTROS PCT: 46 %
Platelets: 268 10*3/uL (ref 150–400)
RBC: 3.96 MIL/uL (ref 3.87–5.11)
RDW: 13 % (ref 11.5–15.5)
WBC: 7.8 10*3/uL (ref 4.0–10.5)

## 2016-10-24 LAB — COMPREHENSIVE METABOLIC PANEL
ALBUMIN: 3.6 g/dL (ref 3.5–5.0)
ALK PHOS: 109 U/L (ref 38–126)
ALT: 12 U/L — ABNORMAL LOW (ref 14–54)
ANION GAP: 7 (ref 5–15)
AST: 18 U/L (ref 15–41)
BUN: 10 mg/dL (ref 6–20)
CALCIUM: 8.9 mg/dL (ref 8.9–10.3)
CO2: 24 mmol/L (ref 22–32)
Chloride: 108 mmol/L (ref 101–111)
Creatinine, Ser: 1.06 mg/dL — ABNORMAL HIGH (ref 0.44–1.00)
GFR calc Af Amer: 58 mL/min — ABNORMAL LOW (ref >60)
GFR calc non Af Amer: 50 mL/min — ABNORMAL LOW (ref >60)
GLUCOSE: 153 mg/dL — AB (ref 65–99)
POTASSIUM: 3.9 mmol/L (ref 3.5–5.1)
SODIUM: 139 mmol/L (ref 135–145)
Total Bilirubin: 1 mg/dL (ref 0.3–1.2)
Total Protein: 6.3 g/dL — ABNORMAL LOW (ref 6.5–8.1)

## 2016-10-24 MED ORDER — DIPHENHYDRAMINE HCL 25 MG PO CAPS
ORAL_CAPSULE | ORAL | Status: AC
  Filled 2016-10-24: qty 2

## 2016-10-24 MED ORDER — TRASTUZUMAB CHEMO 150 MG IV SOLR
300.0000 mg | Freq: Once | INTRAVENOUS | Status: AC
  Administered 2016-10-24: 300 mg via INTRAVENOUS
  Filled 2016-10-24: qty 14.29

## 2016-10-24 MED ORDER — ACETAMINOPHEN 325 MG PO TABS
ORAL_TABLET | ORAL | Status: AC
  Filled 2016-10-24: qty 2

## 2016-10-24 MED ORDER — HEPARIN SOD (PORK) LOCK FLUSH 100 UNIT/ML IV SOLN
500.0000 [IU] | Freq: Once | INTRAVENOUS | Status: AC | PRN
  Administered 2016-10-24: 500 [IU]
  Filled 2016-10-24: qty 5

## 2016-10-24 MED ORDER — SODIUM CHLORIDE 0.9 % IV SOLN
Freq: Once | INTRAVENOUS | Status: AC
  Administered 2016-10-24: 12:00:00 via INTRAVENOUS

## 2016-10-24 MED ORDER — SODIUM CHLORIDE 0.9% FLUSH: 10.0000 mL | INTRAVENOUS | Status: DC | PRN

## 2016-10-24 MED ORDER — ACETAMINOPHEN 325 MG PO TABS
650.0000 mg | ORAL_TABLET | Freq: Once | ORAL | Status: AC
  Administered 2016-10-24: 650 mg via ORAL

## 2016-10-24 MED ORDER — DIPHENHYDRAMINE HCL 25 MG PO CAPS
50.0000 mg | ORAL_CAPSULE | Freq: Once | ORAL | Status: AC
  Administered 2016-10-24: 50 mg via ORAL

## 2016-10-24 NOTE — Patient Instructions (Signed)
[facility] at [facility] Discharge Instructions  RECOMMENDATIONS MADE BY THE CONSULTANT AND ANY TEST RESULTS WILL BE SENT TO YOUR REFERRING PHYSICIAN.    Thank you for choosing [facility] at [facility] to provide your oncology and hematology care.  To afford each patient quality time with our provider, please arrive at least 15 minutes before your scheduled appointment time.   Beginning January 23rd 2017 lab work for the Cancer Center will be done in the  Main lab at [place] on 1st floor. If you have a lab appointment with the Cancer Center please come in thru the  Main Entrance and check in at the main information desk  You need to re-schedule your appointment should you arrive 10 or more minutes late.  We strive to give you quality time with our providers, and arriving late affects you and other patients whose appointments are after yours.  Also, if you no show three or more times for appointments you may be dismissed from the clinic at the providers discretion.     Again, thank you for choosing [facility].  Our hope is that these requests will decrease the amount of time that you wait before being seen by our physicians.       _____________________________________________________________  Should you have questions after your visit to [facility], please contact our office at [phone] between the hours of 8:30 a.m. and 4:30 p.m.  Voicemails left after 4:30 p.m. will not be returned until the following business day.  For prescription refill requests, have your pharmacy contact our office.         Resources For Cancer Patients and their Caregivers ? American Cancer Society: Can assist with transportation, wigs, general needs, runs Look Good Feel Better.        1-888-227-6333 ? Cancer Care: Provides financial assistance, online support groups, medication/co-pay assistance.  1-800-813-HOPE (4673) ? [facility] Assists [place] cancer patients and their families through emotional , educational and financial support.  336-427-4357 ? [place] DSS Where to apply for food stamps, Medicaid and utility assistance. 336-342-1394 ? RCATS: Transportation to medical appointments. 336-347-2287 ? Social Security Administration: May apply for disability if have a Stage IV cancer. 336-342-7796 1-800-772-1213 ? [place] Aging, Disability and Transit Services: Assists with nutrition, care and transit needs. 336-349-2343  Cancer Center Support Programs: @10RELATIVEDAYS@ > Cancer Support Group  2nd Tuesday of the month 1pm-2pm, Journey Room  > Creative Journey  3rd Tuesday of the month 1130am-1pm, Journey Room  > Look Good Feel Better  1st Wednesday of the month 10am-12 noon, Journey Room (Call American Cancer Society to register 1-800-395-5775)   

## 2016-10-24 NOTE — Patient Instructions (Signed)
[place] at [facility] Discharge Instructions  RECOMMENDATIONS MADE BY THE CONSULTANT AND ANY TEST RESULTS WILL BE SENT TO YOUR REFERRING PHYSICIAN.  MUGA scan this week  Return in 3 weeks for Herceptin and to see doctor    Thank you for choosing [place] at [facility] to provide your oncology and hematology care.  To afford each patient quality time with our provider, please arrive at least 15 minutes before your scheduled appointment time.   Beginning January 23rd 2017 lab work for the [facility] will be done in the  Main lab at [facility] on 1st floor. If you have a lab appointment with the [place] please come in thru the  Main Entrance and check in at the main information desk  You need to re-schedule your appointment should you arrive 10 or more minutes late.  We strive to give you quality time with our providers, and arriving late affects you and other patients whose appointments are after yours.  Also, if you no show three or more times for appointments you may be dismissed from the clinic at the providers discretion.     Again, thank you for choosing [facility].  Our hope is that these requests will decrease the amount of time that you wait before being seen by our physicians.       _____________________________________________________________  Should you have questions after your visit to [facility], please contact our office at [phone] between the hours of 8:30 a.m. and 4:30 p.m.  Voicemails left after 4:30 p.m. will not be returned until the following business day.  For prescription refill requests, have your pharmacy contact our office.         Resources For Cancer Patients and their Caregivers ? American Cancer Society: Can assist with transportation, wigs, general needs, runs Look Good Feel Better.        506-814-1838 ? Cancer Care: Provides financial assistance, online  support groups, medication/co-pay assistance.  1-800-813-HOPE (404) 349-4386) ? [facility] Assists [place] cancer patients and their families through emotional , educational and financial support.  819-198-8018 ? [place] DSS Where to apply for food stamps, Medicaid and utility assistance. 251-432-7680 ? RCATS: Transportation to medical appointments. 978-262-0739 ? Social Security Administration: May apply for disability if have a Stage IV cancer. 3314628186 978-016-3041 ? [employer], Disability and Transit Services: Assists with nutrition, care and transit needs. [place] Support Programs: @10RELATIVEDAYS @ > Cancer Support Group  2nd Tuesday of the month 1pm-2pm, Journey Room  > Creative Journey  3rd Tuesday of the month 1130am-1pm, Journey Room  > Look Good Feel Better  1st Wednesday of the month 10am-12 noon, Journey Room (Call [place] to register [phone])

## 2016-10-24 NOTE — Progress Notes (Signed)
[place] at [facility] Progress Note  Patient Care Team: Lajean Manes, MD as PCP - General  CHIEF COMPLAINTS:  Invasive ductal carcinoma triple positive  ER+, PR+, HER 2 neu +  RIGHT breast cancer, upper inner quadrant Screening mammogram on 09/04/2015 with possible R breast mass 2 densities noted in the R breast upper inner quadrant within a centimeter of each other (9 and 10 cm from the nipple) 11/03/2015 partial mastectomy, sentinel node biopsy and port placement with Dr. Brantley Stage Final pathology [id number] ER+PR+ Her 2 neu+ R breast carcinoma    Breast cancer of upper-inner quadrant of right female breast ([place])   09/01/2015 Mammogram    BI-RADS CATEGORY 0: Incomplete. Need additional imaging evaluation and/or prior mammograms for comparison.      09/15/2015 Imaging    CT chest- Right lower lobe 4 mm solid pulmonary nodule. If the patient is at high risk for bronchogenic carcinoma, follow-up chest CT at 1 year is recommended.      09/16/2015 Breast US    US showing a small hypoechoic irregular mass with hyperechoic rim in the 1 o'clock location of R breast 10 cm from the nipple. Mass measures 0.7 x 0.5 x 0.6 cm. 2nd mass is identified in the 1 o'clock location 9 cm from the nipple 0.7 x 0.7 x 0.6 cm      09/16/2015 Mammogram    Two suspicious masses in the 1 o'clock location of the right breast warranting tissue diagnosis.  No evidence for adenopathy.      09/21/2015 Pathology Results    Estrogen Receptor: 100%, POSITIVE, STRONG STAINING INTENSITY Progesterone Receptor: 10%, POSITIVE, MODERATE STAINING INTENSITY Proliferation Marker Ki67: 10%.  HER2 - **POSITIVE**      09/21/2015 Mammogram    Appropriate positioning of the 2 biopsy marking clips in the upper-inner quadrant of the right breast at 1 o'clock.      09/21/2015 Initial Biopsy    Breast, right, needle core biopsy, 1:00 o'clock, 9 CMFN - INVASIVE DUCTAL CARCINOMA. - DUCTAL CARCINOMA IN SITU. - SEE  COMMENT. 2. Breast, right, needle core biopsy, 1:00 o'clock, 10 CMFN - INVASIVE DUCTAL CARCINOMA. - DUCTAL CARCINOMA IN SITU WITH       10/26/2015 Procedure    Right chest porta cath placed with no adverse features.- Dr. Brantley Stage.      11/03/2015 Pathology Results    MULTIFOCAL INVASIVE DUCTAL CARCINOMA.  INVASIVE TUMOR IS 1.0 MM FROM NEAREST MARGIN (INFERIOR).  HIGH GRADE DUCTAL CARCINOMA IN SITU WITH NECROSIS.  IN SITU CARCINOMA IS 0.1 MM FROM THE NEAREST MARGIN (INFERIOR MARGIN).      11/03/2015 Procedure    Right lumpectomy by Dr. Brantley Stage.      11/17/2015 Initial Diagnosis    Breast cancer of upper-inner quadrant of right female breast ([place])      11/21/2015 Imaging    Normal left ventricular ejection fraction equal to 71.9%.      11/26/2015 -  Chemotherapy    Paclitaxel weekly x 12       11/26/2015 -  Antibody Plan    Herceptin weekly with Paclitaxel.      02/15/2016 Echocardiogram    MUGA- Normal LEFT ventricular ejection fraction 65% slightly decreased from 72% on the previous exam.       04/18/2016 Imaging    MUGA- Normal left ventricular ejection fraction equal to 70.1%. This has increased from 65.2% previously.       07/26/2016 Echocardiogram    MUGA Left ventricular ejection fraction  equals 73 %       HISTORY OF PRESENTING ILLNESS:  Lori Greene 75 y.o. female is here for follow-up of  R breast cancer, disease is ER+, PR+,  HER 2 neu positive. She has completed weekly taxol/herceptin. She has completed. XRT and continuing with Q3 week herceptin.   Lori Greene has an appointment with Dr. Luan Pulling on 11/20.  She would like to schedule her next appointment the same day if possible secondary to ongoing family issues. She notes that her breathing is doing a little better.   Her sister has stage IV CRC and Lori Greene notes that she does not have long to live. This has been very difficult for her but she is accepting. She is taking all of her free time and traveling to be with her.   She  had some common cold.  Chills fever.  No hospitalizations or emergency room visit since last evaluation  She currently denies any problems with her breasts. NO nausea, no palpitations, no edema. Appetite is ok.  MEDICAL HISTORY:  Past Medical History:  Diagnosis Date  . Asthma   . Breast cancer ([place])    Right breast  . Cancer ([place]) 2016   right breast  . Diabetes mellitus without complication ([place])   . GERD (gastroesophageal reflux disease)   . Hypertension   . Radiation 03/23/16-05/09/16   45 Gy to right breast, boosted to 16 Gy    SURGICAL HISTORY: Past Surgical History:  Procedure Laterality Date  . ABDOMINAL HYSTERECTOMY  1983  . BREAST LUMPECTOMY WITH RADIOACTIVE SEED AND SENTINEL LYMPH NODE BIOPSY Right 11/03/2015   Procedure: RIGHT BREAST LUMPECTOMY WITH RADIOACTIVE SEED AND SENTINEL LYMPH NODE MAPPING;  Surgeon: Erroll Luna, MD;  Location: [place];  Service: General;  Laterality: Right;  . NASAL FRACTURE SURGERY    . PORTACATH PLACEMENT Right 11/03/2015   Procedure: INSERTION PORT-A-CATH;  Surgeon: Erroll Luna, MD;  Location: [place];  Service: General;  Laterality: Right;    SOCIAL HISTORY: Social History   Social History  . Marital status: Married    Spouse name: N/A  . Number of children: N/A  . Years of education: N/A   Occupational History  . retired    Social History Main Topics  . Smoking status: Never Smoker  . Smokeless tobacco: Never Used  . Alcohol use No  . Drug use: No  . Sexual activity: No   Other Topics Concern  . Not on file   Social History Narrative  . No narrative on file  Married 51 years. 0 children. Fostered children. 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 She likes to sew, knit, and quilt. She likes to walk but does not do it often. Non smoker ETOH, none She is from [facility] and moved to [place] when she got married.  FAMILY HISTORY: Family History    Problem Relation Age of Onset  . Breast cancer Mother   . Leukemia Father    indicated that the status of her mother is unknown. She indicated that the status of her father is unknown.   Father died at 71 yo of leukemia Mother died of old age; breast cancer diagnosis at 74 yo. She had a biopsy, no treatment. 1 sister, healthy. No breast cancer. Sister with stage IV colon cancer  ALLERGIES:  is allergic to lisinopril; losartan; and penicillins.  MEDICATIONS:  Current Outpatient Prescriptions  Medication Sig Dispense Refill  . albuterol (PROAIR HFA) 108 (90  BASE) MCG/ACT inhaler Inhale 2 puffs into the lungs every 6 (six) hours as needed for wheezing or shortness of breath.    Marland Kitchen amLODipine (NORVASC) 2.5 MG tablet Take 2.5 mg by mouth daily.    Marland Kitchen anastrozole (ARIMIDEX) 1 MG tablet Take 1 tablet (1 mg total) by mouth daily. 90 tablet 1  . atorvastatin (LIPITOR) 10 MG tablet Take 10 mg by mouth daily.    Marland Kitchen BREO ELLIPTA 100-25 MCG/INH AEPB     . Calcium Carbonate-Vitamin D (CALCIUM + D PO) Take 1 tablet by mouth every morning.    . Cholecalciferol (VITAMIN D3) 2000 units TABS Take by mouth.    . dextromethorphan-guaiFENesin (MUCINEX DM) 30-600 MG 12hr tablet Take 1 tablet by mouth daily as needed. Reported on 04/18/2016    . famotidine (PEPCID) 20 MG tablet Take 20 mg by mouth daily.     Marland Kitchen lidocaine-prilocaine (EMLA) cream Apply a quarter size amount to port site 1 hour prior to chemo. Do not rub in. Cover with plastic wrap. 30 g 3  . metFORMIN (GLUCOPHAGE-XR) 500 MG 24 hr tablet TAKE 1 TABLET (500 MG TOTAL) BY MOUTH DAILY WITH BREAKFAST. 30 tablet 3  . methylPREDNISolone (MEDROL DOSEPAK) 4 MG TBPK tablet Take by mouth daily. Use as directed 21 tablet 0  . montelukast (SINGULAIR) 10 MG tablet     . omeprazole (PRILOSEC) 40 MG capsule Take 40 mg by mouth daily.    . ondansetron (ZOFRAN) 8 MG tablet Take 1 tablet (8 mg total) by mouth every 8 (eight) hours as needed for nausea or vomiting. 30  tablet 2  . prochlorperazine (COMPAZINE) 10 MG tablet Take 1 tablet (10 mg total) by mouth every 6 (six) hours as needed for nausea or vomiting. 30 tablet 2  . Trastuzumab (HERCEPTIN IV) Inject into the vein. To start on November 26, 2015.    . valsartan (DIOVAN) 40 MG tablet TAKE 1 TABLET BY MOUTH EVERY DAY 30 tablet 1   No current facility-administered medications for this visit.    Facility-Administered Medications Ordered in Other Visits  Medication Dose Route Frequency Provider Last Rate Last Dose  . heparin lock flush 100 unit/mL  500 Units Intracatheter Once PRN Patrici Ranks, MD      . sodium chloride flush (NS) 0.9 % injection 10 mL  10 mL Intracatheter PRN Patrici Ranks, MD        Review of Systems  Constitutional: Negative for chills, fever and malaise/fatigue.  HENT: Negative.  Negative for congestion, hearing loss, nosebleeds, sore throat and tinnitus.   Eyes: Negative.  Negative for blurred vision, double vision, pain and discharge.  Respiratory: Positive for shortness of breath. Negative for hemoptysis, sputum production and wheezing.        Chronic  Cardiovascular: Negative.  Negative for chest pain, palpitations, claudication, leg swelling and PND.  Gastrointestinal: Negative for abdominal pain, blood in stool, constipation, diarrhea, heartburn, melena and nausea.  Genitourinary: Negative.  Negative for dysuria, frequency, hematuria and urgency.  Musculoskeletal: Positive for joint pain. Negative for falls and myalgias.       Joint pain from aromatase inhibitor  Skin: Negative.  Negative for itching and rash.  Neurological: Negative for dizziness, tingling, tremors, sensory change, speech change, focal weakness, seizures, loss of consciousness, weakness and headaches.  Psychiatric/Behavioral: Negative for depression, memory loss, substance abuse and suicidal ideas. The patient is not nervous/anxious.   All other systems reviewed and are negative. 14 point ROS was  done and is  otherwise as detailed above or in HPI   PHYSICAL EXAMINATION: ECOG PERFORMANCE STATUS: 0 - Asymptomatic  There were no vitals taken for this visit.  A blood pressure is 1 48 x 68.  Pulse is 78.  Respiration is 18.  Temperature 90.7 oxygen saturation 98% Physical Exam  Constitutional: She is oriented to person, place, and time and well-developed, well-nourished, and in no distress.  Wears glasses. Noted hair growth.  HENT:  Head: Normocephalic and atraumatic.  Nose: Nose normal.  Mouth/Throat: Oropharynx is clear and moist. No oropharyngeal exudate.  Eyes: Conjunctivae and EOM are normal. Pupils are equal, round, and reactive to light. Right eye exhibits no discharge. Left eye exhibits no discharge. No scleral icterus.  Neck: Normal range of motion. Neck supple. No tracheal deviation present. No thyromegaly present.  Cardiovascular: Normal rate, regular rhythm and normal heart sounds.  Exam reveals no gallop and no friction rub.   No murmur heard. Pulmonary/Chest: Effort normal. She has no wheezes. She has no rales.  Abdominal: Soft. Bowel sounds are normal. She exhibits no distension and no mass. There is no tenderness. There is no rebound and no guarding.  Musculoskeletal: Normal range of motion. She exhibits no edema.  Lymphadenopathy:    She has no cervical adenopathy.  Neurological: She is alert and oriented to person, place, and time. She has normal reflexes. No cranial nerve deficit. Gait normal. Coordination normal.  Skin: Skin is warm and dry. No rash noted.  Psychiatric: Mood, memory, affect and judgment normal.  Nursing note and vitals reviewed. Port-A-Cath site is within normal limit  LABORATORY DATA:  I have reviewed the data as listed Lab Results  Component Value Date   WBC 7.8 10/24/2016   HGB 11.9 (L) 10/24/2016   HCT 36.6 10/24/2016   MCV 92.4 10/24/2016   PLT 268 10/24/2016   CMP     Component Value Date/Time   NA 139 10/24/2016 1212   K 3.9  10/24/2016 1212   CL 108 10/24/2016 1212   CO2 24 10/24/2016 1212   GLUCOSE 153 (H) 10/24/2016 1212   BUN 10 10/24/2016 1212   CREATININE 1.06 (H) 10/24/2016 1212   CALCIUM 8.9 10/24/2016 1212   PROT 6.3 (L) 10/24/2016 1212   ALBUMIN 3.6 10/24/2016 1212   AST 18 10/24/2016 1212   ALT 12 (L) 10/24/2016 1212   ALKPHOS 109 10/24/2016 1212   BILITOT 1.0 10/24/2016 1212   GFRNONAA 50 (L) 10/24/2016 1212   GFRAA 58 (L) 10/24/2016 1212    RADIOGRAPHIC STUDIES: I have personally reviewed the radiological images as listed and agreed with the findings in the report. Study Result   CLINICAL DATA:  Breast cancer. Evaluate cardiac function in relation to chemotherapy.  EXAM: NUCLEAR MEDICINE CARDIAC BLOOD POOL IMAGING (MUGA)  TECHNIQUE: Cardiac multi-gated acquisition was performed at rest following intravenous injection of Tc-72mlabeled red blood cells.  RADIOPHARMACEUTICALS:  Twenty mCi Tc-934mDP in-vitro labeled red blood cells IV  COMPARISON:  Amy EGA scan, 04/18/2016, 02/15/2016, 11/19/2015  FINDINGS: No  focal wall motion abnormality of the left ventricle.  Calculated left ventricular ejection fraction equals 73%. Previously 70%.  IMPRESSION: Left ventricular ejection fraction equals 73 %.   Electronically Signed   By: StSuzy Bouchard.D.   On: 07/26/2016 15:53    PATHOLOGY:     ASSESSMENT & PLAN:  Invasive ductal carcinoma triple positive  ER+ (100%), PR+ (10%), HER 2 neu +  RIGHT breast cancer, upper inner quadrant Screening mammogram on 09/04/2015 with  possible R breast mass 2 densities noted in the R breast upper inner quadrant within a centimeter of each other (9 and 10 cm from the nipple) 11/03/2015 partial mastectomy, sentinel node biopsy and port placement with Dr. Brantley Stage Final pathology [id number] ER+PR+ Her 2 neu+ R breast carcinoma Grade I chemotherapy induced neuropathy feet  Cough, wheezing Hypertension DEXA with  osteoporosis Aromatase inhibitor related joint pain RLL pulmonary nodule Bronchiectasis  Continue chemotherapy with Herceptin. MUGA scan has been  canceled.  Patient most likely finished chemotherapy in middle of the December.  All the records from the outside office had been reviewed I have reviewed the above documentation for accuracy and completeness, and I agree with the above. Forest Gleason, MD  10/24/2016 1:12 PM

## 2016-10-24 NOTE — Progress Notes (Signed)
Per Robynn Pane, PA, patient is ok for Herceptin today and we will schedule Muga in the near future.  Communication sent to scheduler from MD appointment.  Patient tolerated infusion well.  VSS.  Patient ambulatory and stable upon discharge from clinic.

## 2016-10-27 ENCOUNTER — Encounter (HOSPITAL_COMMUNITY)
Admission: RE | Admit: 2016-10-27 | Discharge: 2016-10-27 | Disposition: A | Discharge status: Home / Self Care | Payer: Medicare Other | Source: Ambulatory Visit | Attending: Oncology | Admitting: Oncology

## 2016-10-27 ENCOUNTER — Ambulatory Visit (HOSPITAL_COMMUNITY)
Admission: RE | Admit: 2016-10-27 | Discharge: 2016-10-27 | Disposition: A | Discharge status: Home / Self Care | Payer: Medicare Other | Source: Ambulatory Visit | Attending: Adult Health | Admitting: Adult Health

## 2016-10-27 ENCOUNTER — Encounter (HOSPITAL_COMMUNITY): Payer: Self-pay

## 2016-10-27 DIAGNOSIS — K449 Diaphragmatic hernia without obstruction or gangrene: Secondary | ICD-10-CM | POA: Insufficient documentation

## 2016-10-27 DIAGNOSIS — C50211 Malignant neoplasm of upper-inner quadrant of right female breast: Secondary | ICD-10-CM | POA: Insufficient documentation

## 2016-10-27 DIAGNOSIS — Z170 Estrogen receptor positive status [ER+]: Secondary | ICD-10-CM | POA: Insufficient documentation

## 2016-10-27 DIAGNOSIS — T451X5A Adverse effect of antineoplastic and immunosuppressive drugs, initial encounter: Secondary | ICD-10-CM | POA: Diagnosis not present

## 2016-10-27 DIAGNOSIS — I700 Atherosclerosis of aorta: Secondary | ICD-10-CM | POA: Diagnosis not present

## 2016-10-27 DIAGNOSIS — K8020 Calculus of gallbladder without cholecystitis without obstruction: Secondary | ICD-10-CM | POA: Insufficient documentation

## 2016-10-27 DIAGNOSIS — R911 Solitary pulmonary nodule: Secondary | ICD-10-CM | POA: Diagnosis not present

## 2016-10-27 DIAGNOSIS — I728 Aneurysm of other specified arteries: Secondary | ICD-10-CM | POA: Insufficient documentation

## 2016-10-27 MED ORDER — TECHNETIUM TC 99M-LABELED RED BLOOD CELLS IV KIT
20.0000 | PACK | Freq: Once | INTRAVENOUS | Status: AC | PRN
  Administered 2016-10-27: 13 via INTRAVENOUS

## 2016-10-27 MED ORDER — HEPARIN SOD (PORK) LOCK FLUSH 100 UNIT/ML IV SOLN
INTRAVENOUS | Status: AC
Start: 2016-10-27 — End: 2016-10-27
  Filled 2016-10-27: qty 5

## 2016-11-02 ENCOUNTER — Telehealth: Payer: Self-pay | Admitting: Internal Medicine

## 2016-11-02 NOTE — Telephone Encounter (Signed)
Notes Recorded by Melvenia Needles, NP on 10/27/2016 at 2:55 PM EST CT chest shows stable 4 mm lung nodule without change in last year However more congestion/bronchiectasis changes on right , ? Flare or more sx.  Would make ov with Dr. Chase Caller to discuss in more detail  Will need CT chest in 1 year for a total of 2 year serial follow up for lung nodule , may need sooner if sx increased  Please contact office for sooner follow up if symptoms do not improve or worsen or seek emergency care   Relayed message to pt from [place]. Pt verbalized understanding

## 2016-11-02 NOTE — Telephone Encounter (Signed)
[place] and will call back to schedule appt. I offered this over the phone. Thanks.

## 2016-11-04 NOTE — Progress Notes (Signed)
LMTCB

## 2016-11-15 ENCOUNTER — Encounter: Payer: Self-pay | Admitting: *Deleted

## 2016-11-15 ENCOUNTER — Encounter (HOSPITAL_COMMUNITY): Payer: Self-pay | Admitting: Hematology & Oncology

## 2016-11-15 ENCOUNTER — Encounter (HOSPITAL_BASED_OUTPATIENT_CLINIC_OR_DEPARTMENT_OTHER): Payer: Medicare Other

## 2016-11-15 ENCOUNTER — Encounter (HOSPITAL_COMMUNITY): Payer: Medicare Other | Attending: Hematology & Oncology | Admitting: Hematology & Oncology

## 2016-11-15 VITALS — BP 125/62 | HR 71 | Temp 97.9°F | Resp 18 | Wt 113.5 lb

## 2016-11-15 DIAGNOSIS — C50211 Malignant neoplasm of upper-inner quadrant of right female breast: Secondary | ICD-10-CM

## 2016-11-15 DIAGNOSIS — J479 Bronchiectasis, uncomplicated: Secondary | ICD-10-CM

## 2016-11-15 DIAGNOSIS — C50919 Malignant neoplasm of unspecified site of unspecified female breast: Secondary | ICD-10-CM

## 2016-11-15 DIAGNOSIS — M810 Age-related osteoporosis without current pathological fracture: Secondary | ICD-10-CM

## 2016-11-15 DIAGNOSIS — Z5112 Encounter for antineoplastic immunotherapy: Secondary | ICD-10-CM

## 2016-11-15 DIAGNOSIS — Z170 Estrogen receptor positive status [ER+]: Principal | ICD-10-CM

## 2016-11-15 DIAGNOSIS — I10 Essential (primary) hypertension: Secondary | ICD-10-CM | POA: Diagnosis not present

## 2016-11-15 DIAGNOSIS — G620 Drug-induced polyneuropathy: Secondary | ICD-10-CM

## 2016-11-15 DIAGNOSIS — Z79811 Long term (current) use of aromatase inhibitors: Secondary | ICD-10-CM

## 2016-11-15 MED ORDER — HEPARIN SOD (PORK) LOCK FLUSH 100 UNIT/ML IV SOLN
500.0000 [IU] | Freq: Once | INTRAVENOUS | Status: AC | PRN
  Administered 2016-11-15: 500 [IU]

## 2016-11-15 MED ORDER — ACETAMINOPHEN 325 MG PO TABS
ORAL_TABLET | ORAL | Status: AC
  Filled 2016-11-15: qty 2

## 2016-11-15 MED ORDER — ACETAMINOPHEN 325 MG PO TABS
650.0000 mg | ORAL_TABLET | Freq: Once | ORAL | Status: AC
  Administered 2016-11-15: 650 mg via ORAL

## 2016-11-15 MED ORDER — SODIUM CHLORIDE 0.9 % IV SOLN
300.0000 mg | Freq: Once | INTRAVENOUS | Status: AC
  Administered 2016-11-15: 300 mg via INTRAVENOUS
  Filled 2016-11-15: qty 14.29

## 2016-11-15 MED ORDER — DIPHENHYDRAMINE HCL 25 MG PO CAPS
ORAL_CAPSULE | ORAL | Status: AC
  Filled 2016-11-15: qty 2

## 2016-11-15 MED ORDER — DIPHENHYDRAMINE HCL 25 MG PO CAPS
50.0000 mg | ORAL_CAPSULE | Freq: Once | ORAL | Status: AC
  Administered 2016-11-15: 50 mg via ORAL

## 2016-11-15 MED ORDER — SODIUM CHLORIDE 0.9 % IV SOLN
Freq: Once | INTRAVENOUS | Status: AC
  Administered 2016-11-15: 12:00:00 via INTRAVENOUS

## 2016-11-15 MED ORDER — ALENDRONATE SODIUM 70 MG PO TABS: 70.0000 mg | ORAL_TABLET | ORAL | 6 refills | Status: DC

## 2016-11-15 MED ORDER — SODIUM CHLORIDE 0.9% FLUSH: 10.0000 mL | INTRAVENOUS | Status: DC | PRN

## 2016-11-15 NOTE — Progress Notes (Signed)
Tolerated infusion w/o adverse reaction.  Alert, in no distress.  VSS.  Discharged ambulatory.  

## 2016-11-15 NOTE — Progress Notes (Signed)
[place] at [facility] Progress Note  Patient Care Team: Lajean Manes, MD as PCP - General  CHIEF COMPLAINTS:  Invasive ductal carcinoma triple positive  ER+, PR+, HER 2 neu +  RIGHT breast cancer, upper inner quadrant Screening mammogram on 09/04/2015 with possible R breast mass 2 densities noted in the R breast upper inner quadrant within a centimeter of each other (9 and 10 cm from the nipple) 11/03/2015 partial mastectomy, sentinel node biopsy and port placement with Dr. Brantley Stage Final pathology [id number] ER+PR+ Her 2 neu+ R breast carcinoma    Breast cancer of upper-inner quadrant of right female breast ([place])   09/01/2015 Mammogram    BI-RADS CATEGORY 0: Incomplete. Need additional imaging evaluation and/or prior mammograms for comparison.      09/15/2015 Imaging    CT chest- Right lower lobe 4 mm solid pulmonary nodule. If the patient is at high risk for bronchogenic carcinoma, follow-up chest CT at 1 year is recommended.      09/16/2015 Breast US    US showing a small hypoechoic irregular mass with hyperechoic rim in the 1 o'clock location of R breast 10 cm from the nipple. Mass measures 0.7 x 0.5 x 0.6 cm. 2nd mass is identified in the 1 o'clock location 9 cm from the nipple 0.7 x 0.7 x 0.6 cm      09/16/2015 Mammogram    Two suspicious masses in the 1 o'clock location of the right breast warranting tissue diagnosis.  No evidence for adenopathy.      09/21/2015 Pathology Results    Estrogen Receptor: 100%, POSITIVE, STRONG STAINING INTENSITY Progesterone Receptor: 10%, POSITIVE, MODERATE STAINING INTENSITY Proliferation Marker Ki67: 10%.  HER2 - **POSITIVE**      09/21/2015 Mammogram    Appropriate positioning of the 2 biopsy marking clips in the upper-inner quadrant of the right breast at 1 o'clock.      09/21/2015 Initial Biopsy    Breast, right, needle core biopsy, 1:00 o'clock, 9 CMFN - INVASIVE DUCTAL CARCINOMA. - DUCTAL CARCINOMA IN SITU. - SEE  COMMENT. 2. Breast, right, needle core biopsy, 1:00 o'clock, 10 CMFN - INVASIVE DUCTAL CARCINOMA. - DUCTAL CARCINOMA IN SITU WITH       10/26/2015 Procedure    Right chest porta cath placed with no adverse features.- Dr. Brantley Stage.      11/03/2015 Pathology Results    MULTIFOCAL INVASIVE DUCTAL CARCINOMA.  INVASIVE TUMOR IS 1.0 MM FROM NEAREST MARGIN (INFERIOR).  HIGH GRADE DUCTAL CARCINOMA IN SITU WITH NECROSIS.  IN SITU CARCINOMA IS 0.1 MM FROM THE NEAREST MARGIN (INFERIOR MARGIN).      11/03/2015 Procedure    Right lumpectomy by Dr. Brantley Stage.      11/17/2015 Initial Diagnosis    Breast cancer of upper-inner quadrant of right female breast ([place])      11/21/2015 Imaging    Normal left ventricular ejection fraction equal to 71.9%.      11/26/2015 -  Chemotherapy    Paclitaxel weekly x 12       11/26/2015 -  Antibody Plan    Herceptin weekly with Paclitaxel.      02/15/2016 Echocardiogram    MUGA- Normal LEFT ventricular ejection fraction 65% slightly decreased from 72% on the previous exam.       04/18/2016 Imaging    MUGA- Normal left ventricular ejection fraction equal to 70.1%. This has increased from 65.2% previously.       07/26/2016 Echocardiogram    MUGA Left ventricular ejection fraction  equals 73 %      10/27/2016 Imaging    MUGA- Normal LEFT ventricular ejection fraction of 68%, minimally decreased in a 73% on the previous exam.  Normal LEFT ventricular wall motion.       HISTORY OF PRESENTING ILLNESS:  Lori Greene 76 y.o. female is here for follow-up of  R breast cancer, disease is ER+, PR+,  HER 2 neu positive. She has completed weekly taxol/herceptin. She has completed XRT.  [name] returns to the [place] today unaccompanied.  She thought more about the medications for bone health. She is not sure about having an injection but would be interested in the oral medication. She is worried about breaking bones. She has multiple questions about  fosamax.  She reports cough every since she had her flu injection. This cough is resolving. She continues to follow with Dr. Luan Pulling for her pulmonary issues. She is not scheduled to see Dr. Luan Pulling again until May 2018. He prescribed her an antibiotic which she has a refill on.   Patient states the holidays were difficult for the family with her sister's colon cancer. She notes that it has gotten to the point where her sister sleeps most of the time.   She denies palpitations. No breast problems. Overdue for mammogram. Appetite is good. No other concerns. She continues on her AI therapy daily.   MEDICAL HISTORY:  Past Medical History:  Diagnosis Date  . Asthma   . Breast cancer ([place])    Right breast  . Cancer ([place]) 2016   right breast  . Diabetes mellitus without complication ([place])   . GERD (gastroesophageal reflux disease)   . Hypertension   . Radiation 03/23/16-05/09/16   45 Gy to right breast, boosted to 16 Gy    SURGICAL HISTORY: Past Surgical History:  Procedure Laterality Date  . ABDOMINAL HYSTERECTOMY  1983  . BREAST LUMPECTOMY WITH RADIOACTIVE SEED AND SENTINEL LYMPH NODE BIOPSY Right 11/03/2015   Procedure: RIGHT BREAST LUMPECTOMY WITH RADIOACTIVE SEED AND SENTINEL LYMPH NODE MAPPING;  Surgeon: Erroll Luna, MD;  Location: [place];  Service: General;  Laterality: Right;  . NASAL FRACTURE SURGERY    . PORTACATH PLACEMENT Right 11/03/2015   Procedure: INSERTION PORT-A-CATH;  Surgeon: Erroll Luna, MD;  Location: [place];  Service: General;  Laterality: Right;    SOCIAL HISTORY: Social History   Social History  . Marital status: Married    Spouse name: N/A  . Number of children: N/A  . Years of education: N/A   Occupational History  . retired    Social History Main Topics  . Smoking status: Never Smoker  . Smokeless tobacco: Never Used  . Alcohol use No  . Drug use: No  . Sexual activity: No   Other Topics Concern  .  Not on file   Social History Narrative  . No narrative on file  Married 51 years. 0 children. Fostered children. 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 She likes to sew, knit, and quilt. She likes to walk but does not do it often. Non smoker ETOH, none She is from [facility] and moved to [place] when she got married.  FAMILY HISTORY: Family History  Problem Relation Age of Onset  . Breast cancer Mother   . Leukemia Father    indicated that the status of her mother is unknown. She indicated that the status of her father is unknown.   Father died at 40 yo  of leukemia Mother died of old age; breast cancer diagnosis at 75 yo. She had a biopsy, no treatment. 1 sister, healthy. No breast cancer. Sister with stage IV colon cancer  ALLERGIES:  is allergic to lisinopril; losartan; and penicillins.  MEDICATIONS:  Current Outpatient Prescriptions  Medication Sig Dispense Refill  . albuterol (PROAIR HFA) 108 (90 BASE) MCG/ACT inhaler Inhale 2 puffs into the lungs every 6 (six) hours as needed for wheezing or shortness of breath.    Marland Kitchen alendronate (FOSAMAX) 70 MG tablet Take 1 tablet (70 mg total) by mouth once a week. Take with a full glass of water on an empty stomach. 1 tablet 6  . amLODipine (NORVASC) 2.5 MG tablet Take 2.5 mg by mouth daily.    Marland Kitchen anastrozole (ARIMIDEX) 1 MG tablet Take 1 tablet (1 mg total) by mouth daily. 90 tablet 1  . atorvastatin (LIPITOR) 10 MG tablet Take 10 mg by mouth daily.    Marland Kitchen BREO ELLIPTA 100-25 MCG/INH AEPB     . Calcium Carbonate-Vitamin D (CALCIUM + D PO) Take 1 tablet by mouth every morning.    . Cholecalciferol (VITAMIN D3) 2000 units TABS Take by mouth.    . dexamethasone (DECADRON) 4 MG tablet TAKE 2 TABLETS WEEKLY  12  . dextromethorphan-guaiFENesin (MUCINEX DM) 30-600 MG 12hr tablet Take 1 tablet by mouth daily as needed. Reported on 04/18/2016    . doxycycline (VIBRAMYCIN) 100 MG capsule Take 100 mg by mouth 2 (two)  times daily.  1  . famotidine (PEPCID) 20 MG tablet Take 20 mg by mouth daily.     Marland Kitchen lidocaine-prilocaine (EMLA) cream Apply a quarter size amount to port site 1 hour prior to chemo. Do not rub in. Cover with plastic wrap. 30 g 3  . metFORMIN (GLUCOPHAGE-XR) 500 MG 24 hr tablet TAKE 1 TABLET (500 MG TOTAL) BY MOUTH DAILY WITH BREAKFAST. 30 tablet 3  . methylPREDNISolone (MEDROL DOSEPAK) 4 MG TBPK tablet Take by mouth daily. Use as directed 21 tablet 0  . montelukast (SINGULAIR) 10 MG tablet     . omeprazole (PRILOSEC) 40 MG capsule Take 40 mg by mouth daily.    . ondansetron (ZOFRAN) 8 MG tablet Take 1 tablet (8 mg total) by mouth every 8 (eight) hours as needed for nausea or vomiting. 30 tablet 2  . prochlorperazine (COMPAZINE) 10 MG tablet Take 1 tablet (10 mg total) by mouth every 6 (six) hours as needed for nausea or vomiting. 30 tablet 2  . Trastuzumab (HERCEPTIN IV) Inject into the vein. To start on November 26, 2015.    . valsartan (DIOVAN) 40 MG tablet TAKE 1 TABLET BY MOUTH EVERY DAY 30 tablet 1   No current facility-administered medications for this visit.     Review of Systems  Constitutional: Negative for chills, fever and malaise/fatigue.  HENT: Negative.  Negative for congestion, hearing loss, nosebleeds, sore throat and tinnitus.   Eyes: Negative.  Negative for blurred vision, double vision, pain and discharge.  Respiratory: Positive for cough (improving) and shortness of breath. Negative for hemoptysis, sputum production and wheezing.        Chronic SOB Cough since flu shot  Cardiovascular: Negative.  Negative for chest pain, palpitations, claudication, leg swelling and PND.  Gastrointestinal: Negative for abdominal pain, blood in stool, constipation, diarrhea, heartburn, melena and nausea.  Genitourinary: Negative.  Negative for dysuria, frequency, hematuria and urgency.  Musculoskeletal: Positive for joint pain. Negative for falls and myalgias.       Joint  pain from  aromatase inhibitor  Skin: Negative.  Negative for itching and rash.  Neurological: Negative for dizziness, tingling, tremors, sensory change, speech change, focal weakness, seizures, loss of consciousness, weakness and headaches.  Psychiatric/Behavioral: Negative for depression, memory loss, substance abuse and suicidal ideas. The patient is not nervous/anxious.   All other systems reviewed and are negative. 14 point ROS was done and is otherwise as detailed above or in HPI   PHYSICAL EXAMINATION: ECOG PERFORMANCE STATUS: 0 - Asymptomatic  Vitals with BMI 11/15/2016  Height   Weight 113 lbs 8 oz  BMI   Systolic 010  Diastolic 66  Pulse 69  Respirations 20    Physical Exam  Constitutional: She is oriented to person, place, and time and well-developed, well-nourished, and in no distress.  Wears glasses.   HENT:  Head: Normocephalic and atraumatic.  Nose: Nose normal.  Mouth/Throat: Oropharynx is clear and moist. No oropharyngeal exudate.  Eyes: Conjunctivae and EOM are normal. Pupils are equal, round, and reactive to light. Right eye exhibits no discharge. Left eye exhibits no discharge. No scleral icterus.  Neck: Normal range of motion. Neck supple. No tracheal deviation present. No thyromegaly present.  Cardiovascular: Normal rate, regular rhythm and normal heart sounds.  Exam reveals no gallop and no friction rub.   No murmur heard. Pulmonary/Chest: Effort normal. She has no wheezes. She has no rales.  Abdominal: Soft. Bowel sounds are normal. She exhibits no distension and no mass. There is no tenderness. There is no rebound and no guarding.  Musculoskeletal: Normal range of motion. She exhibits no edema.  Lymphadenopathy:    She has no cervical adenopathy.  Neurological: She is alert and oriented to person, place, and time. She has normal reflexes. No cranial nerve deficit. Gait normal. Coordination normal.  Skin: Skin is warm and dry. No rash noted.  Psychiatric: Mood,  memory, affect and judgment normal.  Nursing note and vitals reviewed.   LABORATORY DATA:  I have reviewed the data as listed Lab Results  Component Value Date   WBC 7.8 10/24/2016   HGB 11.9 (L) 10/24/2016   HCT 36.6 10/24/2016   MCV 92.4 10/24/2016   PLT 268 10/24/2016   CMP     Component Value Date/Time   NA 139 10/24/2016 1212   K 3.9 10/24/2016 1212   CL 108 10/24/2016 1212   CO2 24 10/24/2016 1212   GLUCOSE 153 (H) 10/24/2016 1212   BUN 10 10/24/2016 1212   CREATININE 1.06 (H) 10/24/2016 1212   CALCIUM 8.9 10/24/2016 1212   PROT 6.3 (L) 10/24/2016 1212   ALBUMIN 3.6 10/24/2016 1212   AST 18 10/24/2016 1212   ALT 12 (L) 10/24/2016 1212   ALKPHOS 109 10/24/2016 1212   BILITOT 1.0 10/24/2016 1212   GFRNONAA 50 (L) 10/24/2016 1212   GFRAA 58 (L) 10/24/2016 1212    RADIOGRAPHIC STUDIES: I have personally reviewed the radiological images as listed and agreed with the findings in the report. Study Result   CLINICAL DATA:  RIGHT breast cancer, on Herceptin therapy  EXAM: NUCLEAR MEDICINE CARDIAC BLOOD POOL IMAGING (MUGA)  TECHNIQUE: Cardiac multi-gated acquisition was performed at rest following intravenous injection of Tc-88mlabeled red blood cells.  RADIOPHARMACEUTICALS:  13 mCi Tc-919mertechnetate in-vitro labeled autologous red blood cells IV  COMPARISON:  07/20/2016  FINDINGS: Calculated LEFT ventricular ejection fraction 68%, minimally decreased from the 73% on the previous study.  Wall motion analysis of the LEFT ventricle in 3 projections remains normal.  IMPRESSION:  Normal LEFT ventricular ejection fraction of 68%, minimally decreased in a 73% on the previous exam.  Normal LEFT ventricular wall motion.   Electronically Signed   By: Lavonia Dana M.D.   On: 10/27/2016 12:51   Study Result   CLINICAL DATA:  Followup of lung nodule. Right breast cancer with lumpectomy, chemotherapy, and radiation therapy. Diagnosed in  2016. Right breast primary.  EXAM: CT CHEST WITHOUT CONTRAST  TECHNIQUE: Multidetector CT imaging of the chest was performed following the standard protocol without IV contrast.  COMPARISON:  09/15/2015  FINDINGS: Cardiovascular: Aortic atherosclerosis. Moderate cardiomegaly, without pericardial effusion. Right Port-A-Cath terminates at the superior caval/atrial junction.  Mediastinum/Nodes: No supraclavicular adenopathy. No mediastinal or definite hilar adenopathy, given limitations of unenhanced CT. Moderate hiatal hernia. Prominent prevascular nodes are not pathologic and are similar at maximally 7 mm. No internal mammary adenopathy.  Lungs/Pleura: New areas of bronchial wall thickening and mucoid impaction in both lower lobes. New volume loss and atelectasis in the medial right upper lobe and lingula.  4 mm right lower lobe pulmonary nodule is unchanged on image 86/series 3.  Upper Abdomen: Cholelithiasis. Normal imaged portions of the liver, spleen, adrenal glands, kidneys. 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  Musculoskeletal: No acute osseous abnormality. Right axillary node dissection. Medial right breast surgical clips. No axillary adenopathy.  IMPRESSION: 1. Similar appearance of the 4 mm right lower lobe pulmonary nodule, favoring a benign etiology. 2. New lower lobe predominant bronchial wall thickening and presumed mucoid impaction. Likely related to interval infection or inflammation. Given the moderate hiatal hernia, recommend clinical exclusion of aspiration. 3. No evidence of metastatic disease. 4.  Aortic atherosclerosis. 5. Cholelithiasis. 6. splenic artery aneurysm, similar.   Electronically Signed   By: Abigail Miyamoto M.D.   On: 10/27/2016 14:26    PATHOLOGY:     ASSESSMENT & PLAN:  Invasive ductal carcinoma triple positive  ER+ (100%), PR+ (10%), HER 2 neu +  RIGHT breast cancer, upper inner  quadrant Screening mammogram on 09/04/2015 with possible R breast mass 2 densities noted in the R breast upper inner quadrant within a centimeter of each other (9 and 10 cm from the nipple) 11/03/2015 partial mastectomy, sentinel node biopsy and port placement with Dr. Brantley Stage Final pathology [id number] ER+PR+ Her 2 neu+ R breast carcinoma Grade I chemotherapy induced neuropathy feet  Cough, wheezing Hypertension DEXA with osteoporosis Aromatase inhibitor related joint pain RLL pulmonary nodule Bronchiectasis  She is doing better from a pulmonary perspective overall .She will continue to follow with Dr. Luan Pulling.  MUGA is up to date.   Goal again is to complete 52 weeks of herceptin. She will complete her last cycle today. Proceed with herceptin today. She is to continue on her arimidex, calcium plus D.  She has osteoporosis and will need bone directed therapy. I have discussed prolia, bisphosphonates. After a long discussion she agreed to to Fosamax weekly. A prescription will be called to her pharmacy.  Patient appears well. She is having a difficult time knowing that her sister will pass soon.   Her last mammogram was in October 2016. I have ordered repeat diagnostic mammogram.   Her last bone density scan was 07/14/2016.  She will return for follow up in 3 months.  Orders Placed This Encounter  Procedures  . MM DIAG BREAST TOMO BILATERAL    Standing Status:   Future    Standing Expiration Date:   01/15/2018    Order Specific Question:  Reason for Exam (SYMPTOM  OR DIAGNOSIS REQUIRED)    Answer:   breast cancer follow-up    Order Specific Question:   Preferred imaging location?    Answer:   [facility]    Meds ordered this encounter  Medications  . DISCONTD: alendronate (FOSAMAX) 70 MG tablet    Sig: Take 1 tablet (70 mg total) by mouth once a week. Take with a full glass of water on an empty stomach.    Dispense:  1 tablet    Refill:  6  . alendronate (FOSAMAX) 70  MG tablet    Sig: Take 1 tablet (70 mg total) by mouth once a week. Take with a full glass of water on an empty stomach.    Dispense:  1 tablet    Refill:  6   All questions were answered. The patient knows to call the clinic with any problems, questions or concerns.  This note was electronically signed.   This document serves as a record of services personally performed by Ancil Linsey, MD. It was created on her behalf by Arlyce Harman, a trained medical scribe. The creation of this record is based on the scribe's personal observations and the provider's statements to them. This document has been checked and approved by the attending provider.  I have reviewed the above documentation for accuracy and completeness, and I agree with the above. Molli Hazard, MD  11/16/2016 6:00 PM

## 2016-11-15 NOTE — Patient Instructions (Signed)
[facility] Discharge Instructions for Patients Receiving Chemotherapy   Beginning January 23rd 2017 lab work for the Cancer Center will be done in the  Main lab at [place] on 1st floor. If you have a lab appointment with the Cancer Center please come in thru the  Main Entrance and check in at the main information desk   Today you received the following chemotherapy agent: herceptin     If you develop nausea and vomiting, or diarrhea that is not controlled by your medication, call the clinic.  The clinic phone number is [phone]. Office hours are Monday-Friday 8:30am-5:00pm.  BELOW ARE SYMPTOMS THAT SHOULD BE REPORTED IMMEDIATELY:  *FEVER GREATER THAN 101.0 F  *CHILLS WITH OR WITHOUT FEVER  NAUSEA AND VOMITING THAT IS NOT CONTROLLED WITH YOUR NAUSEA MEDICATION  *UNUSUAL SHORTNESS OF BREATH  *UNUSUAL BRUISING OR BLEEDING  TENDERNESS IN MOUTH AND THROAT WITH OR WITHOUT PRESENCE OF ULCERS  *URINARY PROBLEMS  *BOWEL PROBLEMS  UNUSUAL RASH Items with * indicate a potential emergency and should be followed up as soon as possible. If you have an emergency after office hours please contact your primary care physician or go to the nearest emergency department.  Please call the clinic during office hours if you have any questions or concerns.   You may also contact the Patient Navigator at [phone] should you have any questions or need assistance in obtaining follow up care.      Resources For Cancer Patients and their Caregivers ? American Cancer Society: Can assist with transportation, wigs, general needs, runs Look Good Feel Better.        1-888-227-6333 ? Cancer Care: Provides financial assistance, online support groups, medication/co-pay assistance.  1-800-813-HOPE (4673) ? [facility] Assists [place] cancer patients and their families through emotional , educational and financial support.   336-427-4357 ? [place] DSS Where to apply for food stamps, Medicaid and utility assistance. 336-342-1394 ? [facility]: Transportation to medical appointments. 336-347-2287 ? Social Security Administration: May apply for disability if have a Stage IV cancer. 336-342-7796 1-800-772-1213 ? [place] Aging, Disability and Transit Services: Assists with nutrition, care and transit needs. 336-349-2343          

## 2016-11-15 NOTE — Progress Notes (Signed)
[facility] Clinical Social Work  Clinical Social Work was referred by [place] rounding through infusion area. Clinical Social Worker met with patient briefly to offer support and assess for needs.  Pt in good spirits as she reports today is her last treatment. CSW shared with pt information about available resources as she moves into survivorship. Pt interested in [place] and Support group. CSW provided pt with flyers on both resources. CSW educated pt on common emotions pt may experience as she moves into survivorship and discussed coping techniques as well. CSW also discussed FYNN class at [facility], but pt was not interested in attending. She agrees to reach out as needed.     Clinical Social Work interventions: Resource education Check in.   Loren Racer, [place] Tuesdays   [phone]

## 2016-11-15 NOTE — Patient Instructions (Addendum)
[place] at [facility] Discharge Instructions  RECOMMENDATIONS MADE BY THE CONSULTANT AND ANY TEST RESULTS WILL BE SENT TO YOUR REFERRING PHYSICIAN.  You were seen today by Dr. Whitney Muse Fosamax called in to pharmacy Diagnostic mammogram next Tuesday Follow up in 3 months with labs  Thank you for choosing [place] at [facility] to provide your oncology and hematology care.  To afford each patient quality time with our provider, please arrive at least 15 minutes before your scheduled appointment time.    If you have a lab appointment with the [facility] please come in thru the  Main Entrance and check in at the main information desk  You need to re-schedule your appointment should you arrive 10 or more minutes late.  We strive to give you quality time with our providers, and arriving late affects you and other patients whose appointments are after yours.  Also, if you no show three or more times for appointments you may be dismissed from the clinic at the providers discretion.     Again, thank you for choosing [facility].  Our hope is that these requests will decrease the amount of time that you wait before being seen by our physicians.       _____________________________________________________________  Should you have questions after your visit to [facility] [place], please contact our office at [phone] between the hours of 8:30 a.m. and 4:30 p.m.  Voicemails left after 4:30 p.m. will not be returned until the following business day.  For prescription refill requests, have your pharmacy contact our office.       Resources For Cancer Patients and their Caregivers ? American Cancer Society: Can assist with transportation, wigs, general needs, runs Look Good Feel Better.        ([phone] ? Cancer Care: Provides financial assistance, online support groups, medication/co-pay assistance.  1-800-813-HOPE  518 137 7511) ? [place] Assists [place] cancer patients and their families through emotional , educational and financial support.  316-221-6726 ? [place] DSS Where to apply for food stamps, Medicaid and utility assistance. 9064093755 ? [employer]: Transportation to medical appointments. (972)180-4929 ? Social Security Administration: May apply for disability if have a Stage IV cancer. 818-557-6805 3185004167 ? [employer], Disability and Transit Services: Assists with nutrition, care and transit needs. [place] Support Programs: @10RELATIVEDAYS @ > Cancer Support Group  2nd Tuesday of the month 1pm-2pm, Journey Room  > Creative Journey  3rd Tuesday of the month 1130am-1pm, Journey Room  > Look Good Feel Better  1st Wednesday of the month 10am-12 noon, Journey Room (Call [place] to register [phone])

## 2016-11-16 ENCOUNTER — Encounter (HOSPITAL_COMMUNITY): Payer: Self-pay | Admitting: Hematology & Oncology

## 2016-11-17 ENCOUNTER — Other Ambulatory Visit (HOSPITAL_COMMUNITY): Payer: Self-pay | Admitting: Oncology

## 2016-11-17 DIAGNOSIS — Z9889 Other specified postprocedural states: Secondary | ICD-10-CM

## 2016-11-20 ENCOUNTER — Other Ambulatory Visit (HOSPITAL_COMMUNITY): Payer: Self-pay | Admitting: Hematology & Oncology

## 2016-11-20 DIAGNOSIS — Z79899 Other long term (current) drug therapy: Secondary | ICD-10-CM

## 2016-11-20 DIAGNOSIS — C50211 Malignant neoplasm of upper-inner quadrant of right female breast: Secondary | ICD-10-CM

## 2016-11-21 ENCOUNTER — Other Ambulatory Visit: Payer: Self-pay | Admitting: Adult Health

## 2016-11-21 DIAGNOSIS — R911 Solitary pulmonary nodule: Secondary | ICD-10-CM

## 2016-11-22 ENCOUNTER — Ambulatory Visit (HOSPITAL_COMMUNITY)
Admission: RE | Admit: 2016-11-22 | Discharge: 2016-11-22 | Disposition: A | Discharge status: Home / Self Care | Payer: Medicare Other | Source: Ambulatory Visit | Attending: Hematology & Oncology | Admitting: Hematology & Oncology

## 2016-11-22 ENCOUNTER — Ambulatory Visit (HOSPITAL_COMMUNITY)
Admission: RE | Admit: 2016-11-22 | Discharge: 2016-11-22 | Disposition: A | Discharge status: Home / Self Care | Payer: Medicare Other | Source: Ambulatory Visit | Attending: Oncology | Admitting: Oncology

## 2016-11-22 ENCOUNTER — Other Ambulatory Visit (HOSPITAL_COMMUNITY): Payer: Self-pay | Admitting: Hematology & Oncology

## 2016-11-22 DIAGNOSIS — Z853 Personal history of malignant neoplasm of breast: Secondary | ICD-10-CM | POA: Diagnosis not present

## 2016-11-22 DIAGNOSIS — Z923 Personal history of irradiation: Secondary | ICD-10-CM | POA: Diagnosis not present

## 2016-11-22 DIAGNOSIS — Z170 Estrogen receptor positive status [ER+]: Secondary | ICD-10-CM

## 2016-11-22 DIAGNOSIS — Z9889 Other specified postprocedural states: Secondary | ICD-10-CM

## 2016-11-22 DIAGNOSIS — R229 Localized swelling, mass and lump, unspecified: Principal | ICD-10-CM

## 2016-11-22 DIAGNOSIS — C50211 Malignant neoplasm of upper-inner quadrant of right female breast: Secondary | ICD-10-CM

## 2016-11-22 DIAGNOSIS — N6311 Unspecified lump in the right breast, upper outer quadrant: Secondary | ICD-10-CM | POA: Diagnosis not present

## 2016-11-22 DIAGNOSIS — IMO0002 Reserved for concepts with insufficient information to code with codable children: Secondary | ICD-10-CM

## 2016-11-24 ENCOUNTER — Other Ambulatory Visit (HOSPITAL_COMMUNITY): Payer: Self-pay | Admitting: Hematology & Oncology

## 2016-11-24 DIAGNOSIS — N6310 Unspecified lump in the right breast, unspecified quadrant: Secondary | ICD-10-CM

## 2016-11-25 ENCOUNTER — Other Ambulatory Visit (HOSPITAL_COMMUNITY): Payer: Self-pay | Admitting: Hematology & Oncology

## 2016-11-25 DIAGNOSIS — R229 Localized swelling, mass and lump, unspecified: Secondary | ICD-10-CM

## 2016-11-25 DIAGNOSIS — N6310 Unspecified lump in the right breast, unspecified quadrant: Secondary | ICD-10-CM

## 2016-11-25 DIAGNOSIS — N63 Unspecified lump in unspecified breast: Secondary | ICD-10-CM

## 2016-11-25 DIAGNOSIS — R1900 Intra-abdominal and pelvic swelling, mass and lump, unspecified site: Secondary | ICD-10-CM

## 2016-11-25 DIAGNOSIS — IMO0002 Reserved for concepts with insufficient information to code with codable children: Secondary | ICD-10-CM

## 2016-11-28 ENCOUNTER — Other Ambulatory Visit (HOSPITAL_COMMUNITY): Payer: Self-pay | Admitting: Hematology & Oncology

## 2016-11-28 DIAGNOSIS — IMO0002 Reserved for concepts with insufficient information to code with codable children: Secondary | ICD-10-CM

## 2016-11-28 DIAGNOSIS — R229 Localized swelling, mass and lump, unspecified: Principal | ICD-10-CM

## 2016-11-28 DIAGNOSIS — N6310 Unspecified lump in the right breast, unspecified quadrant: Secondary | ICD-10-CM

## 2016-11-29 ENCOUNTER — Other Ambulatory Visit (HOSPITAL_COMMUNITY): Payer: Self-pay | Admitting: Hematology & Oncology

## 2016-11-29 ENCOUNTER — Ambulatory Visit (HOSPITAL_COMMUNITY)
Admission: RE | Admit: 2016-11-29 | Discharge: 2016-11-29 | Disposition: A | Discharge status: Home / Self Care | Payer: Medicare Other | Source: Ambulatory Visit | Attending: Hematology & Oncology | Admitting: Hematology & Oncology

## 2016-11-29 ENCOUNTER — Ambulatory Visit (HOSPITAL_COMMUNITY): Admission: RE | Admit: 2016-11-29 | Payer: Medicare Other | Source: Ambulatory Visit

## 2016-11-29 DIAGNOSIS — N6489 Other specified disorders of breast: Secondary | ICD-10-CM | POA: Insufficient documentation

## 2016-11-29 DIAGNOSIS — IMO0002 Reserved for concepts with insufficient information to code with codable children: Secondary | ICD-10-CM

## 2016-11-29 DIAGNOSIS — N6031 Fibrosclerosis of right breast: Secondary | ICD-10-CM | POA: Diagnosis not present

## 2016-11-29 DIAGNOSIS — N6311 Unspecified lump in the right breast, upper outer quadrant: Secondary | ICD-10-CM | POA: Diagnosis not present

## 2016-11-29 DIAGNOSIS — N6312 Unspecified lump in the right breast, upper inner quadrant: Secondary | ICD-10-CM | POA: Diagnosis not present

## 2016-11-29 DIAGNOSIS — R229 Localized swelling, mass and lump, unspecified: Secondary | ICD-10-CM

## 2016-11-29 MED ORDER — LIDOCAINE HCL (PF) 1 % IJ SOLN
INTRAMUSCULAR | Status: AC
  Filled 2016-11-29: qty 5

## 2016-11-29 MED ORDER — LIDOCAINE-EPINEPHRINE (PF) 1 %-1:200000 IJ SOLN
INTRAMUSCULAR | Status: AC
  Filled 2016-11-29: qty 30

## 2016-12-06 ENCOUNTER — Inpatient Hospital Stay (HOSPITAL_COMMUNITY): Payer: Medicare Other

## 2016-12-16 ENCOUNTER — Other Ambulatory Visit (HOSPITAL_COMMUNITY): Payer: Self-pay | Admitting: Hematology & Oncology

## 2016-12-27 ENCOUNTER — Inpatient Hospital Stay (HOSPITAL_COMMUNITY): Payer: Medicare Other

## 2017-01-02 DIAGNOSIS — E1121 Type 2 diabetes mellitus with diabetic nephropathy: Secondary | ICD-10-CM | POA: Diagnosis not present

## 2017-01-02 DIAGNOSIS — E119 Type 2 diabetes mellitus without complications: Secondary | ICD-10-CM | POA: Diagnosis not present

## 2017-01-02 DIAGNOSIS — I129 Hypertensive chronic kidney disease with stage 1 through stage 4 chronic kidney disease, or unspecified chronic kidney disease: Secondary | ICD-10-CM | POA: Diagnosis not present

## 2017-01-02 DIAGNOSIS — K210 Gastro-esophageal reflux disease with esophagitis: Secondary | ICD-10-CM | POA: Diagnosis not present

## 2017-01-02 DIAGNOSIS — E7800 Pure hypercholesterolemia, unspecified: Secondary | ICD-10-CM | POA: Diagnosis not present

## 2017-01-02 DIAGNOSIS — I10 Essential (primary) hypertension: Secondary | ICD-10-CM | POA: Diagnosis not present

## 2017-01-02 DIAGNOSIS — N183 Chronic kidney disease, stage 3 (moderate): Secondary | ICD-10-CM | POA: Diagnosis not present

## 2017-01-02 DIAGNOSIS — J471 Bronchiectasis with (acute) exacerbation: Secondary | ICD-10-CM | POA: Diagnosis not present

## 2017-01-02 DIAGNOSIS — Z7984 Long term (current) use of oral hypoglycemic drugs: Secondary | ICD-10-CM | POA: Diagnosis not present

## 2017-01-02 DIAGNOSIS — Z79899 Other long term (current) drug therapy: Secondary | ICD-10-CM | POA: Diagnosis not present

## 2017-01-07 ENCOUNTER — Other Ambulatory Visit (HOSPITAL_COMMUNITY): Payer: Self-pay | Admitting: Hematology & Oncology

## 2017-01-09 ENCOUNTER — Other Ambulatory Visit (HOSPITAL_COMMUNITY): Payer: Self-pay | Admitting: Hematology & Oncology

## 2017-01-09 DIAGNOSIS — C50211 Malignant neoplasm of upper-inner quadrant of right female breast: Secondary | ICD-10-CM

## 2017-01-09 DIAGNOSIS — Z170 Estrogen receptor positive status [ER+]: Principal | ICD-10-CM

## 2017-01-10 ENCOUNTER — Encounter (HOSPITAL_COMMUNITY): Payer: Medicare Other | Attending: Hematology & Oncology

## 2017-01-10 ENCOUNTER — Encounter (HOSPITAL_COMMUNITY): Payer: Self-pay

## 2017-01-10 VITALS — BP 143/72 | HR 69 | Temp 97.7°F | Resp 18

## 2017-01-10 DIAGNOSIS — C50211 Malignant neoplasm of upper-inner quadrant of right female breast: Secondary | ICD-10-CM | POA: Insufficient documentation

## 2017-01-10 DIAGNOSIS — Z452 Encounter for adjustment and management of vascular access device: Secondary | ICD-10-CM

## 2017-01-10 DIAGNOSIS — Z95828 Presence of other vascular implants and grafts: Secondary | ICD-10-CM

## 2017-01-10 MED ORDER — SODIUM CHLORIDE 0.9% FLUSH
10.0000 mL | INTRAVENOUS | Status: DC | PRN
  Administered 2017-01-10: 10 mL via INTRAVENOUS
  Filled 2017-01-10: qty 10

## 2017-01-10 MED ORDER — HEPARIN SOD (PORK) LOCK FLUSH 100 UNIT/ML IV SOLN
500.0000 [IU] | Freq: Once | INTRAVENOUS | Status: AC
  Administered 2017-01-10: 500 [IU] via INTRAVENOUS

## 2017-01-10 NOTE — Patient Instructions (Signed)
[facility] at [facility] Discharge Instructions  RECOMMENDATIONS MADE BY THE CONSULTANT AND ANY TEST RESULTS WILL BE SENT TO YOUR REFERRING PHYSICIAN.  Portacath flushed today. Follow-up as scheduled. Call clinic for any questions or concerns  Thank you for choosing [facility] at [facility] to provide your oncology and hematology care.  To afford each patient quality time with our provider, please arrive at least 15 minutes before your scheduled appointment time.    If you have a lab appointment with the Cancer Center please come in thru the  Main Entrance and check in at the main information desk  You need to re-schedule your appointment should you arrive 10 or more minutes late.  We strive to give you quality time with our providers, and arriving late affects you and other patients whose appointments are after yours.  Also, if you no show three or more times for appointments you may be dismissed from the clinic at the providers discretion.     Again, thank you for choosing [facility].  Our hope is that these requests will decrease the amount of time that you wait before being seen by our physicians.       _____________________________________________________________  Should you have questions after your visit to [facility], please contact our office at [phone] between the hours of 8:30 a.m. and 4:30 p.m.  Voicemails left after 4:30 p.m. will not be returned until the following business day.  For prescription refill requests, have your pharmacy contact our office.       Resources For Cancer Patients and their Caregivers ? American Cancer Society: Can assist with transportation, wigs, general needs, runs Look Good Feel Better.        1-888-227-6333 ? Cancer Care: Provides financial assistance, online support groups, medication/co-pay assistance.  1-800-813-HOPE (4673) ? [facility] Assists [place] cancer patients and their families through emotional , educational and financial support.  336-427-4357 ? [place] DSS Where to apply for food stamps, Medicaid and utility assistance. 336-342-1394 ? [employer]: Transportation to medical appointments. 336-347-2287 ? Social Security Administration: May apply for disability if have a Stage IV cancer. 336-342-7796 1-800-772-1213 ? [place] Aging, Disability and Transit Services: Assists with nutrition, care and transit needs. 336-349-2343  Cancer Center Support Programs: @10RELATIVEDAYS@ > Cancer Support Group  2nd Tuesday of the month 1pm-2pm, Journey Room  > Creative Journey  3rd Tuesday of the month 1130am-1pm, Journey Room  > Look Good Feel Better  1st Wednesday of the month 10am-12 noon, Journey Room (Call American Cancer Society to register 1-800-395-5775)   

## 2017-01-10 NOTE — Progress Notes (Signed)
.   Lori Greene presented for Portacath access and flush.  Proper placement of portacath confirmed by CXR.  Portacath located right chest wall accessed with  20 gauge needle with good blood return noted Portacath flushed with 48ml NS and 500U/64ml Heparin and needle removed intact.  Procedure tolerated well and without incident.Pt discharged self ambulatory in satisfactory condition

## 2017-01-17 ENCOUNTER — Inpatient Hospital Stay (HOSPITAL_COMMUNITY): Payer: Medicare Other

## 2017-01-26 ENCOUNTER — Encounter: Payer: Self-pay | Admitting: General Surgery

## 2017-01-26 ENCOUNTER — Ambulatory Visit (INDEPENDENT_AMBULATORY_CARE_PROVIDER_SITE_OTHER): Payer: Medicare Other | Admitting: General Surgery

## 2017-01-26 VITALS — BP 120/59 | HR 84 | Temp 99.3°F | Resp 18 | Ht 60.0 in | Wt 115.0 lb

## 2017-01-26 DIAGNOSIS — K8020 Calculus of gallbladder without cholecystitis without obstruction: Secondary | ICD-10-CM

## 2017-01-26 NOTE — Patient Instructions (Signed)

## 2017-01-26 NOTE — Progress Notes (Signed)
Lori Greene [id number]; 18-Mar-1941   HPI Patient is a 76 year old white female who was found on recent CT scan of the  Chest to have cholelithiasis.  She states she has reflux disease and at night, it seems to worsen.  She denies any right upper quadrant abdominal pain or nausea.  She has always had some fatty food intolerance, but it varies.  She denies any fever, chills, or jaundice.  She currently has no pain.  She was concerned about her gallstones as her mother had a gangrenous gallbladder required emergency surgery.    Patient referred by Dr. Luan Pulling for further evaluation and treatment. Past Medical History:  Diagnosis Date  . Asthma   . Breast cancer ([place])    Right breast  . Cancer ([place]) 2016   right breast  . Diabetes mellitus without complication ([place])   . GERD (gastroesophageal reflux disease)   . Hypertension   . Radiation 03/23/16-05/09/16   45 Gy to right breast, boosted to 16 Gy    Past Surgical History:  Procedure Laterality Date  . ABDOMINAL HYSTERECTOMY  1983  . BREAST LUMPECTOMY WITH RADIOACTIVE SEED AND SENTINEL LYMPH NODE BIOPSY Right 11/03/2015   Procedure: RIGHT BREAST LUMPECTOMY WITH RADIOACTIVE SEED AND SENTINEL LYMPH NODE MAPPING;  Surgeon: Erroll Luna, MD;  Location: [facility];  Service: General;  Laterality: Right;  . NASAL FRACTURE SURGERY    . PORTACATH PLACEMENT Right 11/03/2015   Procedure: INSERTION PORT-A-CATH;  Surgeon: Erroll Luna, MD;  Location: [facility];  Service: General;  Laterality: Right;    Family History  Problem Relation Age of Onset  . Breast cancer Mother   . Leukemia Father     Current Outpatient Prescriptions on File Prior to Visit  Medication Sig Dispense Refill  . albuterol (PROAIR HFA) 108 (90 BASE) MCG/ACT inhaler Inhale 2 puffs into the lungs every 6 (six) hours as needed for wheezing or shortness of breath.    Marland Kitchen alendronate (FOSAMAX) 70 MG tablet TAKE 1 TABLET BY MOUTH ONCE WEEKLY WITH A  FULL GLASS OF WATER ON EMPTY STOMACH 1 tablet 1  . amLODipine (NORVASC) 2.5 MG tablet Take 2.5 mg by mouth daily.    Marland Kitchen anastrozole (ARIMIDEX) 1 MG tablet Take 1 tablet (1 mg total) by mouth daily. 90 tablet 1  . atorvastatin (LIPITOR) 10 MG tablet Take 10 mg by mouth daily.    Marland Kitchen BREO ELLIPTA 100-25 MCG/INH AEPB     . Calcium Carbonate-Vitamin D (CALCIUM + D PO) Take 1 tablet by mouth every morning.    . Cholecalciferol (VITAMIN D3) 2000 units TABS Take by mouth.    . dexamethasone (DECADRON) 4 MG tablet TAKE 2 TABLETS WEEKLY  12  . dextromethorphan-guaiFENesin (MUCINEX DM) 30-600 MG 12hr tablet Take 1 tablet by mouth daily as needed. Reported on 04/18/2016    . doxycycline (VIBRAMYCIN) 100 MG capsule Take 100 mg by mouth 2 (two) times daily.  1  . famotidine (PEPCID) 20 MG tablet Take 20 mg by mouth daily.     Marland Kitchen lidocaine-prilocaine (EMLA) cream Apply a quarter size amount to port site 1 hour prior to chemo. Do not rub in. Cover with plastic wrap. 30 g 3  . metFORMIN (GLUCOPHAGE) 500 MG tablet TAKE 1 TABLET DAILY WITH BREAKFAST (Patient taking differently: 1 tablet BID prior to brakfast and dinner) 30 tablet 2  . methylPREDNISolone (MEDROL DOSEPAK) 4 MG TBPK tablet Take by mouth daily. Use as directed (Patient not taking: Reported on 01/10/2017)  21 tablet 0  . montelukast (SINGULAIR) 10 MG tablet     . omeprazole (PRILOSEC) 40 MG capsule Take 40 mg by mouth daily.    . ondansetron (ZOFRAN) 8 MG tablet Take 1 tablet (8 mg total) by mouth every 8 (eight) hours as needed for nausea or vomiting. 30 tablet 2  . prochlorperazine (COMPAZINE) 10 MG tablet Take 1 tablet (10 mg total) by mouth every 6 (six) hours as needed for nausea or vomiting. 30 tablet 2  . tiotropium (SPIRIVA) 18 MCG inhalation capsule Place 18 mcg into inhaler and inhale daily.    . Trastuzumab (HERCEPTIN IV) Inject into the vein. To start on November 26, 2015.    . valsartan (DIOVAN) 40 MG tablet TAKE 1 TABLET BY MOUTH EVERY DAY 30  tablet 1   No current facility-administered medications on file prior to visit.     Allergies  Allergen Reactions  . Lisinopril Cough  . Losartan Hives    Questionable losartan vs lidocaine reaction  . Penicillins Rash    History  Alcohol Use No    History  Smoking Status  . Never Smoker  Smokeless Tobacco  . Never Used    Review of Systems  Constitutional: Positive for malaise/fatigue.  HENT: Positive for sore throat.   Eyes: Negative.   Respiratory: Negative.   Cardiovascular: Negative.   Gastrointestinal: Positive for heartburn.  Genitourinary: Negative.   Musculoskeletal: Negative.   Skin: Negative.   Neurological: Negative.   Endo/Heme/Allergies: Negative.   Psychiatric/Behavioral: Negative.     Objective   Vitals:   01/26/17 1203  BP: (!) 120/59  Pulse: 84  Resp: 18  Temp: 99.3 F (37.4 C)    Physical Exam  Constitutional: She is oriented to person, place, and time and well-developed, well-nourished, and in no distress.  HENT:  Head: Normocephalic and atraumatic.  Eyes: No scleral icterus.  Neck: Normal range of motion. Neck supple.  Cardiovascular: Normal rate, regular rhythm and normal heart sounds.   No murmur heard. Pulmonary/Chest: Effort normal and breath sounds normal. She has no wheezes. She has no rales.  Abdominal: Soft. Bowel sounds are normal. She exhibits no distension. There is no tenderness. There is no rebound.  Neurological: She is alert and oriented to person, place, and time.  Skin: Skin is warm and dry.  Vitals reviewed. Dr. Luan Pulling' notes reviewed.  CT scan report reviewed.  Assessment    Cholelithiasis, asymptomatic Plan    I told patient that since she is asymptomatic, cholecystectomy is not warranted at this time.  She was given literature about signs and symptoms of biliary colic.  She will return if she develops these symptoms.  She understands and agrees.

## 2017-02-06 ENCOUNTER — Other Ambulatory Visit (HOSPITAL_COMMUNITY): Payer: Self-pay

## 2017-02-06 DIAGNOSIS — I10 Essential (primary) hypertension: Secondary | ICD-10-CM

## 2017-02-06 MED ORDER — VALSARTAN 40 MG PO TABS: 40.0000 mg | ORAL_TABLET | Freq: Every day | ORAL | 1 refills | Status: DC

## 2017-02-06 NOTE — Telephone Encounter (Signed)
Received refill request from patients pharmacy for valsartan. Reviewed with PA-C, chart checked and refilled.

## 2017-02-07 ENCOUNTER — Inpatient Hospital Stay (HOSPITAL_COMMUNITY): Payer: Medicare Other

## 2017-02-07 ENCOUNTER — Encounter (HOSPITAL_COMMUNITY): Payer: Self-pay

## 2017-02-07 ENCOUNTER — Encounter (HOSPITAL_COMMUNITY): Payer: Medicare Other | Attending: Oncology | Admitting: Oncology

## 2017-02-07 VITALS — BP 148/60 | HR 69 | Temp 97.7°F | Resp 18 | Wt 115.4 lb

## 2017-02-07 DIAGNOSIS — I10 Essential (primary) hypertension: Secondary | ICD-10-CM | POA: Diagnosis not present

## 2017-02-07 DIAGNOSIS — G620 Drug-induced polyneuropathy: Secondary | ICD-10-CM

## 2017-02-07 DIAGNOSIS — Z79811 Long term (current) use of aromatase inhibitors: Secondary | ICD-10-CM

## 2017-02-07 DIAGNOSIS — M810 Age-related osteoporosis without current pathological fracture: Secondary | ICD-10-CM | POA: Diagnosis not present

## 2017-02-07 DIAGNOSIS — Z170 Estrogen receptor positive status [ER+]: Secondary | ICD-10-CM | POA: Diagnosis not present

## 2017-02-07 DIAGNOSIS — C50211 Malignant neoplasm of upper-inner quadrant of right female breast: Secondary | ICD-10-CM | POA: Diagnosis not present

## 2017-02-07 NOTE — Patient Instructions (Signed)
[place] at [facility] Discharge Instructions  RECOMMENDATIONS MADE BY THE CONSULTANT AND ANY TEST RESULTS WILL BE SENT TO YOUR REFERRING PHYSICIAN.  You were seen today by Dr. Twana First We will refer you to Dr. Arnoldo Morale here in [place] to remove your port a cath Follow up in 6 months with lab work See Amy up front for appointments   Thank you for choosing [place] at [facility] to provide your oncology and hematology care.  To afford each patient quality time with our provider, please arrive at least 15 minutes before your scheduled appointment time.    If you have a lab appointment with the [facility] please come in thru the  Main Entrance and check in at the main information desk  You need to re-schedule your appointment should you arrive 10 or more minutes late.  We strive to give you quality time with our providers, and arriving late affects you and other patients whose appointments are after yours.  Also, if you no show three or more times for appointments you may be dismissed from the clinic at the providers discretion.     Again, thank you for choosing [facility].  Our hope is that these requests will decrease the amount of time that you wait before being seen by our physicians.       _____________________________________________________________  Should you have questions after your visit to [facility], please contact our office at [phone] between the hours of 8:30 a.m. and 4:30 p.m.  Voicemails left after 4:30 p.m. will not be returned until the following business day.  For prescription refill requests, have your pharmacy contact our office.       Resources For Cancer Patients and their Caregivers ? American Cancer Society: Can assist with transportation, wigs, general needs, runs Look Good Feel Better.        ([phone] 750-0550 ? Cancer Care: Provides financial assistance, online support  groups, medication/co-pay assistance.  1-800-813-HOPE (715) 019-8871) ? [place] Assists [place] cancer patients and their families through emotional , educational and financial support.  (604)098-3527 ? [place] DSS Where to apply for food stamps, Medicaid and utility assistance. 443-454-3538 ? [employer]: Transportation to medical appointments. 5398222357 ? Social Security Administration: May apply for disability if have a Stage IV cancer. 747-401-3724 229-180-5627 ? [employer], Disability and Transit Services: Assists with nutrition, care and transit needs. [place] Support Programs: @10RELATIVEDAYS @ > Cancer Support Group  2nd Tuesday of the month 1pm-2pm, Journey Room  > Creative Journey  3rd Tuesday of the month 1130am-1pm, Journey Room  > Look Good Feel Better  1st Wednesday of the month 10am-12 noon, Journey Room (Call [place] to register [phone])

## 2017-02-07 NOTE — Progress Notes (Signed)
[place] at [facility] Progress Note  Patient Care Team: Lajean Manes, MD as PCP - General  CHIEF COMPLAINTS:  Invasive ductal carcinoma triple positive  ER+, PR+, HER 2 neu +  RIGHT breast cancer, upper inner quadrant Screening mammogram on 09/04/2015 with possible R breast mass 2 densities noted in the R breast upper inner quadrant within a centimeter of each other (9 and 10 cm from the nipple) 11/03/2015 partial mastectomy, sentinel node biopsy and port placement with Dr. Brantley Stage Final pathology [id number] ER+PR+ Her 2 neu+ R breast carcinoma    Breast cancer of upper-inner quadrant of right female breast ([place])   09/01/2015 Mammogram    BI-RADS CATEGORY 0: Incomplete. Need additional imaging evaluation and/or prior mammograms for comparison.      09/15/2015 Imaging    CT chest- Right lower lobe 4 mm solid pulmonary nodule. If the patient is at high risk for bronchogenic carcinoma, follow-up chest CT at 1 year is recommended.      09/16/2015 Breast US    US showing a small hypoechoic irregular mass with hyperechoic rim in the 1 o'clock location of R breast 10 cm from the nipple. Mass measures 0.7 x 0.5 x 0.6 cm. 2nd mass is identified in the 1 o'clock location 9 cm from the nipple 0.7 x 0.7 x 0.6 cm      09/16/2015 Mammogram    Two suspicious masses in the 1 o'clock location of the right breast warranting tissue diagnosis.  No evidence for adenopathy.      09/21/2015 Pathology Results    Estrogen Receptor: 100%, POSITIVE, STRONG STAINING INTENSITY Progesterone Receptor: 10%, POSITIVE, MODERATE STAINING INTENSITY Proliferation Marker Ki67: 10%.  HER2 - **POSITIVE**      09/21/2015 Mammogram    Appropriate positioning of the 2 biopsy marking clips in the upper-inner quadrant of the right breast at 1 o'clock.      09/21/2015 Initial Biopsy    Breast, right, needle core biopsy, 1:00 o'clock, 9 CMFN - INVASIVE DUCTAL CARCINOMA. - DUCTAL CARCINOMA IN SITU. - SEE  COMMENT. 2. Breast, right, needle core biopsy, 1:00 o'clock, 10 CMFN - INVASIVE DUCTAL CARCINOMA. - DUCTAL CARCINOMA IN SITU WITH       10/26/2015 Procedure    Right chest porta cath placed with no adverse features.- Dr. Brantley Stage.      11/03/2015 Pathology Results    MULTIFOCAL INVASIVE DUCTAL CARCINOMA.  INVASIVE TUMOR IS 1.0 MM FROM NEAREST MARGIN (INFERIOR).  HIGH GRADE DUCTAL CARCINOMA IN SITU WITH NECROSIS.  IN SITU CARCINOMA IS 0.1 MM FROM THE NEAREST MARGIN (INFERIOR MARGIN).      11/03/2015 Procedure    Right lumpectomy by Dr. Brantley Stage.      11/17/2015 Initial Diagnosis    Breast cancer of upper-inner quadrant of right female breast ([place])      11/21/2015 Imaging    Normal left ventricular ejection fraction equal to 71.9%.      11/26/2015 -  Chemotherapy    Paclitaxel weekly x 12       11/26/2015 -  Antibody Plan    Herceptin weekly with Paclitaxel.      02/15/2016 Echocardiogram    MUGA- Normal LEFT ventricular ejection fraction 65% slightly decreased from 72% on the previous exam.       04/18/2016 Imaging    MUGA- Normal left ventricular ejection fraction equal to 70.1%. This has increased from 65.2% previously.       07/26/2016 Echocardiogram    MUGA Left ventricular ejection fraction  equals 73 %      10/27/2016 Imaging    MUGA- Normal LEFT ventricular ejection fraction of 68%, minimally decreased in a 73% on the previous exam.  Normal LEFT ventricular wall motion.      11/15/2016 -  Adjuvant Chemotherapy    Completed 52 weeks of Herceptin.      11/22/2016 Breast US    Ultrasound-guided biopsy of the mass in the right breast at the 11:30 position 3 cm from nipple is recommended. This is scheduled for Tuesday 11/29/2016 at 2 p.m.       HISTORY OF PRESENTING ILLNESS:  Lori Greene 76 y.o. female is here for follow-up of  R breast cancer, disease is ER+, PR+,  HER 2 neu positive. She has completed weekly taxol/herceptin. She has completed XRT.  She has been  doing well. She is tolerating Arimidex well other than some occasional hot flashes. She has been having some pain in her right ear, and states it feels like she's underwater. Denies hearing loss. Denies joint pain, leg swelling, or any other concerns.   MEDICAL HISTORY:  Past Medical History:  Diagnosis Date  . Asthma   . Breast cancer (HCC)    Right breast  . Cancer (HCC) 2016   right breast  . Diabetes mellitus without complication (HCC)   . GERD (gastroesophageal reflux disease)   . Hypertension   . Radiation 03/23/16-05/09/16   45 Gy to right breast, boosted to 16 Gy    SURGICAL HISTORY: Past Surgical History:  Procedure Laterality Date  . ABDOMINAL HYSTERECTOMY  1983  . BREAST LUMPECTOMY WITH RADIOACTIVE SEED AND SENTINEL LYMPH NODE BIOPSY Right 11/03/2015   Procedure: RIGHT BREAST LUMPECTOMY WITH RADIOACTIVE SEED AND SENTINEL LYMPH NODE MAPPING;  Surgeon: Harriette Bouillon, MD;  Location: [facility];  Service: General;  Laterality: Right;  . NASAL FRACTURE SURGERY    . PORTACATH PLACEMENT Right 11/03/2015   Procedure: INSERTION PORT-A-CATH;  Surgeon: Harriette Bouillon, MD;  Location: [facility];  Service: General;  Laterality: Right;    SOCIAL HISTORY: Social History   Social History  . Marital status: Married    Spouse name: N/A  . Number of children: N/A  . Years of education: N/A   Occupational History  . retired    Social History Main Topics  . Smoking status: Never Smoker  . Smokeless tobacco: Never Used  . Alcohol use No  . Drug use: No  . Sexual activity: No   Other Topics Concern  . Not on file   Social History Narrative  . No narrative on file  Married 51 years. 0 children. Fostered children. 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 She likes to sew, knit, and quilt. She likes to walk but does not do it often. Non smoker ETOH, none She is from [facility] and moved to [place] when she got  married.  FAMILY HISTORY: Family History  Problem Relation Age of Onset  . Breast cancer Mother   . Leukemia Father    indicated that the status of her mother is unknown. She indicated that the status of her father is unknown.   Father died at 53 yo of leukemia Mother died of old age; breast cancer diagnosis at 75 yo. She had a biopsy, no treatment. 1 sister, healthy. No breast cancer. Sister with stage IV colon cancer  ALLERGIES:  is allergic to lisinopril; losartan; and penicillins.  MEDICATIONS:  Current Outpatient Prescriptions  Medication Sig Dispense  Refill  . albuterol (PROAIR HFA) 108 (90 BASE) MCG/ACT inhaler Inhale 2 puffs into the lungs every 6 (six) hours as needed for wheezing or shortness of breath.    Marland Kitchen alendronate (FOSAMAX) 70 MG tablet TAKE 1 TABLET BY MOUTH ONCE WEEKLY WITH A FULL GLASS OF WATER ON EMPTY STOMACH 1 tablet 1  . amLODipine (NORVASC) 2.5 MG tablet Take 2.5 mg by mouth daily.    Marland Kitchen anastrozole (ARIMIDEX) 1 MG tablet Take 1 tablet (1 mg total) by mouth daily. 90 tablet 1  . atorvastatin (LIPITOR) 10 MG tablet Take 10 mg by mouth daily.    Marland Kitchen BREO ELLIPTA 100-25 MCG/INH AEPB     . Calcium Carbonate-Vitamin D (CALCIUM + D PO) Take 1 tablet by mouth every morning.    . Cholecalciferol (VITAMIN D3) 2000 units TABS Take by mouth.    . dexamethasone (DECADRON) 4 MG tablet TAKE 2 TABLETS WEEKLY  12  . dextromethorphan-guaiFENesin (MUCINEX DM) 30-600 MG 12hr tablet Take 1 tablet by mouth daily as needed. Reported on 04/18/2016    . doxycycline (VIBRAMYCIN) 100 MG capsule Take 100 mg by mouth 2 (two) times daily.  1  . famotidine (PEPCID) 20 MG tablet Take 20 mg by mouth daily.     Marland Kitchen lidocaine-prilocaine (EMLA) cream Apply a quarter size amount to port site 1 hour prior to chemo. Do not rub in. Cover with plastic wrap. 30 g 3  . metFORMIN (GLUCOPHAGE) 500 MG tablet TAKE 1 TABLET DAILY WITH BREAKFAST (Patient taking differently: 1 tablet BID prior to brakfast and  dinner) 30 tablet 2  . methylPREDNISolone (MEDROL DOSEPAK) 4 MG TBPK tablet Take by mouth daily. Use as directed (Patient not taking: Reported on 01/10/2017) 21 tablet 0  . montelukast (SINGULAIR) 10 MG tablet     . omeprazole (PRILOSEC) 40 MG capsule Take 40 mg by mouth daily.    . ondansetron (ZOFRAN) 8 MG tablet Take 1 tablet (8 mg total) by mouth every 8 (eight) hours as needed for nausea or vomiting. 30 tablet 2  . prochlorperazine (COMPAZINE) 10 MG tablet Take 1 tablet (10 mg total) by mouth every 6 (six) hours as needed for nausea or vomiting. 30 tablet 2  . tiotropium (SPIRIVA) 18 MCG inhalation capsule Place 18 mcg into inhaler and inhale daily.    . Trastuzumab (HERCEPTIN IV) Inject into the vein. To start on November 26, 2015.    . valsartan (DIOVAN) 40 MG tablet Take 1 tablet (40 mg total) by mouth daily. 30 tablet 1   No current facility-administered medications for this visit.     Review of Systems  Constitutional:       Hot flashes  HENT: Positive for ear pain (R). Negative for hearing loss.   Eyes: Negative.   Respiratory: Negative.   Cardiovascular: Negative.  Negative for leg swelling.  Gastrointestinal: Negative.   Genitourinary: Negative.   Musculoskeletal: Negative.  Negative for joint pain.  Skin: Negative.   Neurological: Negative.   Endo/Heme/Allergies: Negative.   Psychiatric/Behavioral: Negative.   All other systems reviewed and are negative. 14 point ROS was done and is otherwise as detailed above or in HPI  PHYSICAL EXAMINATION: ECOG PERFORMANCE STATUS: 0 - Asymptomatic   BP (!) 148/60 (BP Location: Left Arm, Patient Position: Sitting)   Pulse 69   Temp 97.7 F (36.5 C) (Oral)   Resp 18   Wt 115 lb 6.4 oz (52.3 kg)   SpO2 100%   BMI 22.54 kg/m   Physical Exam  Constitutional: She is oriented to person, place, and time and well-developed, well-nourished, and in no distress.  HENT:  Head: Normocephalic and atraumatic.  Nose: Nose normal.    Mouth/Throat: Oropharynx is clear and moist. No oropharyngeal exudate.  Eyes: Conjunctivae and EOM are normal. Pupils are equal, round, and reactive to light. Right eye exhibits no discharge. Left eye exhibits no discharge. No scleral icterus.  Neck: Normal range of motion. Neck supple. No tracheal deviation present. No thyromegaly present.  Cardiovascular: Normal rate, regular rhythm and normal heart sounds.  Exam reveals no gallop and no friction rub.   No murmur heard. Pulmonary/Chest: Effort normal. She has no wheezes. She has no rales. Right breast exhibits no inverted nipple, no mass and no nipple discharge. Left breast exhibits no inverted nipple, no mass and no nipple discharge.    Abdominal: Soft. Bowel sounds are normal. She exhibits no distension and no mass. There is no tenderness. There is no rebound and no guarding.  Musculoskeletal: Normal range of motion. She exhibits no edema.  Lymphadenopathy:    She has no cervical adenopathy.  Neurological: She is alert and oriented to person, place, and time. She has normal reflexes. No cranial nerve deficit. Gait normal. Coordination normal.  Skin: Skin is warm and dry. No rash noted.  Psychiatric: Mood, memory, affect and judgment normal.  Nursing note and vitals reviewed.  LABORATORY DATA:  I have reviewed the data as listed Lab Results  Component Value Date   WBC 7.8 10/24/2016   HGB 11.9 (L) 10/24/2016   HCT 36.6 10/24/2016   MCV 92.4 10/24/2016   PLT 268 10/24/2016   CMP     Component Value Date/Time   NA 139 10/24/2016 1212   K 3.9 10/24/2016 1212   CL 108 10/24/2016 1212   CO2 24 10/24/2016 1212   GLUCOSE 153 (H) 10/24/2016 1212   BUN 10 10/24/2016 1212   CREATININE 1.06 (H) 10/24/2016 1212   CALCIUM 8.9 10/24/2016 1212   PROT 6.3 (L) 10/24/2016 1212   ALBUMIN 3.6 10/24/2016 1212   AST 18 10/24/2016 1212   ALT 12 (L) 10/24/2016 1212   ALKPHOS 109 10/24/2016 1212   BILITOT 1.0 10/24/2016 1212   GFRNONAA 50 (L)  10/24/2016 1212   GFRAA 58 (L) 10/24/2016 1212    RADIOGRAPHIC STUDIES: I have personally reviewed the radiological images as listed and agreed with the findings in the report.  Diagnostic Mammogram 11/22/2016 IMPRESSION: Indeterminate mass in the right breast at 11:30 3 cm from nipple.  PATHOLOGY:   ASSESSMENT & PLAN:  Invasive ductal carcinoma triple positive  ER+ (100%), PR+ (10%), HER 2 neu +  RIGHT breast cancer, upper inner quadrant Screening mammogram on 09/04/2015 with possible R breast mass 2 densities noted in the R breast upper inner quadrant within a centimeter of each other (9 and 10 cm from the nipple) 11/03/2015 partial mastectomy, sentinel node biopsy and port placement with Dr. Brantley Stage Final pathology [id number] ER+PR+ Her 2 neu+ R breast carcinoma Grade I chemotherapy induced neuropathy feet  Cough, wheezing Hypertension DEXA with osteoporosis on fosamax Aromatase inhibitor related joint pain RLL pulmonary nodule Bronchiectasis   I reviewed the pathology results with the patient from her US guided breast biopsy. I have assured her that this is scar tissue without any evidence of malignancy.   Continue arimidex, calcium-vitamin D, and fosamax.  I will refer her to get her port removed by Dr. Arnoldo Morale, since she has completed her year of herceptin.   She  will return for follow up in 6 months with CBC, CMP.   No orders of the defined types were placed in this encounter.   No orders of the defined types were placed in this encounter.  All questions were answered. The patient knows to call the clinic with any problems, questions or concerns.  This document serves as a record of services personally performed by Twana First, MD. It was created on her behalf by Martinique Casey, a trained medical scribe. The creation of this record is based on the scribe's personal observations and the provider's statements to them. This document has been checked and approved by the  attending provider.  I have reviewed the above documentation for accuracy and completeness, and I agree with the above.  This note was electronically signed.   Martinique M Casey  02/07/2017 11:10 AM

## 2017-02-09 ENCOUNTER — Other Ambulatory Visit (HOSPITAL_COMMUNITY): Payer: Self-pay | Admitting: Oncology

## 2017-02-09 DIAGNOSIS — I10 Essential (primary) hypertension: Secondary | ICD-10-CM

## 2017-02-09 MED ORDER — VALSARTAN 40 MG PO TABS: 40.0000 mg | ORAL_TABLET | Freq: Every day | ORAL | 0 refills | Status: DC

## 2017-02-13 ENCOUNTER — Other Ambulatory Visit (HOSPITAL_COMMUNITY): Payer: Self-pay | Admitting: Oncology

## 2017-02-13 DIAGNOSIS — C50211 Malignant neoplasm of upper-inner quadrant of right female breast: Secondary | ICD-10-CM

## 2017-02-17 ENCOUNTER — Other Ambulatory Visit (HOSPITAL_COMMUNITY): Payer: Self-pay | Admitting: Oncology

## 2017-02-26 IMAGING — US US BREAST BX W LOC DEV 1ST LESION IMG BX SPEC US GUIDE*R*
1 series · 10 of 10 positions shown · non-contrast
Comparison: Previous exam(s).

ADDENDUM:
Pathology results of the right breast ultrasound-guided biopsy show
fibrosis and scar. No malignancy identified. Pathology results are
thought to be concordant with the imaging findings. Pathology
results were discussed with the patient by telephone. She reports
doing well after the biopsy. Her questions were answered.

A diagnostic right mammogram and possible right breast ultrasound is
recommended in 6 months.
CLINICAL DATA: Subtle suspicious findings in the superior right
breast identified on recent mammogram and ultrasound, presenting as
a subtle area of architectural distortion in the MLO view, anterior
to the patient's lumpectomy site, and not definitely seen in the CC
or 90 degree lateral views. On ultrasound, a subtle hypoechoic mass
is noted at [DATE] position 3 cm from the nipple. Ultrasound-guided
biopsy was recommended.
The patient has a history of right breast cancer, diagnosed in 6715.
She underwent right breast lumpectomy in October 2015.
EXAM:
ULTRASOUND GUIDED RIGHT BREAST CORE NEEDLE BIOPSY

[Series 1: us breast bx w loc dev 1st lesion img bx spec us g · 0.05mm/px · 10 of 10 slices shown]
[im 1/10]
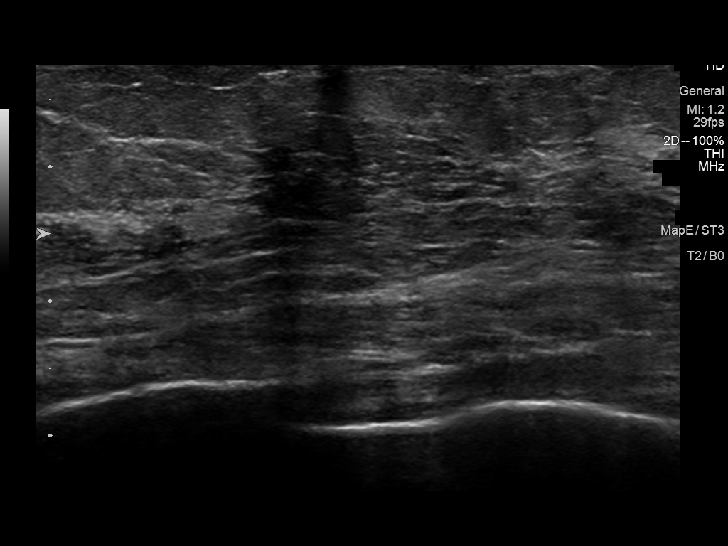
[im 2/10]
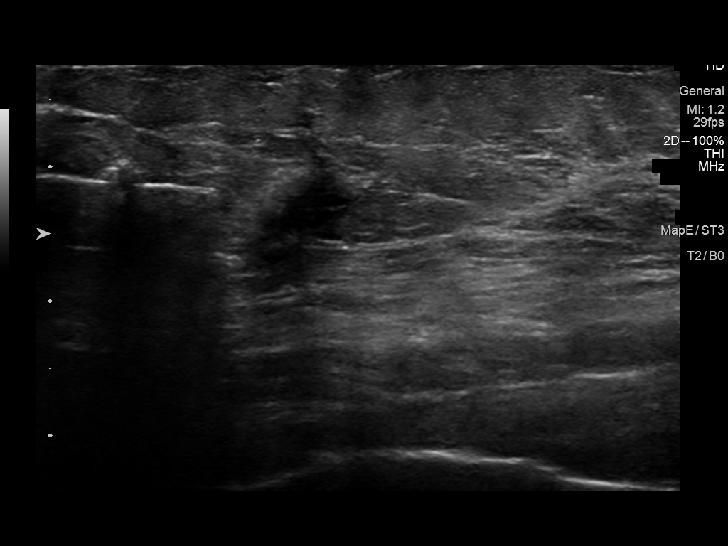
[im 3/10]
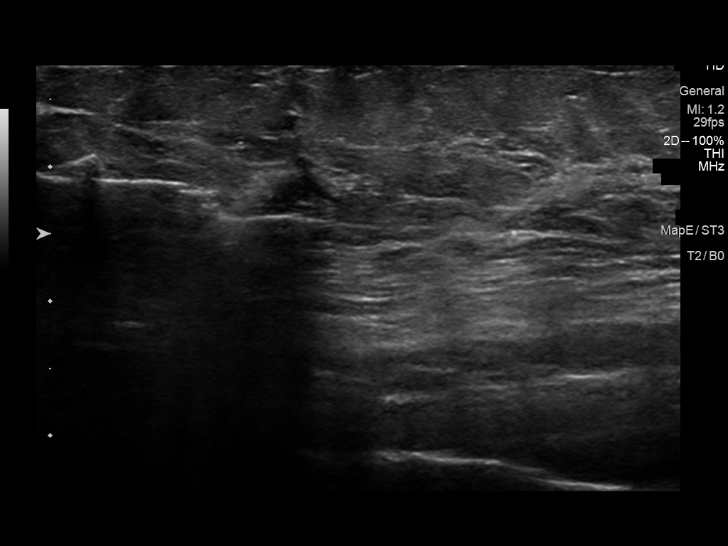
[im 4/10]
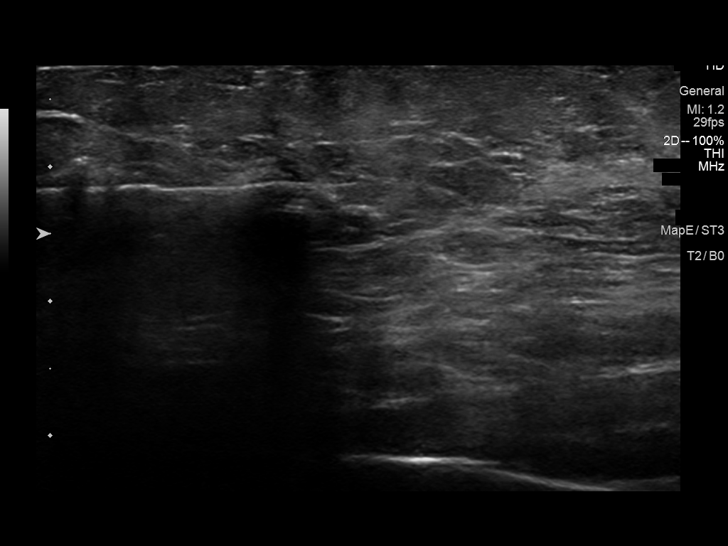
[im 5/10]
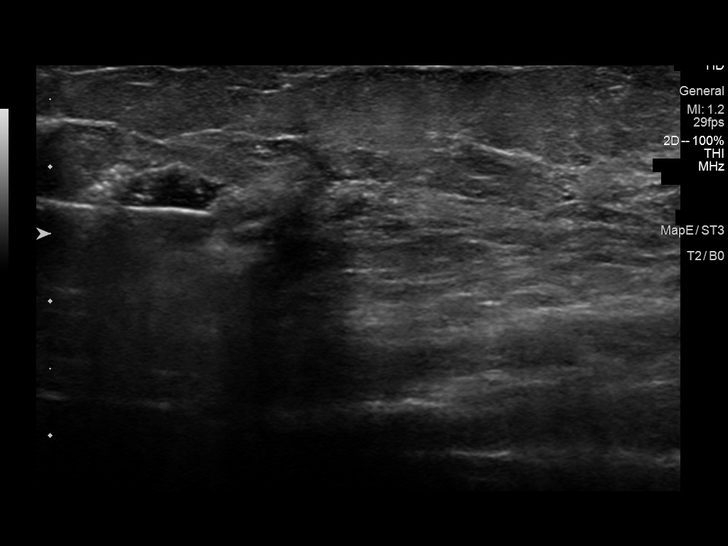
[im 6/10]
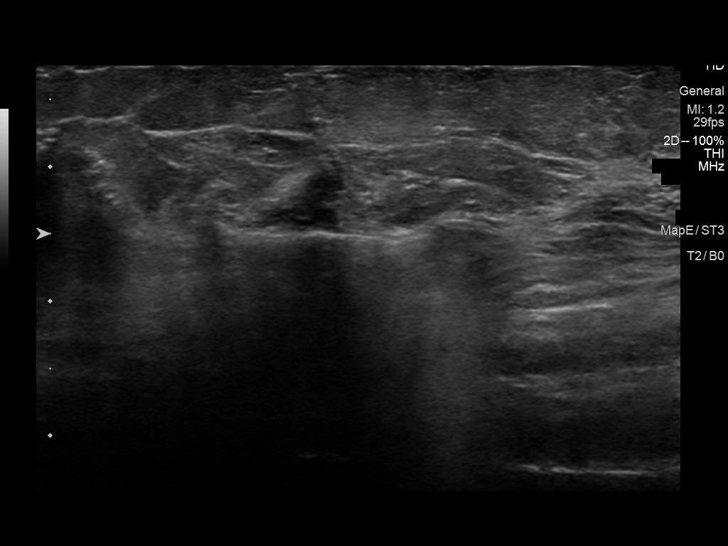
[im 7/10]
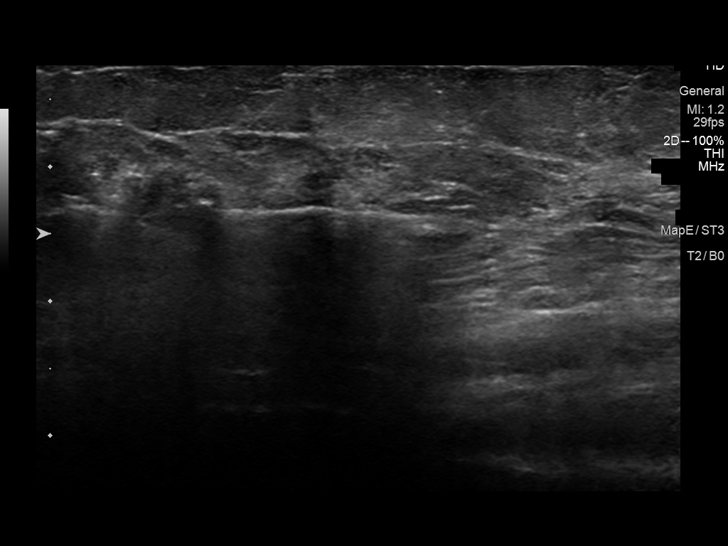
[im 8/10]
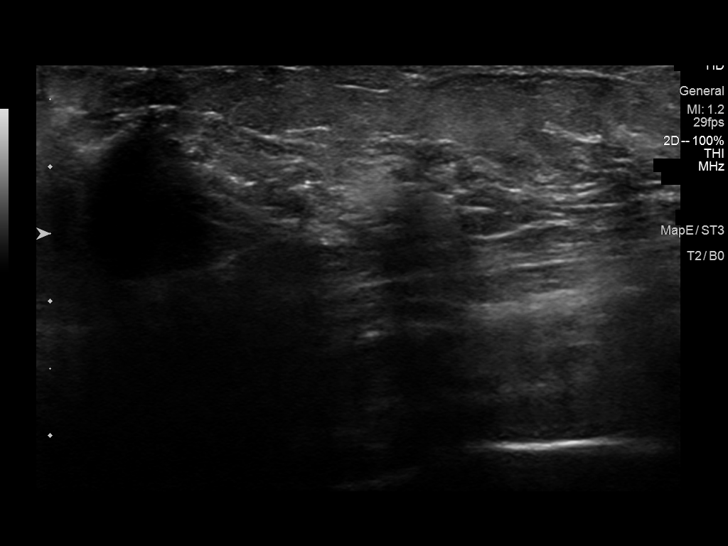
[im 9/10]
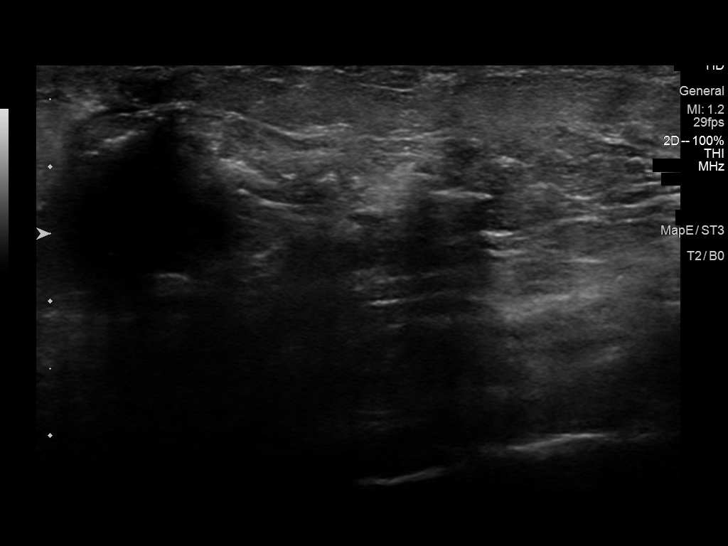
[im 10/10]
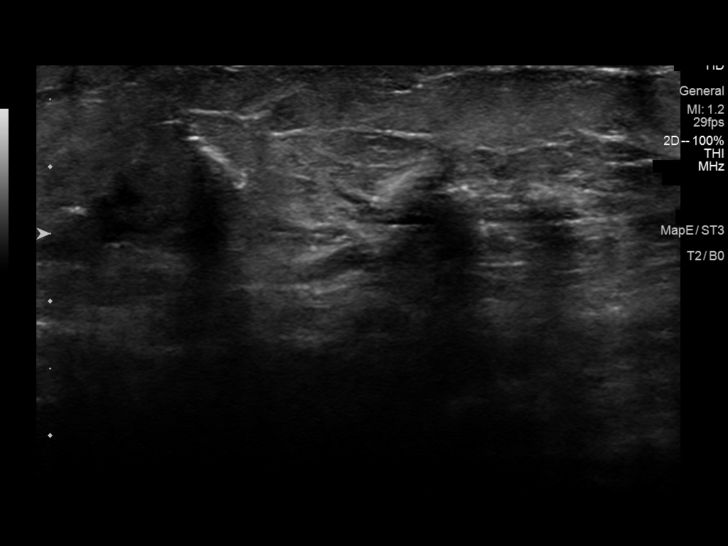

[10 of 10 positions shown; findings below may reference images not displayed]



Using sterile technique and 1% Lidocaine as local anesthetic, under
direct ultrasound visualization, a 12 gauge Holmgren device was
used to perform biopsy of a subtle hypoechoic mass [DATE] position 3
cm from the nipple using a lateral to medial approach. At the
conclusion of the procedure a ribbon shaped tissue marker clip was
deployed into the biopsy cavity. Follow up 2 view mammogram was
performed and dictated separately.
IMPRESSION: Ultrasound guided biopsy of the right breast. No apparent
complications.

## 2017-02-26 IMAGING — MG MM CLIP PLACEMENT
6 series · 6 of 14 positions shown · non-contrast
Comparison: Previous exam(s).

CLINICAL DATA: Ultrasound-guided biopsy was performed of a subtle
mass in the [DATE] position 3 cm from the nipple. The patient has
history of lumpectomy in the deep upper central right breast in
5800.

EXAM:
DIAGNOSTIC RIGHT MAMMOGRAM POST ULTRASOUND BIOPSY

[R CC (1 of 2)]
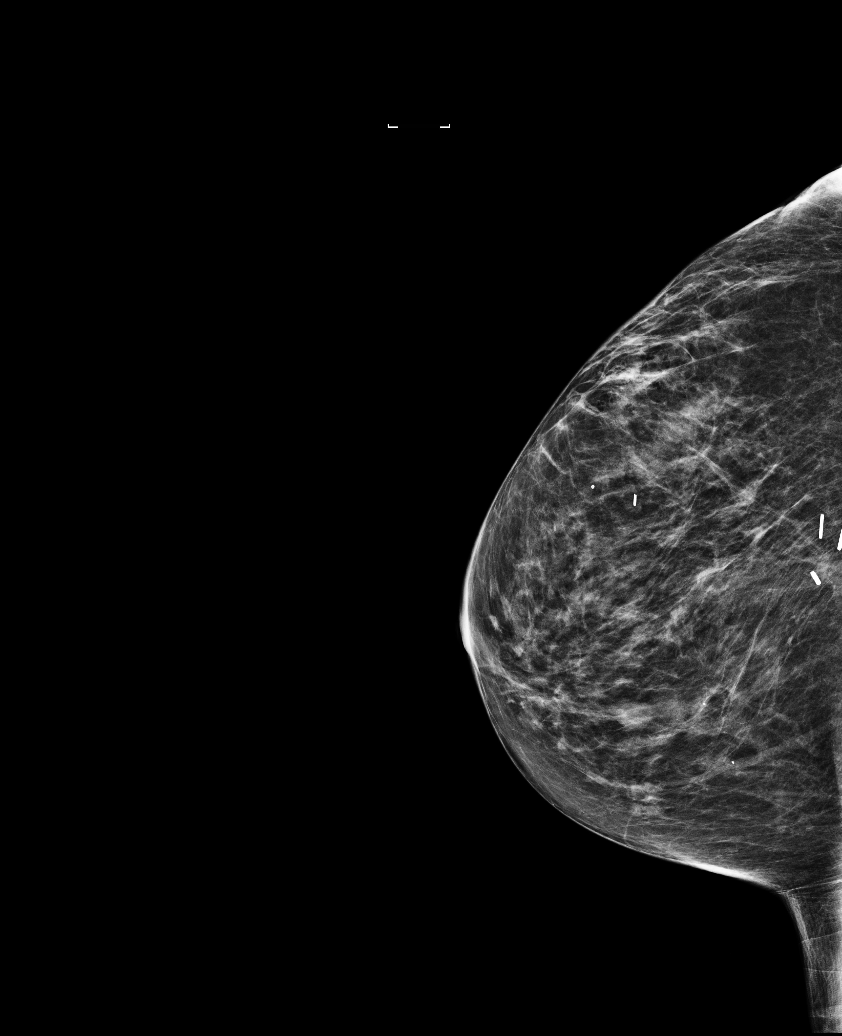

[R ML (1 of 2)]
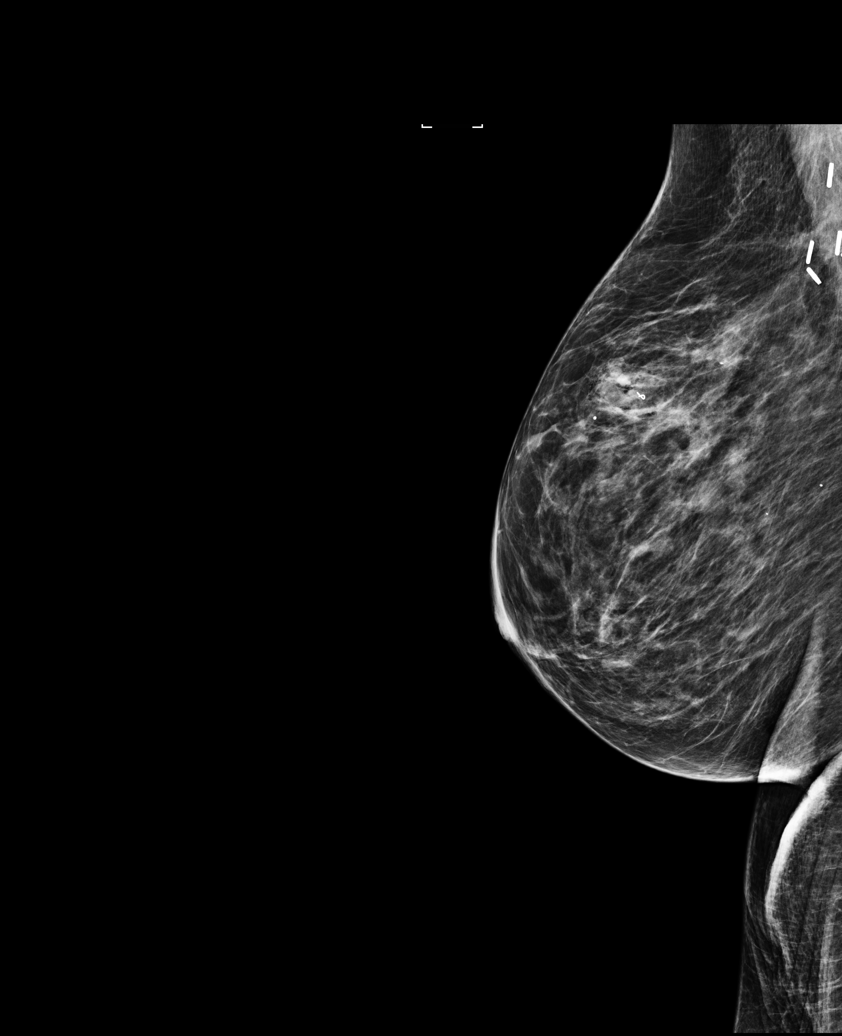

[R ML (2 of 2)]
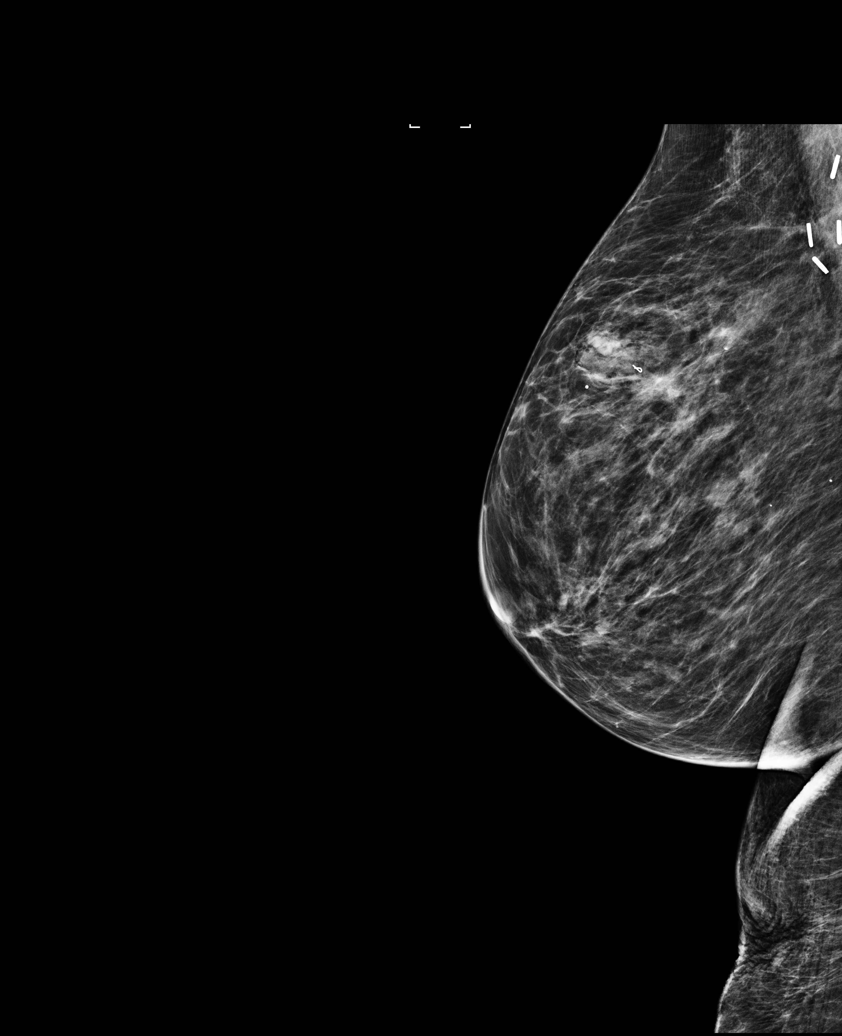

[R CC (2 of 2)]
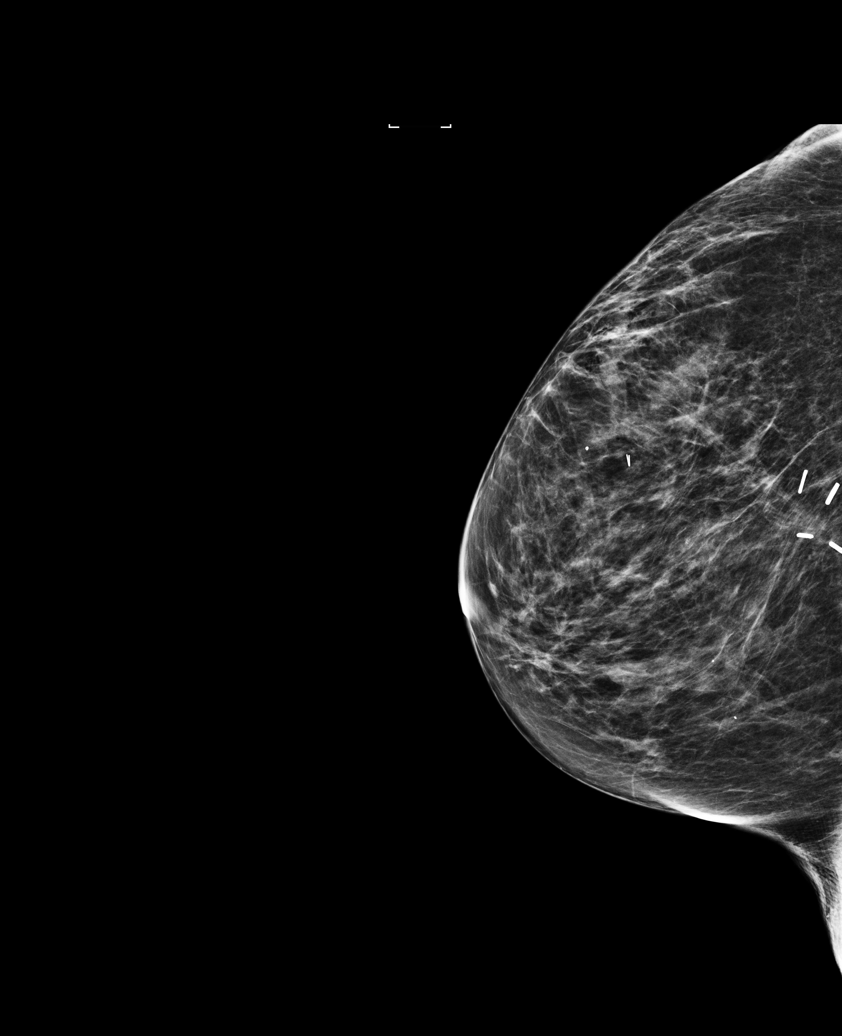

[R CC tomo · tomo slice 25/49.0]
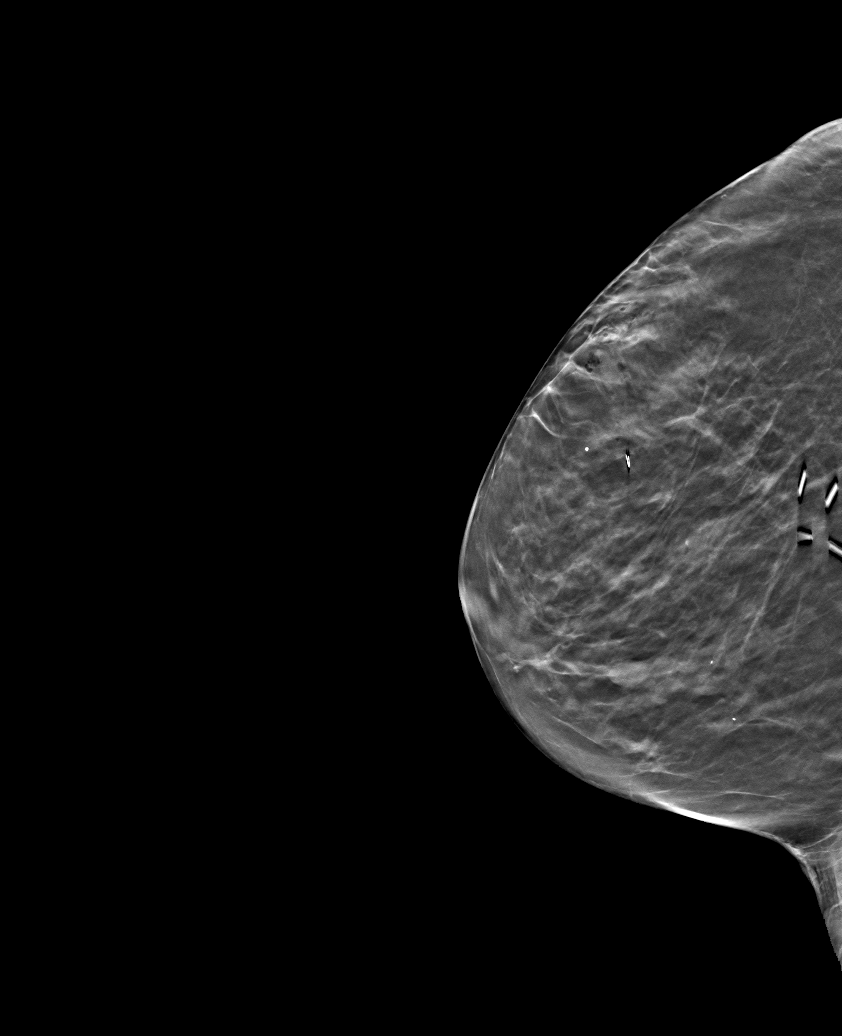

[R ML tomo · tomo slice 27/54.0]
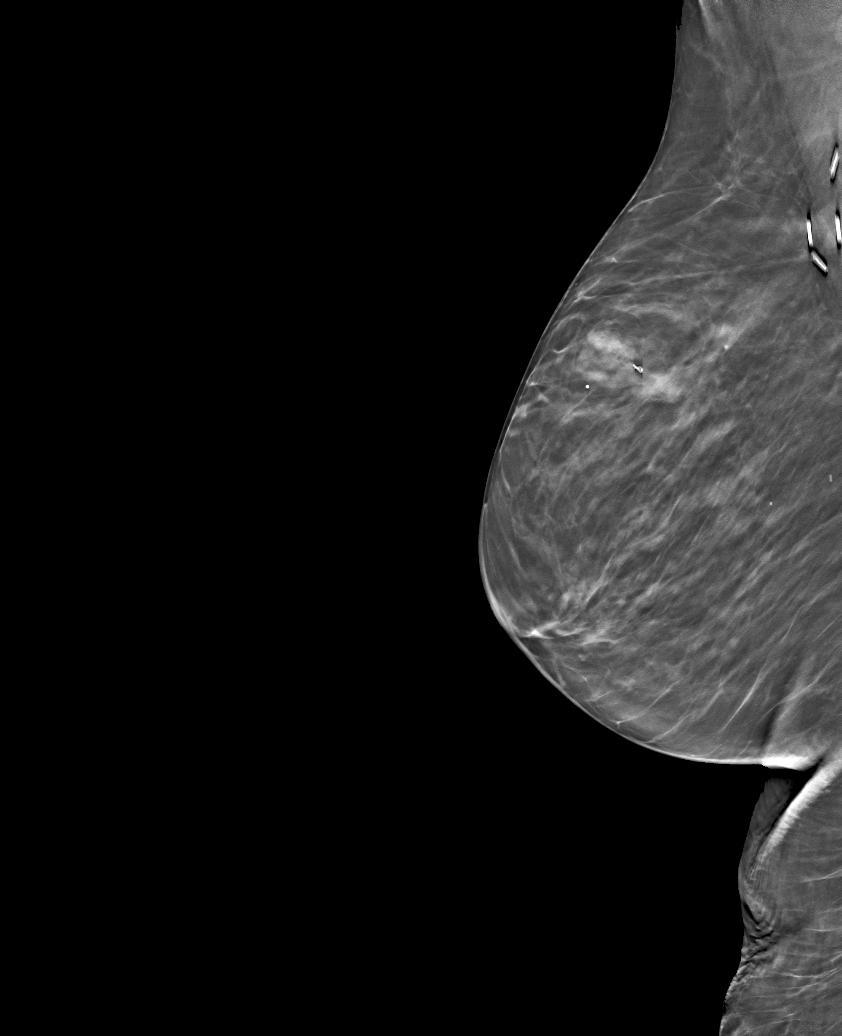

[6 of 14 positions shown; findings below may reference images not displayed]

FINDINGS: Mammographic images were obtained following ultrasound guided biopsy
of a subtle hypoechoic mass [DATE] position 3 cm from the nipple. A
ribbon shaped biopsy clip is positioned in the upper outer right
breast, middle third. The biopsy clip position does project within
the subtle area of architectural distortion in the superior right
breast as seen on the MLO view of the recent diagnostic mammogram.
Again, no definite suspicious findings are seen in the CC projection
in this region.
IMPRESSION: Satisfactory position of ribbon shaped biopsy clip in the right
breast.

Final Assessment: Post Procedure Mammograms for Marker Placement

## 2017-03-07 ENCOUNTER — Encounter (HOSPITAL_COMMUNITY): Payer: Medicare Other

## 2017-03-07 ENCOUNTER — Encounter: Payer: Self-pay | Admitting: General Surgery

## 2017-03-07 ENCOUNTER — Ambulatory Visit (INDEPENDENT_AMBULATORY_CARE_PROVIDER_SITE_OTHER): Payer: Medicare Other | Admitting: General Surgery

## 2017-03-07 VITALS — BP 139/77 | Resp 18 | Ht 60.0 in | Wt 115.0 lb

## 2017-03-07 DIAGNOSIS — C50912 Malignant neoplasm of unspecified site of left female breast: Secondary | ICD-10-CM

## 2017-03-07 NOTE — Progress Notes (Signed)
Lori Greene [id number]; December 27, 1940   HPI Patient is a 76 year old white female with a history of left breast carcinoma who has finished her chemotherapy and is referred by Dr. Talbert Cage of oncology for Port-A-Cath removal. Past Medical History:  Diagnosis Date  . Asthma   . Breast cancer ([place])    Right breast  . Cancer ([place]) 2016   right breast  . Diabetes mellitus without complication ([place])   . GERD (gastroesophageal reflux disease)   . Hypertension   . Radiation 03/23/16-05/09/16   45 Gy to right breast, boosted to 16 Gy    Past Surgical History:  Procedure Laterality Date  . ABDOMINAL HYSTERECTOMY  1983  . BREAST LUMPECTOMY WITH RADIOACTIVE SEED AND SENTINEL LYMPH NODE BIOPSY Right 11/03/2015   Procedure: RIGHT BREAST LUMPECTOMY WITH RADIOACTIVE SEED AND SENTINEL LYMPH NODE MAPPING;  Surgeon: Erroll Luna, MD;  Location: [facility];  Service: General;  Laterality: Right;  . NASAL FRACTURE SURGERY    . PORTACATH PLACEMENT Right 11/03/2015   Procedure: INSERTION PORT-A-CATH;  Surgeon: Erroll Luna, MD;  Location: [place] [facility];  Service: General;  Laterality: Right;    Family History  Problem Relation Age of Onset  . Breast cancer Mother   . Leukemia Father     Current Outpatient Prescriptions on File Prior to Visit  Medication Sig Dispense Refill  . albuterol (PROAIR HFA) 108 (90 BASE) MCG/ACT inhaler Inhale 2 puffs into the lungs every 6 (six) hours as needed for wheezing or shortness of breath.    Marland Kitchen alendronate (FOSAMAX) 70 MG tablet TAKE 1 TABLET BY MOUTH ONCE WEEKLY WITH A FULL GLASS OF WATER ON EMPTY STOMACH 1 tablet 1  . amLODipine (NORVASC) 2.5 MG tablet Take 2.5 mg by mouth daily.    Marland Kitchen amLODipine (NORVASC) 5 MG tablet Take 5 mg by mouth daily.  9  . anastrozole (ARIMIDEX) 1 MG tablet TAKE 1 TABLET (1 MG TOTAL) BY MOUTH DAILY. 90 tablet 1  . atorvastatin (LIPITOR) 10 MG tablet Take 10 mg by mouth daily.    . Calcium Carbonate-Vitamin D  (CALCIUM + D PO) Take 1 tablet by mouth every morning.    . Cholecalciferol (VITAMIN D3) 2000 units TABS Take by mouth.    . dexamethasone (DECADRON) 4 MG tablet TAKE 2 TABLETS WEEKLY  12  . dextromethorphan-guaiFENesin (MUCINEX DM) 30-600 MG 12hr tablet Take 1 tablet by mouth daily as needed. Reported on 04/18/2016    . famotidine (PEPCID) 20 MG tablet Take 20 mg by mouth daily.     Marland Kitchen lidocaine-prilocaine (EMLA) cream Apply a quarter size amount to port site 1 hour prior to chemo. Do not rub in. Cover with plastic wrap. 30 g 3  . metFORMIN (GLUCOPHAGE-XR) 500 MG 24 hr tablet TAKE 2 TABLETS BY MOUTH ONCE A DAY WITH A MEAL  0  . omeprazole (PRILOSEC) 40 MG capsule Take 40 mg by mouth daily.    . ondansetron (ZOFRAN) 8 MG tablet Take 1 tablet (8 mg total) by mouth every 8 (eight) hours as needed for nausea or vomiting. 30 tablet 2  . prochlorperazine (COMPAZINE) 10 MG tablet Take 1 tablet (10 mg total) by mouth every 6 (six) hours as needed for nausea or vomiting. 30 tablet 2  . SYMBICORT 160-4.5 MCG/ACT inhaler Inhale 2 puffs into the lungs 2 (two) times daily.  12  . tiotropium (SPIRIVA) 18 MCG inhalation capsule Place 18 mcg into inhaler and inhale daily.    . valsartan (DIOVAN) 40  MG tablet TAKE 1 TABLET BY MOUTH EVERY DAY 30 tablet 1   No current facility-administered medications on file prior to visit.     Allergies  Allergen Reactions  . Lisinopril Cough  . Losartan Hives    Questionable losartan vs lidocaine reaction  . Penicillins Rash    History  Alcohol Use No    History  Smoking Status  . Never Smoker  Smokeless Tobacco  . Never Used    Review of Systems  Constitutional: Negative.   HENT: Negative.   Eyes: Negative.   Respiratory: Negative.   Cardiovascular: Negative.   Gastrointestinal: Negative.   Genitourinary: Negative.   Musculoskeletal: Negative.   Skin: Negative.   Neurological: Negative.   Endo/Heme/Allergies: Negative.   Psychiatric/Behavioral:  Negative.     Objective   Vitals:   03/07/17 1025  BP: 139/77  Resp: 18    Physical Exam  Constitutional: She is oriented to person, place, and time and well-developed, well-nourished, and in no distress.  HENT:  Head: Normocephalic and atraumatic.  Neck: Normal range of motion. Neck supple.  Cardiovascular: Normal rate, regular rhythm and normal heart sounds.   No murmur heard. Pulmonary/Chest: Effort normal and breath sounds normal.  Port-A-Cath cath in place in right upper chest.  Neurological: She is alert and oriented to person, place, and time.  Skin: Skin is warm and dry.  Vitals reviewed.   Assessment Plan    Left breast carcinoma, finished with chemotherapy   Patient scheduled for Port-A-Cath removal in the minor procedure room on 03/22/2017. The risks and benefits of the procedure were fully explained to the patient, who gave informed consent.

## 2017-03-07 NOTE — H&P (Signed)
**Note De-Identified Lori Obfuscation** Lori Greene [id number]; June 19, 1941   HPI Patient is a 76 year old white female with a history of left breast carcinoma who has finished her chemotherapy and is referred by Dr. Talbert Cage of oncology for Port-A-Cath removal.     Past Medical History:  Diagnosis Date  . Asthma   . Breast cancer ([place])    Right breast  . Cancer ([place]) 2016   right breast  . Diabetes mellitus without complication ([place])   . GERD (gastroesophageal reflux disease)   . Hypertension   . Radiation 03/23/16-05/09/16   45 Gy to right breast, boosted to 16 Gy         Past Surgical History:  Procedure Laterality Date  . ABDOMINAL HYSTERECTOMY  1983  . BREAST LUMPECTOMY WITH RADIOACTIVE SEED AND SENTINEL LYMPH NODE BIOPSY Right 11/03/2015   Procedure: RIGHT BREAST LUMPECTOMY WITH RADIOACTIVE SEED AND SENTINEL LYMPH NODE MAPPING;  Surgeon: Erroll Luna, MD;  Location: [place];  Service: General;  Laterality: Right;  . NASAL FRACTURE SURGERY    . PORTACATH PLACEMENT Right 11/03/2015   Procedure: INSERTION PORT-A-CATH;  Surgeon: Erroll Luna, MD;  Location: [place];  Service: General;  Laterality: Right;         Family History  Problem Relation Age of Onset  . Breast cancer Mother   . Leukemia Father           Current Outpatient Prescriptions on File Prior to Visit  Medication Sig Dispense Refill  . albuterol (PROAIR HFA) 108 (90 BASE) MCG/ACT inhaler Inhale 2 puffs into the lungs every 6 (six) hours as needed for wheezing or shortness of breath.    Marland Kitchen alendronate (FOSAMAX) 70 MG tablet TAKE 1 TABLET BY MOUTH ONCE WEEKLY WITH A FULL GLASS OF WATER ON EMPTY STOMACH 1 tablet 1  . amLODipine (NORVASC) 2.5 MG tablet Take 2.5 mg by mouth daily.    Marland Kitchen amLODipine (NORVASC) 5 MG tablet Take 5 mg by mouth daily.  9  . anastrozole (ARIMIDEX) 1 MG tablet TAKE 1 TABLET (1 MG TOTAL) BY MOUTH DAILY. 90 tablet 1  . atorvastatin (LIPITOR) 10 MG tablet Take 10 mg by  mouth daily.    . Calcium Carbonate-Vitamin D (CALCIUM + D PO) Take 1 tablet by mouth every morning.    . Cholecalciferol (VITAMIN D3) 2000 units TABS Take by mouth.    . dexamethasone (DECADRON) 4 MG tablet TAKE 2 TABLETS WEEKLY  12  . dextromethorphan-guaiFENesin (MUCINEX DM) 30-600 MG 12hr tablet Take 1 tablet by mouth daily as needed. Reported on 04/18/2016    . famotidine (PEPCID) 20 MG tablet Take 20 mg by mouth daily.     Marland Kitchen lidocaine-prilocaine (EMLA) cream Apply a quarter size amount to port site 1 hour prior to chemo. Do not rub in. Cover with plastic wrap. 30 g 3  . metFORMIN (GLUCOPHAGE-XR) 500 MG 24 hr tablet TAKE 2 TABLETS BY MOUTH ONCE A DAY WITH A MEAL  0  . omeprazole (PRILOSEC) 40 MG capsule Take 40 mg by mouth daily.    . ondansetron (ZOFRAN) 8 MG tablet Take 1 tablet (8 mg total) by mouth every 8 (eight) hours as needed for nausea or vomiting. 30 tablet 2  . prochlorperazine (COMPAZINE) 10 MG tablet Take 1 tablet (10 mg total) by mouth every 6 (six) hours as needed for nausea or vomiting. 30 tablet 2  . SYMBICORT 160-4.5 MCG/ACT inhaler Inhale 2 puffs into the lungs 2 (two) times daily.  12  . tiotropium (  SPIRIVA) 18 MCG inhalation capsule Place 18 mcg into inhaler and inhale daily.    . valsartan (DIOVAN) 40 MG tablet TAKE 1 TABLET BY MOUTH EVERY DAY 30 tablet 1   No current facility-administered medications on file prior to visit.          Allergies  Allergen Reactions  . Lisinopril Cough  . Losartan Hives    Questionable losartan vs lidocaine reaction  . Penicillins Rash       History  Alcohol Use No       History  Smoking Status  . Never Smoker  Smokeless Tobacco  . Never Used    Review of Systems  Constitutional: Negative.   HENT: Negative.   Eyes: Negative.   Respiratory: Negative.   Cardiovascular: Negative.   Gastrointestinal: Negative.   Genitourinary: Negative.   Musculoskeletal: Negative.   Skin: Negative.    Neurological: Negative.   Endo/Heme/Allergies: Negative.   Psychiatric/Behavioral: Negative.     Objective      Vitals:   03/07/17 1025  BP: 139/77  Resp: 18    Physical Exam  Constitutional: She is oriented to person, place, and time and well-developed, well-nourished, and in no distress.  HENT:  Head: Normocephalic and atraumatic.  Neck: Normal range of motion. Neck supple.  Cardiovascular: Normal rate, regular rhythm and normal heart sounds.   No murmur heard. Pulmonary/Chest: Effort normal and breath sounds normal.  Port-A-Cath cath in place in right upper chest.  Neurological: She is alert and oriented to person, place, and time.  Skin: Skin is warm and dry.  Vitals reviewed.   Assessment Plan    Left breast carcinoma, finished with chemotherapy   Patient scheduled for Port-A-Cath removal in the minor procedure room on 03/22/2017. The risks and benefits of the procedure were fully explained to the patient, who gave informed consent.

## 2017-03-07 NOTE — Patient Instructions (Signed)
Implanted Port Removal Implanted port removal is a procedure to remove the port and catheter (port-a-cath) that is implanted under your skin. The port is a small disc under your skin that can be punctured with a needle. It is connected to a vein in your chest or neck by a small flexible tube (catheter). The port-a-cath is used for treatment through an IV tube and for taking blood samples. Your health care provider will remove the port-a-cath if:  You no longer need it for treatment.  It is not working properly.  The area around it gets infected. Tell a health care provider about:  Any allergies you have.  All medicines you are taking, including vitamins, herbs, eye drops, creams, and over-the-counter medicines.  Any problems you or family members have had with anesthetic medicines.  Any blood disorders you have.  Any surgeries you have had.  Any medical conditions you have.  Whether you are pregnant or may be pregnant. What are the risks? Generally, this is a safe procedure. However, problems may occur, including:  Infection.  Bleeding.  Allergic reactions to anesthetic medicines.  Damage to nerves or blood vessels. What happens before the procedure?  You will have:  A physical exam.  Blood tests.  Imaging tests, including a chest X-ray.  Follow instructions from your health care provider about eating or drinking restrictions.  Ask your health care provider about:  Changing or stopping your regular medicines. This is especially important if you are taking diabetes medicines or blood thinners.  Taking medicines such as aspirin and ibuprofen. These medicines can thin your blood. Do not take these medicines before your procedure if your surgeon instructs you not to.  Ask your health care provider how your surgical site will be marked or identified.  You may be given antibiotic medicine to help prevent infection.  Plan to have someone take you home after the  procedure.  If you will be going home right after the procedure, plan to have someone stay with you for 24 hours. What happens during the procedure?  To reduce your risk of infection:  Your health care team will wash or sanitize their hands.  Your skin will be washed with soap.  You may be given one or more of the following:  A medicine to help you relax (sedative).  A medicine to numb the area (local anesthetic).  A small cut (incision) will be made at the site of your port-a-cath.  The port-a-cath and the catheter that has been inside your vein will gently be removed.  The incision will be closed with stitches (sutures), adhesive strips, or skin glue.  A bandage (dressing) will be placed over the incision. The procedure may vary among health care providers and hospitals. What happens after the procedure?  Your blood pressure, heart rate, breathing rate, and blood oxygen level will be monitored often until the medicines you were given have worn off.  Do not drive for 24 hours if you received a sedative. This information is not intended to replace advice given to you by your health care provider. Make sure you discuss any questions you have with your health care provider. Document Released: 10/12/2015 Document Revised: 04/07/2016 Document Reviewed: 08/05/2015 Elsevier Interactive Patient Education  2017 Elsevier Inc.  

## 2017-03-22 ENCOUNTER — Ambulatory Visit (HOSPITAL_COMMUNITY)
Admission: RE | Admit: 2017-03-22 | Discharge: 2017-03-22 | Disposition: A | Discharge status: Home / Self Care | Payer: Medicare Other | Source: Ambulatory Visit | Attending: General Surgery | Admitting: General Surgery

## 2017-03-22 ENCOUNTER — Encounter (HOSPITAL_COMMUNITY)
Admission: RE | Disposition: A | Discharge status: Home / Self Care | Payer: Self-pay | Source: Ambulatory Visit | Attending: General Surgery

## 2017-03-22 ENCOUNTER — Encounter (HOSPITAL_COMMUNITY): Payer: Self-pay | Admitting: *Deleted

## 2017-03-22 DIAGNOSIS — Z7952 Long term (current) use of systemic steroids: Secondary | ICD-10-CM | POA: Insufficient documentation

## 2017-03-22 DIAGNOSIS — Z7951 Long term (current) use of inhaled steroids: Secondary | ICD-10-CM | POA: Diagnosis not present

## 2017-03-22 DIAGNOSIS — K219 Gastro-esophageal reflux disease without esophagitis: Secondary | ICD-10-CM | POA: Insufficient documentation

## 2017-03-22 DIAGNOSIS — Z7983 Long term (current) use of bisphosphonates: Secondary | ICD-10-CM | POA: Insufficient documentation

## 2017-03-22 DIAGNOSIS — C50211 Malignant neoplasm of upper-inner quadrant of right female breast: Secondary | ICD-10-CM

## 2017-03-22 DIAGNOSIS — Z9221 Personal history of antineoplastic chemotherapy: Secondary | ICD-10-CM | POA: Diagnosis not present

## 2017-03-22 DIAGNOSIS — Z7984 Long term (current) use of oral hypoglycemic drugs: Secondary | ICD-10-CM | POA: Diagnosis not present

## 2017-03-22 DIAGNOSIS — E119 Type 2 diabetes mellitus without complications: Secondary | ICD-10-CM | POA: Insufficient documentation

## 2017-03-22 DIAGNOSIS — Z853 Personal history of malignant neoplasm of breast: Secondary | ICD-10-CM | POA: Diagnosis not present

## 2017-03-22 DIAGNOSIS — I10 Essential (primary) hypertension: Secondary | ICD-10-CM | POA: Diagnosis not present

## 2017-03-22 DIAGNOSIS — Z452 Encounter for adjustment and management of vascular access device: Secondary | ICD-10-CM | POA: Insufficient documentation

## 2017-03-22 DIAGNOSIS — J45909 Unspecified asthma, uncomplicated: Secondary | ICD-10-CM | POA: Diagnosis not present

## 2017-03-22 DIAGNOSIS — Z923 Personal history of irradiation: Secondary | ICD-10-CM | POA: Diagnosis not present

## 2017-03-22 DIAGNOSIS — Z79899 Other long term (current) drug therapy: Secondary | ICD-10-CM | POA: Diagnosis not present

## 2017-03-22 HISTORY — PX: PORT-A-CATH REMOVAL: SHX5289

## 2017-03-22 SURGERY — MINOR REMOVAL PORT-A-CATH
Anesthesia: LOCAL | Laterality: Left

## 2017-03-22 MED ORDER — LIDOCAINE HCL (PF) 1 % IJ SOLN
INTRAMUSCULAR | Status: AC
  Filled 2017-03-22: qty 30

## 2017-03-22 MED ORDER — LIDOCAINE HCL (PF) 1 % IJ SOLN
INTRAMUSCULAR | Status: DC | PRN
  Administered 2017-03-22: 3 mg

## 2017-03-22 MED ORDER — CHLORHEXIDINE GLUCONATE CLOTH 2 % EX PADS: 6.0000 | MEDICATED_PAD | Freq: Once | CUTANEOUS | Status: DC

## 2017-03-22 SURGICAL SUPPLY — 20 items
CHLORAPREP W/TINT 10.5 ML (MISCELLANEOUS) ×2 IMPLANT
CLOTH BEACON ORANGE TIMEOUT ST (SAFETY) ×2 IMPLANT
DECANTER SPIKE VIAL GLASS SM (MISCELLANEOUS) ×2 IMPLANT
DERMABOND ADVANCED (GAUZE/BANDAGES/DRESSINGS) ×1
DERMABOND ADVANCED .7 DNX12 (GAUZE/BANDAGES/DRESSINGS) ×1 IMPLANT
DRAPE PROXIMA HALF (DRAPES) ×2 IMPLANT
ELECT REM PT RETURN 9FT ADLT (ELECTROSURGICAL) ×2
ELECTRODE REM PT RTRN 9FT ADLT (ELECTROSURGICAL) ×1 IMPLANT
GLOVE BIOGEL PI IND STRL 7.0 (GLOVE) ×1 IMPLANT
GLOVE BIOGEL PI INDICATOR 7.0 (GLOVE) ×1
GLOVE SURG SS PI 7.5 STRL IVOR (GLOVE) ×2 IMPLANT
GOWN STRL REUS W/TWL LRG LVL3 (GOWN DISPOSABLE) ×2 IMPLANT
NEEDLE HYPO 25X1 1.5 SAFETY (NEEDLE) ×2 IMPLANT
PENCIL HANDSWITCHING (ELECTRODE) ×2 IMPLANT
SPONGE GAUZE 2X2 8PLY STRL LF (GAUZE/BANDAGES/DRESSINGS) ×2 IMPLANT
SUT VIC AB 3-0 SH 27 (SUTURE) ×1
SUT VIC AB 3-0 SH 27X BRD (SUTURE) ×1 IMPLANT
SUT VIC AB 4-0 PS2 27 (SUTURE) ×2 IMPLANT
SYR CONTROL 10ML LL (SYRINGE) ×2 IMPLANT
TOWEL OR 17X26 4PK STRL BLUE (TOWEL DISPOSABLE) ×2 IMPLANT

## 2017-03-22 NOTE — Interval H&P Note (Signed)
History and Physical Interval Note:  03/22/2017 9:16 AM  Lori Greene  has presented today for surgery, with the diagnosis of left breast cancer  The various methods of treatment have been discussed with the patient and family. After consideration of risks, benefits and other options for treatment, the patient has consented to  Procedure(s): MINOR REMOVAL PORT-A-CATH (Left) as a surgical intervention .  The patient's history has been reviewed, patient examined, no change in status, stable for surgery.  I have reviewed the patient's chart and labs.  Questions were answered to the patient's satisfaction.     Aviva Signs

## 2017-03-22 NOTE — Op Note (Addendum)
Patient:  Lori Greene  DOB:  03/18/1941  MRN:  [id number]   Preop Diagnosis:  Breast carcinoma, finished with chemotherapy  Postop Diagnosis:  Same  Procedure:  Port-A-Cath removal  Surgeon:  Aviva Signs, M.D.  Anes:  Local  Indications:  Patient is a 76 year old white female who has finished chemotherapy for right breast cancer and now presents for Port-A-Cath removal. The risks and benefits of the procedure were fully explained to the patient, who gave informed consent.  Procedure note:  The patient was placed in the supine position. The right upper chest was prepped and draped using the usual sterile technique with DuraPrep. Surgical site confirmation was performed. One percent Xylocaine was used for local anesthesia.  An incision was made through the previous surgical incision site. This was taken down to the port. The Port-A-Cath was removed in total without difficulty. It was disposed of. The subcutaneous layer was reapproximated using a 3-0 Vicryl interrupted suture. The skin was closed using a 4-0 Vicryl subcuticular suture. Dermabond was applied.  All tape and needle counts were correct at the end of the procedure. The patient was discharged from the minor procedure room in good and stable condition.  Complications:  None  EBL:  Minimal  Specimen:  None

## 2017-03-22 NOTE — Discharge Instructions (Signed)
Implanted Port Removal, Care After °Refer to this sheet in the next few weeks. These instructions provide you with information about caring for yourself after your procedure. Your health care provider may also give you more specific instructions. Your treatment has been planned according to current medical practices, but problems sometimes occur. Call your health care provider if you have any problems or questions after your procedure. °What can I expect after the procedure? °After the procedure, it is common to have: °· Soreness or pain near your incision. °· Some swelling or bruising near your incision. °Follow these instructions at home: °Medicines  °· Take over-the-counter and prescription medicines only as told by your health care provider. °· If you were prescribed an antibiotic medicine, take it as told by your health care provider. Do not stop taking the antibiotic even if you start to feel better. °Bathing  °· Do not take baths, swim, or use a hot tub until your health care provider approves. Ask your health care provider if you can take showers. You may only be allowed to take sponge baths for bathing. °Incision care  °· Follow instructions from your health care provider about how to take care of your incision. Make sure you: °¨ Wash your hands with soap and water before you change your bandage (dressing). If soap and water are not available, use hand sanitizer. °¨ Change your dressing as told by your health care provider. °¨ Keep your dressing dry. °¨ Leave stitches (sutures), skin glue, or adhesive strips in place. These skin closures may need to stay in place for 2 weeks or longer. If adhesive strip edges start to loosen and curl up, you may trim the loose edges. Do not remove adhesive strips completely unless your health care provider tells you to do that. °· Check your incision area every day for signs of infection. Check for: °¨ More redness, swelling, or pain. °¨ More fluid or  blood. °¨ Warmth. °¨ Pus or a bad smell. °Driving  °· If you received a sedative, do not drive for 24 hours after the procedure. °· If you did not receive a sedative, ask your health care provider when it is safe to drive. °Activity  °· Return to your normal activities as told by your health care provider. Ask your health care provider what activities are safe for you. °· Until your health care provider says it is safe: °¨ Do not lift anything that is heavier than 10 lb (4.5 kg). °¨ Do not do activities that involve lifting your arms over your head. °General instructions  °· Do not use any tobacco products, such as cigarettes, chewing tobacco, and e-cigarettes. Tobacco can delay healing. If you need help quitting, ask your health care provider. °· Keep all follow-up visits as told by your health care provider. This is important. °Contact a health care provider if: °· You have more redness, swelling, or pain around your incision. °· You have more fluid or blood coming from your incision. °· Your incision feels warm to the touch. °· You have pus or a bad smell coming from your incision. °· You have a fever. °· You have pain that is not relieved by your pain medicine. °Get help right away if: °· You have chest pain. °· You have difficulty breathing. °This information is not intended to replace advice given to you by your health care provider. Make sure you discuss any questions you have with your health care provider. °Document Released: 10/12/2015 Document Revised: 04/07/2016 Document   Reviewed: 08/05/2015 °Elsevier Interactive Patient Education © 2017 Elsevier Inc. ° °

## 2017-03-23 ENCOUNTER — Encounter (HOSPITAL_COMMUNITY): Payer: Self-pay | Admitting: General Surgery

## 2017-03-27 DIAGNOSIS — Z7984 Long term (current) use of oral hypoglycemic drugs: Secondary | ICD-10-CM | POA: Diagnosis not present

## 2017-03-27 DIAGNOSIS — Z79899 Other long term (current) drug therapy: Secondary | ICD-10-CM | POA: Diagnosis not present

## 2017-03-27 DIAGNOSIS — E1121 Type 2 diabetes mellitus with diabetic nephropathy: Secondary | ICD-10-CM | POA: Diagnosis not present

## 2017-04-03 DIAGNOSIS — K210 Gastro-esophageal reflux disease with esophagitis: Secondary | ICD-10-CM | POA: Diagnosis not present

## 2017-04-03 DIAGNOSIS — J449 Chronic obstructive pulmonary disease, unspecified: Secondary | ICD-10-CM | POA: Diagnosis not present

## 2017-04-03 DIAGNOSIS — I10 Essential (primary) hypertension: Secondary | ICD-10-CM | POA: Diagnosis not present

## 2017-04-03 DIAGNOSIS — E119 Type 2 diabetes mellitus without complications: Secondary | ICD-10-CM | POA: Diagnosis not present

## 2017-04-12 ENCOUNTER — Other Ambulatory Visit (HOSPITAL_COMMUNITY): Payer: Self-pay | Admitting: Oncology

## 2017-04-12 DIAGNOSIS — I10 Essential (primary) hypertension: Secondary | ICD-10-CM

## 2017-04-18 DIAGNOSIS — M810 Age-related osteoporosis without current pathological fracture: Secondary | ICD-10-CM | POA: Diagnosis not present

## 2017-04-18 DIAGNOSIS — Z7984 Long term (current) use of oral hypoglycemic drugs: Secondary | ICD-10-CM | POA: Diagnosis not present

## 2017-04-18 DIAGNOSIS — J45909 Unspecified asthma, uncomplicated: Secondary | ICD-10-CM | POA: Diagnosis not present

## 2017-04-18 DIAGNOSIS — C50911 Malignant neoplasm of unspecified site of right female breast: Secondary | ICD-10-CM | POA: Diagnosis not present

## 2017-04-18 DIAGNOSIS — N183 Chronic kidney disease, stage 3 (moderate): Secondary | ICD-10-CM | POA: Diagnosis not present

## 2017-04-18 DIAGNOSIS — I129 Hypertensive chronic kidney disease with stage 1 through stage 4 chronic kidney disease, or unspecified chronic kidney disease: Secondary | ICD-10-CM | POA: Diagnosis not present

## 2017-04-18 DIAGNOSIS — E1121 Type 2 diabetes mellitus with diabetic nephropathy: Secondary | ICD-10-CM | POA: Diagnosis not present

## 2017-04-18 DIAGNOSIS — J479 Bronchiectasis, uncomplicated: Secondary | ICD-10-CM | POA: Diagnosis not present

## 2017-04-19 DIAGNOSIS — R509 Fever, unspecified: Secondary | ICD-10-CM | POA: Diagnosis not present

## 2017-04-19 DIAGNOSIS — S30860A Insect bite (nonvenomous) of lower back and pelvis, initial encounter: Secondary | ICD-10-CM | POA: Diagnosis not present

## 2017-04-19 DIAGNOSIS — M791 Myalgia: Secondary | ICD-10-CM | POA: Diagnosis not present

## 2017-04-26 DIAGNOSIS — R21 Rash and other nonspecific skin eruption: Secondary | ICD-10-CM | POA: Diagnosis not present

## 2017-04-26 DIAGNOSIS — W57XXXD Bitten or stung by nonvenomous insect and other nonvenomous arthropods, subsequent encounter: Secondary | ICD-10-CM | POA: Diagnosis not present

## 2017-04-26 DIAGNOSIS — R509 Fever, unspecified: Secondary | ICD-10-CM | POA: Diagnosis not present

## 2017-06-12 DIAGNOSIS — Z853 Personal history of malignant neoplasm of breast: Secondary | ICD-10-CM | POA: Diagnosis not present

## 2017-06-25 ENCOUNTER — Other Ambulatory Visit (HOSPITAL_COMMUNITY): Payer: Self-pay | Admitting: Oncology

## 2017-06-25 DIAGNOSIS — I10 Essential (primary) hypertension: Secondary | ICD-10-CM

## 2017-07-02 ENCOUNTER — Other Ambulatory Visit (HOSPITAL_COMMUNITY): Payer: Self-pay | Admitting: Hematology & Oncology

## 2017-07-02 DIAGNOSIS — C50211 Malignant neoplasm of upper-inner quadrant of right female breast: Secondary | ICD-10-CM

## 2017-07-02 DIAGNOSIS — Z170 Estrogen receptor positive status [ER+]: Principal | ICD-10-CM

## 2017-08-08 ENCOUNTER — Other Ambulatory Visit (HOSPITAL_COMMUNITY): Payer: Self-pay | Admitting: *Deleted

## 2017-08-08 DIAGNOSIS — C50211 Malignant neoplasm of upper-inner quadrant of right female breast: Secondary | ICD-10-CM

## 2017-08-08 DIAGNOSIS — Z170 Estrogen receptor positive status [ER+]: Principal | ICD-10-CM

## 2017-08-09 ENCOUNTER — Encounter (HOSPITAL_COMMUNITY): Payer: Self-pay | Admitting: Oncology

## 2017-08-09 ENCOUNTER — Encounter (HOSPITAL_COMMUNITY): Payer: Medicare Other

## 2017-08-09 ENCOUNTER — Encounter (HOSPITAL_COMMUNITY): Payer: Medicare Other | Attending: Oncology | Admitting: Oncology

## 2017-08-09 VITALS — BP 147/67 | HR 92 | Temp 98.4°F | Resp 18 | Wt 119.8 lb

## 2017-08-09 DIAGNOSIS — Z170 Estrogen receptor positive status [ER+]: Secondary | ICD-10-CM | POA: Diagnosis not present

## 2017-08-09 DIAGNOSIS — C50211 Malignant neoplasm of upper-inner quadrant of right female breast: Secondary | ICD-10-CM | POA: Diagnosis not present

## 2017-08-09 DIAGNOSIS — M810 Age-related osteoporosis without current pathological fracture: Secondary | ICD-10-CM

## 2017-08-09 DIAGNOSIS — Z79811 Long term (current) use of aromatase inhibitors: Secondary | ICD-10-CM

## 2017-08-09 LAB — CBC WITH DIFFERENTIAL/PLATELET
Basophils Absolute: 0 10*3/uL (ref 0.0–0.1)
Basophils Relative: 0 %
EOS ABS: 0.1 10*3/uL (ref 0.0–0.7)
Eosinophils Relative: 1 %
HCT: 37.4 % (ref 36.0–46.0)
HEMOGLOBIN: 12.2 g/dL (ref 12.0–15.0)
LYMPHS ABS: 0.7 10*3/uL (ref 0.7–4.0)
LYMPHS PCT: 6 %
MCH: 30.7 pg (ref 26.0–34.0)
MCHC: 32.6 g/dL (ref 30.0–36.0)
MCV: 94 fL (ref 78.0–100.0)
Monocytes Absolute: 0.1 10*3/uL (ref 0.1–1.0)
Monocytes Relative: 1 %
NEUTROS ABS: 9.9 10*3/uL — AB (ref 1.7–7.7)
NEUTROS PCT: 92 %
Platelets: 232 10*3/uL (ref 150–400)
RBC: 3.98 MIL/uL (ref 3.87–5.11)
RDW: 12.8 % (ref 11.5–15.5)
WBC: 10.8 10*3/uL — AB (ref 4.0–10.5)

## 2017-08-09 LAB — COMPREHENSIVE METABOLIC PANEL
ALK PHOS: 67 U/L (ref 38–126)
ALT: 14 U/L (ref 14–54)
AST: 26 U/L (ref 15–41)
Albumin: 3.8 g/dL (ref 3.5–5.0)
Anion gap: 13 (ref 5–15)
BUN: 10 mg/dL (ref 6–20)
CALCIUM: 8.9 mg/dL (ref 8.9–10.3)
CO2: 20 mmol/L — AB (ref 22–32)
CREATININE: 1.04 mg/dL — AB (ref 0.44–1.00)
Chloride: 101 mmol/L (ref 101–111)
GFR calc non Af Amer: 51 mL/min — ABNORMAL LOW (ref >60)
GFR, EST AFRICAN AMERICAN: 59 mL/min — AB (ref >60)
GLUCOSE: 389 mg/dL — AB (ref 65–99)
Potassium: 4.2 mmol/L (ref 3.5–5.1)
SODIUM: 134 mmol/L — AB (ref 135–145)
Total Bilirubin: 0.7 mg/dL (ref 0.3–1.2)
Total Protein: 6.5 g/dL (ref 6.5–8.1)

## 2017-08-09 NOTE — Patient Instructions (Signed)
[facility] at [facility] Discharge Instructions  RECOMMENDATIONS MADE BY THE CONSULTANT AND ANY TEST RESULTS WILL BE SENT TO YOUR REFERRING PHYSICIAN.  You were seen today by Dr. Louise Zhou    Thank you for choosing [facility] at [facility] to provide your oncology and hematology care.  To afford each patient quality time with our provider, please arrive at least 15 minutes before your scheduled appointment time.    If you have a lab appointment with the Cancer Center please come in thru the  Main Entrance and check in at the main information desk  You need to re-schedule your appointment should you arrive 10 or more minutes late.  We strive to give you quality time with our providers, and arriving late affects you and other patients whose appointments are after yours.  Also, if you no show three or more times for appointments you may be dismissed from the clinic at the providers discretion.     Again, thank you for choosing [facility].  Our hope is that these requests will decrease the amount of time that you wait before being seen by our physicians.       _____________________________________________________________  Should you have questions after your visit to [facility], please contact our office at [phone] between the hours of 8:30 a.m. and 4:30 p.m.  Voicemails left after 4:30 p.m. will not be returned until the following business day.  For prescription refill requests, have your pharmacy contact our office.       Resources For Cancer Patients and their Caregivers ? American Cancer Society: Can assist with transportation, wigs, general needs, runs Look Good Feel Better.        1-888-227-6333 ? Cancer Care: Provides financial assistance, online support groups, medication/co-pay assistance.  1-800-813-HOPE (4673) ? [facility] Assists [place] cancer patients and their  families through emotional , educational and financial support.  336-427-4357 ? [place] DSS Where to apply for food stamps, Medicaid and utility assistance. 336-342-1394 ? [facility]: Transportation to medical appointments. 336-347-2287 ? Social Security Administration: May apply for disability if have a Stage IV cancer. 336-342-7796 1-800-772-1213 ? [place] Aging, Disability and Transit Services: Assists with nutrition, care and transit needs. 336-349-2343  Cancer Center Support Programs: @10RELATIVEDAYS@ > Cancer Support Group  2nd Tuesday of the month 1pm-2pm, Journey Room  > Creative Journey  3rd Tuesday of the month 1130am-1pm, Journey Room  > Look Good Feel Better  1st Wednesday of the month 10am-12 noon, Journey Room (Call American Cancer Society to register 1-800-395-5775)    

## 2017-08-09 NOTE — Progress Notes (Signed)
[place] at [facility] Progress Note  Patient Care Team: Lajean Manes, MD as PCP - General  CHIEF COMPLAINTS:  Invasive ductal carcinoma triple positive  ER+, PR+, HER 2 neu +  RIGHT breast cancer, upper inner quadrant Screening mammogram on 09/04/2015 with possible R breast mass 2 densities noted in the R breast upper inner quadrant within a centimeter of each other (9 and 10 cm from the nipple) 11/03/2015 partial mastectomy, sentinel node biopsy and port placement with Dr. Brantley Stage Final pathology [id number] ER+PR+ Her 2 neu+ R breast carcinoma    Breast cancer of upper-inner quadrant of right female breast ([place])   09/01/2015 Mammogram    BI-RADS CATEGORY 0: Incomplete. Need additional imaging evaluation and/or prior mammograms for comparison.      09/15/2015 Imaging    CT chest- Right lower lobe 4 mm solid pulmonary nodule. If the patient is at high risk for bronchogenic carcinoma, follow-up chest CT at 1 year is recommended.      09/16/2015 Breast US    US showing a small hypoechoic irregular mass with hyperechoic rim in the 1 o'clock location of R breast 10 cm from the nipple. Mass measures 0.7 x 0.5 x 0.6 cm. 2nd mass is identified in the 1 o'clock location 9 cm from the nipple 0.7 x 0.7 x 0.6 cm      09/16/2015 Mammogram    Two suspicious masses in the 1 o'clock location of the right breast warranting tissue diagnosis.  No evidence for adenopathy.      09/21/2015 Pathology Results    Estrogen Receptor: 100%, POSITIVE, STRONG STAINING INTENSITY Progesterone Receptor: 10%, POSITIVE, MODERATE STAINING INTENSITY Proliferation Marker Ki67: 10%.  HER2 - **POSITIVE**      09/21/2015 Mammogram    Appropriate positioning of the 2 biopsy marking clips in the upper-inner quadrant of the right breast at 1 o'clock.      09/21/2015 Initial Biopsy    Breast, right, needle core biopsy, 1:00 o'clock, 9 CMFN - INVASIVE DUCTAL CARCINOMA. - DUCTAL CARCINOMA IN SITU. - SEE  COMMENT. 2. Breast, right, needle core biopsy, 1:00 o'clock, 10 CMFN - INVASIVE DUCTAL CARCINOMA. - DUCTAL CARCINOMA IN SITU WITH       10/26/2015 Procedure    Right chest porta cath placed with no adverse features.- Dr. Brantley Stage.      11/03/2015 Pathology Results    MULTIFOCAL INVASIVE DUCTAL CARCINOMA.  INVASIVE TUMOR IS 1.0 MM FROM NEAREST MARGIN (INFERIOR).  HIGH GRADE DUCTAL CARCINOMA IN SITU WITH NECROSIS.  IN SITU CARCINOMA IS 0.1 MM FROM THE NEAREST MARGIN (INFERIOR MARGIN).      11/03/2015 Procedure    Right lumpectomy by Dr. Brantley Stage.      11/17/2015 Initial Diagnosis    Breast cancer of upper-inner quadrant of right female breast ([place])      11/21/2015 Imaging    Normal left ventricular ejection fraction equal to 71.9%.      11/26/2015 -  Chemotherapy    Paclitaxel weekly x 12       11/26/2015 - 11/15/2016 Antibody Plan    Herceptin weekly with Paclitaxel.      02/15/2016 Echocardiogram    MUGA- Normal LEFT ventricular ejection fraction 65% slightly decreased from 72% on the previous exam.       04/18/2016 Imaging    MUGA- Normal left ventricular ejection fraction equal to 70.1%. This has increased from 65.2% previously.       07/26/2016 Echocardiogram    MUGA Left ventricular ejection fraction  equals 73 %      10/27/2016 Imaging    MUGA- Normal LEFT ventricular ejection fraction of 68%, minimally decreased in a 73% on the previous exam.  Normal LEFT ventricular wall motion.      11/15/2016 -  Adjuvant Chemotherapy    Completed 52 weeks of Herceptin.      11/29/2016 Breast US    Ultrasound-guided biopsy of the mass in the right breast at the 11:30 position 3 cm from nipple performed and surgical path demonstrated fibrosis consistent with scar, no malignancy.      03/22/2017 Procedure    Chemoport removal by Dr. Arnoldo Morale       HISTORY OF PRESENTING ILLNESS:  Lori Greene 76 y.o. female is here for follow-up of  R breast cancer, disease is ER+, PR+,  HER 2 neu  positive. She has completed weekly taxol/herceptin. She has completed XRT.  Patient presents today for follow up. She states her bronchiecstasis has been acting up. She is on the weekly low dose decadron. She states her SOB has not gotten worse but it has not improved either. She states she has a cough as well. No fevers/chills. She is tolerating Arimidex well other than some occasional hot flashes. She has not felt any new breast masses at home. She does feel tired.  Denies joint pain, leg swelling, or any other concerns.   MEDICAL HISTORY:  Past Medical History:  Diagnosis Date  . Asthma   . Breast cancer ([place])    Right breast  . Cancer ([place]) 2016   right breast  . Diabetes mellitus without complication ([place])   . GERD (gastroesophageal reflux disease)   . Hypertension   . Radiation 03/23/16-05/09/16   45 Gy to right breast, boosted to 16 Gy    SURGICAL HISTORY: Past Surgical History:  Procedure Laterality Date  . ABDOMINAL HYSTERECTOMY  1983  . BREAST LUMPECTOMY WITH RADIOACTIVE SEED AND SENTINEL LYMPH NODE BIOPSY Right 11/03/2015   Procedure: RIGHT BREAST LUMPECTOMY WITH RADIOACTIVE SEED AND SENTINEL LYMPH NODE MAPPING;  Surgeon: Erroll Luna, MD;  Location: [place];  Service: General;  Laterality: Right;  . NASAL FRACTURE SURGERY    . PORT-A-CATH REMOVAL Left 03/22/2017   Procedure: MINOR REMOVAL PORT-A-CATH;  Surgeon: Aviva Signs, MD;  Location: [facility];  Service: General;  Laterality: Left;  . PORTACATH PLACEMENT Right 11/03/2015   Procedure: INSERTION PORT-A-CATH;  Surgeon: Erroll Luna, MD;  Location: [place];  Service: General;  Laterality: Right;    SOCIAL HISTORY: Social History   Social History  . Marital status: Married    Spouse name: N/A  . Number of children: N/A  . Years of education: N/A   Occupational History  . retired    Social History Main Topics  . Smoking status: Never Smoker  . Smokeless tobacco: Never Used    . Alcohol use No  . Drug use: No  . Sexual activity: No   Other Topics Concern  . Not on file   Social History Narrative  . No narrative on file  Married 51 years. 0 children. Fostered children. 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 She likes to sew, knit, and quilt. She likes to walk but does not do it often. Non smoker ETOH, none She is from [facility] and moved to [place] when she got married.  FAMILY HISTORY: Family History  Problem Relation Age of Onset  . Breast cancer Mother   . Leukemia Father  indicated that the status of her mother is unknown. She indicated that the status of her father is unknown.   Father died at 54 yo of leukemia Mother died of old age; breast cancer diagnosis at 75 yo. She had a biopsy, no treatment. 1 sister, healthy. No breast cancer. Sister with stage IV colon cancer  ALLERGIES:  is allergic to lisinopril; losartan; and penicillins.  MEDICATIONS:  Current Outpatient Prescriptions  Medication Sig Dispense Refill  . albuterol (PROAIR HFA) 108 (90 BASE) MCG/ACT inhaler Inhale 2 puffs into the lungs every 6 (six) hours as needed for wheezing or shortness of breath.    Marland Kitchen alendronate (FOSAMAX) 70 MG tablet TAKE 1 TABLET BY MOUTH ONCE A WEEK. TAKE WITH A FULL GLASS OF WATER ON AN EMPTY STOMACH (Patient taking differently: TAKE 1 TABLET BY MOUTH ONCE A WEEK on Mondays. TAKE WITH A FULL GLASS OF WATER ON AN EMPTY STOMACH) 4 tablet 2  . amLODipine (NORVASC) 5 MG tablet Take 5 mg by mouth daily.  9  . anastrozole (ARIMIDEX) 1 MG tablet TAKE 1 TABLET (1 MG TOTAL) BY MOUTH DAILY. 90 tablet 1  . atorvastatin (LIPITOR) 10 MG tablet Take 10 mg by mouth daily.    . Calcium-Vitamin D-Vitamin K (VIACTIV) 706-237-62 MG-UNT-MCG CHEW Chew 1 each by mouth daily.    . Cholecalciferol (VITAMIN D3) 2000 units TABS Take 1 tablet by mouth daily.     Marland Kitchen dexamethasone (DECADRON) 4 MG tablet TAKE 2 TABLETS WEEKLY  12  . metFORMIN  (GLUCOPHAGE-XR) 500 MG 24 hr tablet TAKE 2 TABLETS BY MOUTH ONCE A DAY WITH A MEAL  0  . omeprazole (PRILOSEC) 40 MG capsule Take 40 mg by mouth daily.    . SYMBICORT 160-4.5 MCG/ACT inhaler Inhale 2 puffs into the lungs 2 (two) times daily.  12   No current facility-administered medications for this visit.     Review of Systems  Constitutional:       Hot flashes  HENT: Negative for ear pain and hearing loss.   Eyes: Negative.   Respiratory: Positive for cough and shortness of breath.   Cardiovascular: Negative.  Negative for leg swelling.  Gastrointestinal: Negative.   Genitourinary: Negative.   Musculoskeletal: Negative.  Negative for joint pain.  Skin: Negative.   Neurological: Negative.   Endo/Heme/Allergies: Negative.   Psychiatric/Behavioral: Negative.   All other systems reviewed and are negative. 14 point ROS was done and is otherwise as detailed above or in HPI  PHYSICAL EXAMINATION: ECOG PERFORMANCE STATUS: 0 - Asymptomatic   BP (!) 147/67 (BP Location: Left Arm, Patient Position: Sitting)   Pulse 92   Temp 98.4 F (36.9 C) (Oral)   Resp 18   Wt 119 lb 12.8 oz (54.3 kg)   SpO2 98%   BMI 23.40 kg/m   Physical Exam  Constitutional: She is oriented to person, place, and time and well-developed, well-nourished, and in no distress.  HENT:  Head: Normocephalic and atraumatic.  Nose: Nose normal.  Mouth/Throat: Oropharynx is clear and moist. No oropharyngeal exudate.  Eyes: Pupils are equal, round, and reactive to light. Conjunctivae and EOM are normal. Right eye exhibits no discharge. Left eye exhibits no discharge. No scleral icterus.  Neck: Normal range of motion. Neck supple. No tracheal deviation present. No thyromegaly present.  Cardiovascular: Normal rate, regular rhythm and normal heart sounds.  Exam reveals no gallop and no friction rub.   No murmur heard. Pulmonary/Chest: Effort normal. She has no wheezes. She has no rales.  Right breast exhibits no inverted  nipple, no mass and no nipple discharge. Left breast exhibits no inverted nipple, no mass and no nipple discharge.    Rhonchi in left lung fields.  Abdominal: Soft. Bowel sounds are normal. She exhibits no distension and no mass. There is no tenderness. There is no rebound and no guarding.  Musculoskeletal: Normal range of motion. She exhibits no edema.  Lymphadenopathy:    She has no cervical adenopathy.  Neurological: She is alert and oriented to person, place, and time. She has normal reflexes. No cranial nerve deficit. Gait normal. Coordination normal.  Skin: Skin is warm and dry. No rash noted.  Psychiatric: Mood, memory, affect and judgment normal.  Nursing note and vitals reviewed.  LABORATORY DATA:  I have reviewed the data as listed Lab Results  Component Value Date   WBC 10.8 (H) 08/09/2017   HGB 12.2 08/09/2017   HCT 37.4 08/09/2017   MCV 94.0 08/09/2017   PLT 232 08/09/2017   CMP     Component Value Date/Time   NA 134 (L) 08/09/2017 1303   K 4.2 08/09/2017 1303   CL 101 08/09/2017 1303   CO2 20 (L) 08/09/2017 1303   GLUCOSE 389 (H) 08/09/2017 1303   BUN 10 08/09/2017 1303   CREATININE 1.04 (H) 08/09/2017 1303   CALCIUM 8.9 08/09/2017 1303   PROT 6.5 08/09/2017 1303   ALBUMIN 3.8 08/09/2017 1303   AST 26 08/09/2017 1303   ALT 14 08/09/2017 1303   ALKPHOS 67 08/09/2017 1303   BILITOT 0.7 08/09/2017 1303   GFRNONAA 51 (L) 08/09/2017 1303   GFRAA 59 (L) 08/09/2017 1303    RADIOGRAPHIC STUDIES: I have personally reviewed the radiological images as listed and agreed with the findings in the report.  Diagnostic Mammogram 11/22/2016 IMPRESSION: Indeterminate mass in the right breast at 11:30 3 cm from nipple.  PATHOLOGY:   ASSESSMENT & PLAN:  Invasive ductal carcinoma triple positive  ER+ (100%), PR+ (10%), HER 2 neu +  RIGHT breast cancer, upper inner quadrant Screening mammogram on 09/04/2015 with possible R breast mass 2 densities noted in the R breast  upper inner quadrant within a centimeter of each other (9 and 10 cm from the nipple) 11/03/2015 partial mastectomy, sentinel node biopsy and port placement with Dr. Brantley Stage Final pathology [id number] ER+PR+ Her 2 neu+ R breast carcinoma Grade I chemotherapy induced neuropathy feet  Cough, wheezing Hypertension DEXA with osteoporosis on fosamax Aromatase inhibitor related joint pain RLL pulmonary nodule Bronchiectasis  Clinically NED on breast exam today.  She was due for a repeat diagnostic mammogram with [place] in 04/2017, but she said radiology never called her. I have put in orders for it to be done stat.  Continue arimidex, calcium-vitamin D, and fosamax.  Reviewed labs with her in detail and gave her a copy.  She will return for follow up in 4 months with CBC, CMP.   Orders Placed This Encounter  Procedures  . MM DIAG BREAST TOMO UNI RIGHT    Standing Status:   Future    Standing Expiration Date:   08/09/2018    Order Specific Question:   Reason for Exam (SYMPTOM  OR DIAGNOSIS REQUIRED)    Answer:   follow up exam    Order Specific Question:   Preferred imaging location?    Answer:   [facility]  . US Breast Limited Uni Right Inc [place]    Standing Status:   Future    Standing Expiration Date:  10/09/2018    Order Specific Question:   Reason for Exam (SYMPTOM  OR DIAGNOSIS REQUIRED)    Answer:   follow up exam    Order Specific Question:   Preferred imaging location?    Answer:   [facility]  . CBC with Differential    Standing Status:   Future    Standing Expiration Date:   08/09/2018  . Comprehensive metabolic panel    Standing Status:   Future    Standing Expiration Date:   08/09/2018    No orders of the defined types were placed in this encounter.  All questions were answered. The patient knows to call the clinic with any problems, questions or concerns.   This note was electronically signed.   Twana First, MD  08/09/2017 2:23 PM

## 2017-08-24 ENCOUNTER — Other Ambulatory Visit (HOSPITAL_COMMUNITY): Payer: Self-pay | Admitting: Oncology

## 2017-08-24 DIAGNOSIS — C50211 Malignant neoplasm of upper-inner quadrant of right female breast: Secondary | ICD-10-CM

## 2017-08-29 ENCOUNTER — Encounter (HOSPITAL_COMMUNITY): Payer: Medicare Other

## 2017-09-05 DIAGNOSIS — E1121 Type 2 diabetes mellitus with diabetic nephropathy: Secondary | ICD-10-CM | POA: Diagnosis not present

## 2017-09-05 DIAGNOSIS — N183 Chronic kidney disease, stage 3 (moderate): Secondary | ICD-10-CM | POA: Diagnosis not present

## 2017-09-05 DIAGNOSIS — Z1389 Encounter for screening for other disorder: Secondary | ICD-10-CM | POA: Diagnosis not present

## 2017-09-05 DIAGNOSIS — Z135 Encounter for screening for eye and ear disorders: Secondary | ICD-10-CM | POA: Diagnosis not present

## 2017-09-05 DIAGNOSIS — Z79899 Other long term (current) drug therapy: Secondary | ICD-10-CM | POA: Diagnosis not present

## 2017-09-05 DIAGNOSIS — Z7984 Long term (current) use of oral hypoglycemic drugs: Secondary | ICD-10-CM | POA: Diagnosis not present

## 2017-09-05 DIAGNOSIS — E7800 Pure hypercholesterolemia, unspecified: Secondary | ICD-10-CM | POA: Diagnosis not present

## 2017-09-05 DIAGNOSIS — R05 Cough: Secondary | ICD-10-CM | POA: Diagnosis not present

## 2017-09-05 DIAGNOSIS — Z0000 Encounter for general adult medical examination without abnormal findings: Secondary | ICD-10-CM | POA: Diagnosis not present

## 2017-09-05 DIAGNOSIS — I129 Hypertensive chronic kidney disease with stage 1 through stage 4 chronic kidney disease, or unspecified chronic kidney disease: Secondary | ICD-10-CM | POA: Diagnosis not present

## 2017-09-12 ENCOUNTER — Encounter (HOSPITAL_COMMUNITY): Payer: Medicare Other

## 2017-09-12 ENCOUNTER — Inpatient Hospital Stay (HOSPITAL_COMMUNITY): Admission: RE | Admit: 2017-09-12 | Payer: Medicare Other | Source: Ambulatory Visit

## 2017-09-12 ENCOUNTER — Ambulatory Visit (HOSPITAL_COMMUNITY)
Admission: RE | Admit: 2017-09-12 | Discharge: 2017-09-12 | Disposition: A | Discharge status: Home / Self Care | Payer: Medicare Other | Source: Ambulatory Visit | Attending: Oncology | Admitting: Oncology

## 2017-09-12 DIAGNOSIS — C50211 Malignant neoplasm of upper-inner quadrant of right female breast: Secondary | ICD-10-CM

## 2017-09-12 DIAGNOSIS — Z170 Estrogen receptor positive status [ER+]: Secondary | ICD-10-CM | POA: Insufficient documentation

## 2017-09-12 DIAGNOSIS — R922 Inconclusive mammogram: Secondary | ICD-10-CM | POA: Diagnosis not present

## 2017-09-13 DIAGNOSIS — E119 Type 2 diabetes mellitus without complications: Secondary | ICD-10-CM | POA: Diagnosis not present

## 2017-11-03 ENCOUNTER — Other Ambulatory Visit (HOSPITAL_COMMUNITY): Payer: Self-pay | Admitting: *Deleted

## 2017-11-03 DIAGNOSIS — Z170 Estrogen receptor positive status [ER+]: Principal | ICD-10-CM

## 2017-11-03 DIAGNOSIS — C50211 Malignant neoplasm of upper-inner quadrant of right female breast: Secondary | ICD-10-CM

## 2017-11-07 MED ORDER — ALENDRONATE SODIUM 70 MG PO TABS: ORAL_TABLET | ORAL | 2 refills | Status: DC

## 2017-11-08 ENCOUNTER — Other Ambulatory Visit (HOSPITAL_COMMUNITY): Payer: Self-pay | Admitting: Emergency Medicine

## 2017-11-08 DIAGNOSIS — C50211 Malignant neoplasm of upper-inner quadrant of right female breast: Secondary | ICD-10-CM

## 2017-11-08 DIAGNOSIS — Z170 Estrogen receptor positive status [ER+]: Principal | ICD-10-CM

## 2017-11-08 MED ORDER — ALENDRONATE SODIUM 70 MG PO TABS: ORAL_TABLET | ORAL | 1 refills | Status: DC

## 2017-11-08 NOTE — Progress Notes (Signed)
Fosamax sent in for a 90 day supply, 1 refill, to [facility] in [place]

## 2017-11-28 DIAGNOSIS — I129 Hypertensive chronic kidney disease with stage 1 through stage 4 chronic kidney disease, or unspecified chronic kidney disease: Secondary | ICD-10-CM | POA: Diagnosis not present

## 2017-11-28 DIAGNOSIS — N183 Chronic kidney disease, stage 3 (moderate): Secondary | ICD-10-CM | POA: Diagnosis not present

## 2017-11-28 DIAGNOSIS — Z7984 Long term (current) use of oral hypoglycemic drugs: Secondary | ICD-10-CM | POA: Diagnosis not present

## 2017-11-28 DIAGNOSIS — E1121 Type 2 diabetes mellitus with diabetic nephropathy: Secondary | ICD-10-CM | POA: Diagnosis not present

## 2017-11-28 DIAGNOSIS — R05 Cough: Secondary | ICD-10-CM | POA: Diagnosis not present

## 2017-11-30 ENCOUNTER — Other Ambulatory Visit (HOSPITAL_COMMUNITY): Payer: Self-pay | Admitting: *Deleted

## 2017-12-04 ENCOUNTER — Inpatient Hospital Stay (HOSPITAL_COMMUNITY): Payer: Medicare Other | Attending: Oncology

## 2017-12-04 ENCOUNTER — Ambulatory Visit (HOSPITAL_COMMUNITY)
Admission: RE | Admit: 2017-12-04 | Discharge: 2017-12-04 | Disposition: A | Discharge status: Home / Self Care | Payer: Medicare Other | Source: Ambulatory Visit | Attending: Adult Health | Admitting: Adult Health

## 2017-12-04 DIAGNOSIS — K449 Diaphragmatic hernia without obstruction or gangrene: Secondary | ICD-10-CM | POA: Diagnosis not present

## 2017-12-04 DIAGNOSIS — N6031 Fibrosclerosis of right breast: Secondary | ICD-10-CM | POA: Diagnosis not present

## 2017-12-04 DIAGNOSIS — I700 Atherosclerosis of aorta: Secondary | ICD-10-CM | POA: Diagnosis not present

## 2017-12-04 DIAGNOSIS — Z170 Estrogen receptor positive status [ER+]: Secondary | ICD-10-CM

## 2017-12-04 DIAGNOSIS — M810 Age-related osteoporosis without current pathological fracture: Secondary | ICD-10-CM | POA: Insufficient documentation

## 2017-12-04 DIAGNOSIS — E118 Type 2 diabetes mellitus with unspecified complications: Secondary | ICD-10-CM | POA: Insufficient documentation

## 2017-12-04 DIAGNOSIS — C50211 Malignant neoplasm of upper-inner quadrant of right female breast: Secondary | ICD-10-CM

## 2017-12-04 DIAGNOSIS — R911 Solitary pulmonary nodule: Secondary | ICD-10-CM | POA: Diagnosis not present

## 2017-12-04 DIAGNOSIS — R918 Other nonspecific abnormal finding of lung field: Secondary | ICD-10-CM | POA: Diagnosis not present

## 2017-12-04 LAB — COMPREHENSIVE METABOLIC PANEL
ALBUMIN: 3.5 g/dL (ref 3.5–5.0)
ALT: 11 U/L — AB (ref 14–54)
AST: 18 U/L (ref 15–41)
Alkaline Phosphatase: 73 U/L (ref 38–126)
Anion gap: 9 (ref 5–15)
BUN: 14 mg/dL (ref 6–20)
CO2: 27 mmol/L (ref 22–32)
CREATININE: 1.02 mg/dL — AB (ref 0.44–1.00)
Calcium: 8.8 mg/dL — ABNORMAL LOW (ref 8.9–10.3)
Chloride: 99 mmol/L — ABNORMAL LOW (ref 101–111)
GFR calc Af Amer: >60 mL/min (ref >60)
GFR calc non Af Amer: 52 mL/min — ABNORMAL LOW (ref >60)
Glucose, Bld: 204 mg/dL — ABNORMAL HIGH (ref 65–99)
Potassium: 3.8 mmol/L (ref 3.5–5.1)
SODIUM: 135 mmol/L (ref 135–145)
Total Bilirubin: 0.6 mg/dL (ref 0.3–1.2)
Total Protein: 6.3 g/dL — ABNORMAL LOW (ref 6.5–8.1)

## 2017-12-04 LAB — CBC WITH DIFFERENTIAL/PLATELET
Basophils Absolute: 0.1 10*3/uL (ref 0.0–0.1)
Basophils Relative: 1 %
EOS ABS: 0.2 10*3/uL (ref 0.0–0.7)
Eosinophils Relative: 2 %
HCT: 36.6 % (ref 36.0–46.0)
Hemoglobin: 11.6 g/dL — ABNORMAL LOW (ref 12.0–15.0)
Lymphocytes Relative: 32 %
Lymphs Abs: 3.9 10*3/uL (ref 0.7–4.0)
MCH: 30.1 pg (ref 26.0–34.0)
MCHC: 31.7 g/dL (ref 30.0–36.0)
MCV: 94.8 fL (ref 78.0–100.0)
MONO ABS: 0.9 10*3/uL (ref 0.1–1.0)
Monocytes Relative: 7 %
NEUTROS PCT: 58 %
Neutro Abs: 7.2 10*3/uL (ref 1.7–7.7)
PLATELETS: 289 10*3/uL (ref 150–400)
RBC: 3.86 MIL/uL — AB (ref 3.87–5.11)
RDW: 12.4 % (ref 11.5–15.5)
WBC: 12.3 10*3/uL — AB (ref 4.0–10.5)

## 2017-12-05 ENCOUNTER — Telehealth: Payer: Self-pay | Admitting: Internal Medicine

## 2017-12-05 NOTE — Telephone Encounter (Signed)
Notes recorded by Melvenia Needles, NP on 12/05/2017 at 8:15 AM EST RLL nodule dating back to 2016 on CT chest -very small at 67mm , stable without change in Never smoker . -c/w benign etiology .  Stable bronchial thickening .    Hx of breast cancer s/p lumpectomy /partial mastectomy -no evidence of metastatic dz.   Plaque buildup in arteries is noted -discuss with PCP if further testing is needed .   Has not seen Dr. Chase Caller in >2 yr . Needs ov for follow up . Can discuss further on return   ATC pt, no answer. Left message for pt to call back.

## 2017-12-06 ENCOUNTER — Other Ambulatory Visit (HOSPITAL_COMMUNITY): Payer: Medicare Other

## 2017-12-06 NOTE — Telephone Encounter (Signed)
Spoke with patient. She is aware of results. She stated that she is already on statin medication, atorvastatin. Offered appt with MR but patient declined. She stated that she has not been bothered with the coughing in a long time and will call back if she feels an appt is necessary.   Nothing else needed at time of call.

## 2017-12-13 ENCOUNTER — Inpatient Hospital Stay (HOSPITAL_BASED_OUTPATIENT_CLINIC_OR_DEPARTMENT_OTHER): Payer: Medicare Other | Admitting: Internal Medicine

## 2017-12-13 ENCOUNTER — Inpatient Hospital Stay (HOSPITAL_COMMUNITY): Payer: Medicare Other

## 2017-12-13 ENCOUNTER — Encounter (HOSPITAL_COMMUNITY): Payer: Self-pay | Admitting: Internal Medicine

## 2017-12-13 VITALS — BP 133/67 | HR 76 | Temp 98.1°F | Resp 18 | Wt 112.6 lb

## 2017-12-13 DIAGNOSIS — E118 Type 2 diabetes mellitus with unspecified complications: Secondary | ICD-10-CM

## 2017-12-13 DIAGNOSIS — N6031 Fibrosclerosis of right breast: Secondary | ICD-10-CM | POA: Diagnosis not present

## 2017-12-13 DIAGNOSIS — M810 Age-related osteoporosis without current pathological fracture: Secondary | ICD-10-CM | POA: Diagnosis not present

## 2017-12-13 DIAGNOSIS — Z170 Estrogen receptor positive status [ER+]: Principal | ICD-10-CM

## 2017-12-13 DIAGNOSIS — C50211 Malignant neoplasm of upper-inner quadrant of right female breast: Secondary | ICD-10-CM

## 2017-12-13 MED ORDER — DENOSUMAB 60 MG/ML ~~LOC~~ SOLN
60.0000 mg | Freq: Once | SUBCUTANEOUS | Status: AC
  Administered 2017-12-13: 60 mg via SUBCUTANEOUS
  Filled 2017-12-13: qty 1

## 2017-12-13 MED ORDER — TAMOXIFEN CITRATE 20 MG PO TABS: 20.0000 mg | ORAL_TABLET | Freq: Every day | ORAL | 3 refills | Status: DC

## 2017-12-13 NOTE — Patient Instructions (Signed)
[place] at [facility]  Discharge Instructions:  You saw Dr. Bangladesh today.  _______________________________________________________________  Thank you for choosing [place] at [facility] to provide your oncology and hematology care.  To afford each patient quality time with our providers, please arrive at least 15 minutes before your scheduled appointment.  You need to re-schedule your appointment if you arrive 10 or more minutes late.  We strive to give you quality time with our providers, and arriving late affects you and other patients whose appointments are after yours.  Also, if you no show three or more times for appointments you may be dismissed from the clinic.  Again, thank you for choosing [place] at [place] hope is that these requests will allow you access to exceptional care and in a timely manner. _______________________________________________________________  If you have questions after your visit, please contact our office at [phone] between the hours of 8:30 a.m. and 5:00 p.m. Voicemails left after 4:30 p.m. will not be returned until the following business day. _______________________________________________________________  For prescription refill requests, have your pharmacy contact our office. _______________________________________________________________  Recommendations made by the consultant and any test results will be sent to your referring physician. _______________________________________________________________

## 2017-12-13 NOTE — Progress Notes (Addendum)
Noncontrast CT chest from 12/04/2017 showed stable appearing 4 mm right lower lobe pulmonary nodule favoring benign etiology. Chronic bronchial wall thickening. No metastatic disease. Her last mammogram of the right breast was October 2018 which showed no concerning findings patient would require bilateral diagnostic mammography of both breasts in January 2019 Her Herceptin ended November 15, 2016. Chemotherapy ended March 2017 Her last bilateral diagnostic mammogram was reported November 22, 2016 when the right breast abnormality was noted which was subsequently biopsied and proven to be benign.  At that time there was no comment made about the left breast but I am presuming that it was normal. Her last bone density measurement was in August 2017 that showed osteoporosis.  Severe osteoporosis with T score at -4.5  CMP 121 819 shows a serum creatinine of 1.02 stable otherwise unremarkable CBC shows a white cell count of 12.3 hemoglobin 11.6 that is down from 12.2 from September platelets are normal at 289. Differential of the white blood cell count suggested atypical lymphocytes.  Will request a peripheral smear review by pathology.  Right breast core needle biopsy from November 30, 2016 showed fibrosis consistent with scar without evidence of malignancy. Current medications include alendronate 70 mg once a week and anastrozole 1 mg once a day calcium vitamin D.  She is on metformin for diabetes she is on losartan and amlodipine atorvastatin and omeprazole. she is on dexamethasone 4 mg weekly  High risk for osteoporotic fracture not only because of the already bad T score on the DEXA scan but also because of ongoing high risk medications including Arimidex and more importantly Decadron that she takes 4 mg once a week given by Dr. Luan Pulling for apparent bronchiectasis. Therefore strongly recommended against continuing Arimidex.  She is status post hysterectomy but not oophorectomy but she is ADEM so she is  77 years old and no history of DVTs therefore tamoxifen is a better option.  Even though tamoxifen has a beneficial effect on the bones.  Given the degree of osteoporosis and continued use of steroids I would prefer that she continue bone strengthening therapy.  She does have a history of good although her symptoms are well controlled I would much prefer that she switch from oral Fosamax to injectable Prolia every 6 months.  We discussed the potential side effects from Prolia.  She does not have any active gum disease at this point. Proceed with Prolia today and subcutaneously every 6 months. She is on calcium and vitamin D regularly.  Advised her not to lift heavy weights to prevent fracture of the compression fracture of the spine.  Examination is positive for bilateral soft symmetric submandibular lymph nodes likely related to the patient's complains of postnasal drip..  Check vitamin D level at her next visit.  She will increase her vitamin D dose to 100 mcg once daily. She will also increase the dose of her calcium to 1200 mg a day.

## 2017-12-13 NOTE — Progress Notes (Signed)
Dr. Bangladesh aware of calcium of 8.8 on 12/04/17.  Okay to give Prolia today per MD.  Pt advised per MD to double the dose of calcium and Vit D supplements she is currently taking.   Lori Greene presents today for injection per the provider's orders.  Prolia administration without incident; see MAR for injection details.  Patient tolerated procedure well and without incident.  No questions or complaints noted at this time.  Discharged ambulatory.

## 2018-01-25 ENCOUNTER — Telehealth (HOSPITAL_COMMUNITY): Payer: Self-pay

## 2018-01-25 NOTE — Telephone Encounter (Signed)
Patient called and states she is supposed to have a mammogram in March. Her chart shows she was supposed to have a diagnostic mammogram in January 2019 but I can't find the report. Please advise when patient needs mammogram and what type.

## 2018-01-26 ENCOUNTER — Other Ambulatory Visit (HOSPITAL_COMMUNITY): Payer: Self-pay | Admitting: Adult Health

## 2018-01-26 DIAGNOSIS — C50211 Malignant neoplasm of upper-inner quadrant of right female breast: Secondary | ICD-10-CM

## 2018-01-26 DIAGNOSIS — Z170 Estrogen receptor positive status [ER+]: Principal | ICD-10-CM

## 2018-01-26 NOTE — Telephone Encounter (Signed)
Notified patient that her mammogram was due in January. She realized that was correct and requested it be done at the breast center in [place]. Message sent to scheduling.

## 2018-01-26 NOTE — Telephone Encounter (Signed)
Her last mammogram was in 08/2017; looks like the radiologist recommended bilateral diagnostic mammogram in 11/2017. It doesn't look like that was done.  Orders for mammogram placed if you can help get her scheduled and clarify this with her when you call her back.   Thanks! gwd

## 2018-02-02 ENCOUNTER — Ambulatory Visit
Admission: RE | Admit: 2018-02-02 | Discharge: 2018-02-02 | Disposition: A | Discharge status: Home / Self Care | Payer: Medicare Other | Source: Ambulatory Visit | Attending: Adult Health | Admitting: Adult Health

## 2018-02-02 DIAGNOSIS — Z170 Estrogen receptor positive status [ER+]: Principal | ICD-10-CM

## 2018-02-02 DIAGNOSIS — R928 Other abnormal and inconclusive findings on diagnostic imaging of breast: Secondary | ICD-10-CM | POA: Diagnosis not present

## 2018-02-02 DIAGNOSIS — C50211 Malignant neoplasm of upper-inner quadrant of right female breast: Secondary | ICD-10-CM

## 2018-03-13 DIAGNOSIS — H2513 Age-related nuclear cataract, bilateral: Secondary | ICD-10-CM | POA: Diagnosis not present

## 2018-03-14 ENCOUNTER — Other Ambulatory Visit: Payer: Self-pay

## 2018-03-14 ENCOUNTER — Inpatient Hospital Stay (HOSPITAL_COMMUNITY): Payer: Medicare Other | Attending: Hematology | Admitting: Hematology

## 2018-03-14 ENCOUNTER — Encounter (HOSPITAL_COMMUNITY): Payer: Self-pay | Admitting: Hematology

## 2018-03-14 VITALS — BP 137/87 | HR 68 | Temp 98.2°F | Resp 16 | Wt 111.3 lb

## 2018-03-14 DIAGNOSIS — C50211 Malignant neoplasm of upper-inner quadrant of right female breast: Secondary | ICD-10-CM | POA: Diagnosis not present

## 2018-03-14 DIAGNOSIS — R911 Solitary pulmonary nodule: Secondary | ICD-10-CM | POA: Diagnosis not present

## 2018-03-14 DIAGNOSIS — M810 Age-related osteoporosis without current pathological fracture: Secondary | ICD-10-CM

## 2018-03-14 DIAGNOSIS — C50919 Malignant neoplasm of unspecified site of unspecified female breast: Secondary | ICD-10-CM

## 2018-03-14 DIAGNOSIS — E559 Vitamin D deficiency, unspecified: Secondary | ICD-10-CM | POA: Diagnosis not present

## 2018-03-14 DIAGNOSIS — Z170 Estrogen receptor positive status [ER+]: Secondary | ICD-10-CM | POA: Diagnosis not present

## 2018-03-14 MED ORDER — ANASTROZOLE 1 MG PO TABS: 1.0000 mg | ORAL_TABLET | Freq: Every day | ORAL | 3 refills | Status: DC

## 2018-03-14 NOTE — Assessment & Plan Note (Addendum)
1.  Stage Ia (T1c N0) triple positive right breast cancer: -Status post chemotherapy with AC followed by Taxol finished in March 2017, Herceptin completed on 11/15/2016 - Anastrozole switched to tamoxifen in January 2019 secondary to osteoporosis.  Patient tolerated tamoxifen okay, but self discontinued it secondary to vaginal discharge 2 weeks ago.  Will start her back on anastrozole.  We have called in a prescription for it.  Today examination did not reveal any palpable masses.  Mammogram on 02/02/2018 was BI-RADS Category 2.  2.  Osteoporosis: Patient started on Prolia 60 mg every 6 months in January 2019.  She is taking calcium 3 times a day.  She takes vitamin D3 2 tablets daily.  3.  Lung nodule: CT scan on 12/04/2017 shows 4 mm right lower lobe lung nodule.  This was stable when compared to the scan in December 2017.

## 2018-03-14 NOTE — Progress Notes (Signed)
[place]  Patient Care Team: Lori Manes, MD as PCP - General  DIAGNOSIS:  Encounter Diagnoses  Name Primary?  . Malignant neoplasm of upper-inner quadrant of right breast in female, estrogen receptor positive ([place]) Yes  . HER2-positive carcinoma of breast ([place])   . Vitamin D deficiency     SUMMARY OF ONCOLOGIC HISTORY:   Breast cancer of upper-inner quadrant of right female breast ([place])   09/01/2015 Mammogram    BI-RADS CATEGORY 0: Incomplete. Need additional imaging evaluation and/or prior mammograms for comparison.      09/15/2015 Imaging    CT chest- Right lower lobe 4 mm solid pulmonary nodule. If the patient is at high risk for bronchogenic carcinoma, follow-up chest CT at 1 year is recommended.      09/16/2015 Breast US    US showing a small hypoechoic irregular mass with hyperechoic rim in the 1 o'clock location of R breast 10 cm from the nipple. Mass measures 0.7 x 0.5 x 0.6 cm. 2nd mass is identified in the 1 o'clock location 9 cm from the nipple 0.7 x 0.7 x 0.6 cm      09/16/2015 Mammogram    Two suspicious masses in the 1 o'clock location of the right breast warranting tissue diagnosis.  No evidence for adenopathy.      09/21/2015 Pathology Results    Estrogen Receptor: 100%, POSITIVE, STRONG STAINING INTENSITY Progesterone Receptor: 10%, POSITIVE, MODERATE STAINING INTENSITY Proliferation Marker Ki67: 10%.  HER2 - **POSITIVE**      09/21/2015 Mammogram    Appropriate positioning of the 2 biopsy marking clips in the upper-inner quadrant of the right breast at 1 o'clock.      09/21/2015 Initial Biopsy    Breast, right, needle core biopsy, 1:00 o'clock, 9 CMFN - INVASIVE DUCTAL CARCINOMA. - DUCTAL CARCINOMA IN SITU. - SEE COMMENT. 2. Breast, right, needle core biopsy, 1:00 o'clock, 10 CMFN - INVASIVE DUCTAL CARCINOMA. - DUCTAL CARCINOMA IN SITU WITH       10/26/2015 Procedure    Right chest porta cath placed with no adverse features.- Dr.  Brantley Greene.      11/03/2015 Pathology Results    MULTIFOCAL INVASIVE DUCTAL CARCINOMA.  INVASIVE TUMOR IS 1.0 MM FROM NEAREST MARGIN (INFERIOR).  HIGH GRADE DUCTAL CARCINOMA IN SITU WITH NECROSIS.  IN SITU CARCINOMA IS 0.1 MM FROM THE NEAREST MARGIN (INFERIOR MARGIN).      11/03/2015 Procedure    Right lumpectomy by Dr. Brantley Greene.      11/17/2015 Initial Diagnosis    Breast cancer of upper-inner quadrant of right female breast ([place])      11/21/2015 Imaging    Normal left ventricular ejection fraction equal to 71.9%.      11/26/2015 -  Chemotherapy    Paclitaxel weekly x 12       11/26/2015 - 11/15/2016 Antibody Plan    Herceptin weekly with Paclitaxel.      02/15/2016 Echocardiogram    MUGA- Normal LEFT ventricular ejection fraction 65% slightly decreased from 72% on the previous exam.       04/18/2016 Imaging    MUGA- Normal left ventricular ejection fraction equal to 70.1%. This has increased from 65.2% previously.       07/26/2016 Echocardiogram    MUGA Left ventricular ejection fraction equals 73 %      10/27/2016 Imaging    MUGA- Normal LEFT ventricular ejection fraction of 68%, minimally decreased in a 73% on the previous exam.  Normal LEFT ventricular wall motion.  11/15/2016 -  Adjuvant Chemotherapy    Completed 52 weeks of Herceptin.      11/29/2016 Breast US    Ultrasound-guided biopsy of the mass in the right breast at the 11:30 position 3 cm from nipple performed and surgical path demonstrated fibrosis consistent with scar, no malignancy.      03/22/2017 Procedure    Chemoport removal by Dr. 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 female here for routine follow-up for h/o right breast cancer.   Arimidex was stopped at her last follow-up visit with Dr. Sherrine [facility] ([facility]).  She was started on Tamoxifen at that time.  Reports that she stopped the  Tamoxifen on her own a few weeks ago because she had "vaginal discharge."  In the past, she tolerated Arimidex well.   She was started on Prolia on 12/13/17; Fosamax was stopped at that time per Dr. Corliss Greene documentation. Lori Greene is taking calcium and vitamin D supplementation.   Last mammogram was on 02/02/18 and was negative.  Denies any changes to her breasts.   Denies any new pain, shortness of breath, or orthopnea.  She has an occasional cough Remains on Decadron 8 mg po weekly for bronchiectasis (managed by Dr. Luan Greene).   The steroids is the likely cause for her leukocytosis.   [place]. She is the primary caregiver for her husband, who has ailing health and has no other family support here.  [place], but does not have plans to do so right now.      REVIEW OF SYSTEMS:   Constitutional: Denies fevers, chills or abnormal weight loss Eyes: Denies blurriness of vision Ears, nose, mouth, throat, and face: Denies mucositis or sore throat Respiratory: Denies cough, dyspnea or wheezes Cardiovascular: Denies palpitation, chest discomfort Gastrointestinal:  Denies nausea, heartburn or change in bowel habits Skin: Denies abnormal skin rashes Lymphatics: Denies new lymphadenopathy or easy bruising Neurological:Denies numbness, tingling or new weaknesses Behavioral/Psych: Mood is stable, no new changes  Extremities: No lower extremity edema Breast:  denies any pain or lumps or nodules in either breasts All other systems were reviewed with the patient and are negative.  I have reviewed the past medical history, past surgical history, social history and family history with the patient and they are unchanged from previous note.  ALLERGIES:  is allergic to lisinopril; losartan; and penicillins.  MEDICATIONS:  Current  Outpatient Medications  Medication Sig Dispense Refill  . albuterol (PROAIR HFA) 108 (90 BASE) MCG/ACT inhaler Inhale 2 puffs into the lungs every 6 (six) hours as needed for wheezing or shortness of breath.    Lori Kitchen amLODipine (NORVASC) 5 MG tablet Take 5 mg by mouth daily.  9  . atorvastatin (LIPITOR) 10 MG tablet Take 10 mg by mouth daily.    . Calcium-Vitamin D-Vitamin K (VIACTIV) [id number] MG-UNT-MCG CHEW Chew 1 each by mouth daily.    . Cholecalciferol (VITAMIN D3) 2000 units TABS Take 1 tablet by mouth daily.     Lori Kitchen dexamethasone (DECADRON) 4 MG tablet TAKE 2 TABLETS WEEKLY  12  . losartan (COZAAR) 25 MG tablet Take 25 mg by mouth daily.    . metFORMIN (GLUCOPHAGE-XR) 500 MG 24 hr tablet TAKE 2  TABLETS BY MOUTH ONCE A DAY WITH A MEAL  0  . omeprazole (PRILOSEC) 40 MG capsule Take 40 mg by mouth daily.    . SYMBICORT 160-4.5 MCG/ACT inhaler Inhale 2 puffs into the lungs 2 (two) times daily.  12   No current facility-administered medications for this visit.     PHYSICAL EXAMINATION: ECOG PERFORMANCE STATUS: 1 - Symptomatic but completely ambulatory I have reviewed her vitals. GENERAL:alert, no distress and comfortable SKIN: skin color, texture, turgor are normal, no rashes or significant lesions EYES: normal, Conjunctiva are pink and non-injected, sclera clear OROPHARYNX:no mucositis, no erythema and lips, buccal mucosa, and tongue normal  NECK: supple, thyroid normal size, non-tender, without nodularity LYMPH:  no palpable lymphadenopathy in the cervical, axillary or inguinal LUNGS: clear to auscultation and percussion with normal breathing effort HEART: regular rate & rhythm and no murmurs and no lower extremity edema ABDOMEN:abdomen soft, non-tender and normal bowel sounds MUSCULOSKELETAL:no cyanosis of digits and no clubbing   EXTREMITIES: No lower extremity edema BREAST: Right breast upper inner quadrant lumpectomy scar is within normal limits.  No palpable mass in bilateral  breast.  No palpable axillary adenopathy.  No palpable supraclavicular adenopathy.  Left breast has no palpable masses.  LABORATORY DATA:  I have reviewed the data as listed CMP Latest Ref Rng & Units 12/04/2017 08/09/2017 10/24/2016  Glucose 65 - 99 mg/dL 204(H) 389(H) 153(H)  BUN 6 - 20 mg/dL '14 10 10  '$ Creatinine 0.44 - 1.00 mg/dL 1.02(H) 1.04(H) 1.06(H)  Sodium 135 - 145 mmol/L 135 134(L) 139  Potassium 3.5 - 5.1 mmol/L 3.8 4.2 3.9  Chloride 101 - 111 mmol/L 99(L) 101 108  CO2 22 - 32 mmol/L 27 20(L) 24  Calcium 8.9 - 10.3 mg/dL 8.8(L) 8.9 8.9  Total Protein 6.5 - 8.1 g/dL 6.3(L) 6.5 6.3(L)  Total Bilirubin 0.3 - 1.2 mg/dL 0.6 0.7 1.0  Alkaline Phos 38 - 126 U/L 73 67 109  AST 15 - 41 U/L '18 26 18  '$ ALT 14 - 54 U/L 11(L) 14 12(L)   No results found for: [id number]   Lab Results  Component Value Date   WBC 12.3 (H) 12/04/2017   HGB 11.6 (L) 12/04/2017   HCT 36.6 12/04/2017   MCV 94.8 12/04/2017   PLT 289 12/04/2017   NEUTROABS 7.2 12/04/2017    ASSESSMENT & PLAN:  Breast cancer of upper-inner quadrant of right female breast ([facility]) 1.  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  Patient tolerated tamoxifen okay, but self discontinued it secondary to vaginal discharge 2 weeks ago.  Will start her back on anastrozole.  We have called in a prescription for it.  Today examination did not reveal any palpable masses.  Mammogram on 02/02/2018 was BI-RADS Category 2.  2.  Osteoporosis: Patient started on Prolia 60 mg every 6 months in January 2019.  She is taking calcium 3 times a day.  She takes vitamin D3 2 tablets daily.  3.  Lung nodule: CT scan on 12/04/2017 shows 4 mm right lower lobe lung nodule.  This was stable when compared to the scan in December 2017.      Breast Cancer therapy  associated bone loss: I have recommended calcium, Vitamin D and weight bearing exercises.    Orders Placed This Encounter  Procedures  . Comprehensive metabolic panel  Standing Status:   Future    Standing Expiration Date:   03/15/2019    Order Specific Question:   Has the patient fasted?    Answer:   No  . CBC with Differential/Platelet    Standing Status:   Future    Standing Expiration Date:   03/15/2019  . VITAMIN D 25 Hydroxy (Vit-D Deficiency, Fractures)    Standing Status:   Future    Standing Expiration Date:   03/15/2019   The patient has a good understanding of the overall plan. she agrees with it. she will call with any problems that may develop before the next visit here.  This note includes documentation from Mike Craze, NP, who was present during this patient's office visit and evaluation.  I have reviewed this note for its completeness and accuracy.  I have edited this note accordingly based on my findings and medical opinion.      Derek Jack, MD 03/14/18

## 2018-03-29 DIAGNOSIS — H25043 Posterior subcapsular polar age-related cataract, bilateral: Secondary | ICD-10-CM | POA: Diagnosis not present

## 2018-03-29 DIAGNOSIS — H04123 Dry eye syndrome of bilateral lacrimal glands: Secondary | ICD-10-CM | POA: Diagnosis not present

## 2018-04-24 DIAGNOSIS — I129 Hypertensive chronic kidney disease with stage 1 through stage 4 chronic kidney disease, or unspecified chronic kidney disease: Secondary | ICD-10-CM | POA: Diagnosis not present

## 2018-04-24 DIAGNOSIS — E113293 Type 2 diabetes mellitus with mild nonproliferative diabetic retinopathy without macular edema, bilateral: Secondary | ICD-10-CM | POA: Diagnosis not present

## 2018-04-24 DIAGNOSIS — N183 Chronic kidney disease, stage 3 (moderate): Secondary | ICD-10-CM | POA: Diagnosis not present

## 2018-04-24 DIAGNOSIS — E1121 Type 2 diabetes mellitus with diabetic nephropathy: Secondary | ICD-10-CM | POA: Diagnosis not present

## 2018-04-24 DIAGNOSIS — J45909 Unspecified asthma, uncomplicated: Secondary | ICD-10-CM | POA: Diagnosis not present

## 2018-04-24 DIAGNOSIS — Z79899 Other long term (current) drug therapy: Secondary | ICD-10-CM | POA: Diagnosis not present

## 2018-04-25 DIAGNOSIS — D649 Anemia, unspecified: Secondary | ICD-10-CM | POA: Diagnosis not present

## 2018-05-03 DIAGNOSIS — H2512 Age-related nuclear cataract, left eye: Secondary | ICD-10-CM | POA: Diagnosis not present

## 2018-05-03 DIAGNOSIS — J449 Chronic obstructive pulmonary disease, unspecified: Secondary | ICD-10-CM | POA: Diagnosis not present

## 2018-05-03 DIAGNOSIS — H25042 Posterior subcapsular polar age-related cataract, left eye: Secondary | ICD-10-CM | POA: Diagnosis not present

## 2018-05-03 DIAGNOSIS — E1122 Type 2 diabetes mellitus with diabetic chronic kidney disease: Secondary | ICD-10-CM | POA: Diagnosis not present

## 2018-05-03 DIAGNOSIS — H52202 Unspecified astigmatism, left eye: Secondary | ICD-10-CM | POA: Diagnosis not present

## 2018-05-03 DIAGNOSIS — I129 Hypertensive chronic kidney disease with stage 1 through stage 4 chronic kidney disease, or unspecified chronic kidney disease: Secondary | ICD-10-CM | POA: Diagnosis not present

## 2018-05-03 DIAGNOSIS — Z79899 Other long term (current) drug therapy: Secondary | ICD-10-CM | POA: Diagnosis not present

## 2018-05-03 DIAGNOSIS — E113293 Type 2 diabetes mellitus with mild nonproliferative diabetic retinopathy without macular edema, bilateral: Secondary | ICD-10-CM | POA: Diagnosis not present

## 2018-05-03 DIAGNOSIS — Z7984 Long term (current) use of oral hypoglycemic drugs: Secondary | ICD-10-CM | POA: Diagnosis not present

## 2018-05-03 DIAGNOSIS — Z888 Allergy status to other drugs, medicaments and biological substances status: Secondary | ICD-10-CM | POA: Diagnosis not present

## 2018-05-03 DIAGNOSIS — Z881 Allergy status to other antibiotic agents status: Secondary | ICD-10-CM | POA: Diagnosis not present

## 2018-05-03 DIAGNOSIS — Z880 Allergy status to penicillin: Secondary | ICD-10-CM | POA: Diagnosis not present

## 2018-05-03 DIAGNOSIS — K219 Gastro-esophageal reflux disease without esophagitis: Secondary | ICD-10-CM | POA: Diagnosis not present

## 2018-05-03 DIAGNOSIS — E1121 Type 2 diabetes mellitus with diabetic nephropathy: Secondary | ICD-10-CM | POA: Diagnosis not present

## 2018-05-03 DIAGNOSIS — N183 Chronic kidney disease, stage 3 (moderate): Secondary | ICD-10-CM | POA: Diagnosis not present

## 2018-05-03 DIAGNOSIS — Z7951 Long term (current) use of inhaled steroids: Secondary | ICD-10-CM | POA: Diagnosis not present

## 2018-05-22 DIAGNOSIS — M7989 Other specified soft tissue disorders: Secondary | ICD-10-CM | POA: Diagnosis not present

## 2018-05-22 DIAGNOSIS — Z7984 Long term (current) use of oral hypoglycemic drugs: Secondary | ICD-10-CM | POA: Diagnosis not present

## 2018-05-22 DIAGNOSIS — Z23 Encounter for immunization: Secondary | ICD-10-CM | POA: Diagnosis not present

## 2018-05-22 DIAGNOSIS — E119 Type 2 diabetes mellitus without complications: Secondary | ICD-10-CM | POA: Diagnosis not present

## 2018-05-22 DIAGNOSIS — G8911 Acute pain due to trauma: Secondary | ICD-10-CM | POA: Diagnosis not present

## 2018-05-22 DIAGNOSIS — S91011A Laceration without foreign body, right ankle, initial encounter: Secondary | ICD-10-CM | POA: Diagnosis not present

## 2018-05-22 DIAGNOSIS — Z79899 Other long term (current) drug therapy: Secondary | ICD-10-CM | POA: Diagnosis not present

## 2018-05-22 DIAGNOSIS — Z880 Allergy status to penicillin: Secondary | ICD-10-CM | POA: Diagnosis not present

## 2018-05-22 DIAGNOSIS — I10 Essential (primary) hypertension: Secondary | ICD-10-CM | POA: Diagnosis not present

## 2018-05-24 DIAGNOSIS — Z7951 Long term (current) use of inhaled steroids: Secondary | ICD-10-CM | POA: Diagnosis not present

## 2018-05-24 DIAGNOSIS — J449 Chronic obstructive pulmonary disease, unspecified: Secondary | ICD-10-CM | POA: Diagnosis not present

## 2018-05-24 DIAGNOSIS — I129 Hypertensive chronic kidney disease with stage 1 through stage 4 chronic kidney disease, or unspecified chronic kidney disease: Secondary | ICD-10-CM | POA: Diagnosis not present

## 2018-05-24 DIAGNOSIS — H2511 Age-related nuclear cataract, right eye: Secondary | ICD-10-CM | POA: Diagnosis not present

## 2018-05-24 DIAGNOSIS — E113293 Type 2 diabetes mellitus with mild nonproliferative diabetic retinopathy without macular edema, bilateral: Secondary | ICD-10-CM | POA: Diagnosis not present

## 2018-05-24 DIAGNOSIS — Z880 Allergy status to penicillin: Secondary | ICD-10-CM | POA: Diagnosis not present

## 2018-05-24 DIAGNOSIS — E1122 Type 2 diabetes mellitus with diabetic chronic kidney disease: Secondary | ICD-10-CM | POA: Diagnosis not present

## 2018-05-24 DIAGNOSIS — Z961 Presence of intraocular lens: Secondary | ICD-10-CM | POA: Diagnosis not present

## 2018-05-24 DIAGNOSIS — H25041 Posterior subcapsular polar age-related cataract, right eye: Secondary | ICD-10-CM | POA: Diagnosis not present

## 2018-05-24 DIAGNOSIS — H4421 Degenerative myopia, right eye: Secondary | ICD-10-CM | POA: Diagnosis not present

## 2018-05-24 DIAGNOSIS — E1136 Type 2 diabetes mellitus with diabetic cataract: Secondary | ICD-10-CM | POA: Diagnosis not present

## 2018-05-24 DIAGNOSIS — N183 Chronic kidney disease, stage 3 (moderate): Secondary | ICD-10-CM | POA: Diagnosis not present

## 2018-05-24 DIAGNOSIS — H5231 Anisometropia: Secondary | ICD-10-CM | POA: Diagnosis not present

## 2018-05-24 DIAGNOSIS — Z79899 Other long term (current) drug therapy: Secondary | ICD-10-CM | POA: Diagnosis not present

## 2018-05-24 DIAGNOSIS — Z888 Allergy status to other drugs, medicaments and biological substances status: Secondary | ICD-10-CM | POA: Diagnosis not present

## 2018-05-24 DIAGNOSIS — Z9842 Cataract extraction status, left eye: Secondary | ICD-10-CM | POA: Diagnosis not present

## 2018-05-24 DIAGNOSIS — Z7984 Long term (current) use of oral hypoglycemic drugs: Secondary | ICD-10-CM | POA: Diagnosis not present

## 2018-05-24 DIAGNOSIS — H52201 Unspecified astigmatism, right eye: Secondary | ICD-10-CM | POA: Diagnosis not present

## 2018-06-01 DIAGNOSIS — Z7984 Long term (current) use of oral hypoglycemic drugs: Secondary | ICD-10-CM | POA: Diagnosis not present

## 2018-06-01 DIAGNOSIS — Z880 Allergy status to penicillin: Secondary | ICD-10-CM | POA: Diagnosis not present

## 2018-06-01 DIAGNOSIS — I10 Essential (primary) hypertension: Secondary | ICD-10-CM | POA: Diagnosis not present

## 2018-06-01 DIAGNOSIS — Z7951 Long term (current) use of inhaled steroids: Secondary | ICD-10-CM | POA: Diagnosis not present

## 2018-06-01 DIAGNOSIS — S91011D Laceration without foreign body, right ankle, subsequent encounter: Secondary | ICD-10-CM | POA: Diagnosis not present

## 2018-06-01 DIAGNOSIS — E119 Type 2 diabetes mellitus without complications: Secondary | ICD-10-CM | POA: Diagnosis not present

## 2018-06-01 DIAGNOSIS — Z79899 Other long term (current) drug therapy: Secondary | ICD-10-CM | POA: Diagnosis not present

## 2018-06-01 DIAGNOSIS — Z4802 Encounter for removal of sutures: Secondary | ICD-10-CM | POA: Diagnosis not present

## 2018-06-13 ENCOUNTER — Inpatient Hospital Stay (HOSPITAL_COMMUNITY): Payer: Medicare Other | Attending: Hematology

## 2018-06-13 ENCOUNTER — Inpatient Hospital Stay (HOSPITAL_COMMUNITY): Payer: Medicare Other

## 2018-06-13 VITALS — BP 146/58 | HR 66 | Temp 98.0°F | Resp 18

## 2018-06-13 DIAGNOSIS — C50211 Malignant neoplasm of upper-inner quadrant of right female breast: Secondary | ICD-10-CM | POA: Insufficient documentation

## 2018-06-13 DIAGNOSIS — M810 Age-related osteoporosis without current pathological fracture: Secondary | ICD-10-CM | POA: Insufficient documentation

## 2018-06-13 DIAGNOSIS — Z170 Estrogen receptor positive status [ER+]: Principal | ICD-10-CM

## 2018-06-13 DIAGNOSIS — Z79899 Other long term (current) drug therapy: Secondary | ICD-10-CM | POA: Diagnosis not present

## 2018-06-13 DIAGNOSIS — N183 Chronic kidney disease, stage 3 (moderate): Secondary | ICD-10-CM | POA: Diagnosis not present

## 2018-06-13 DIAGNOSIS — I129 Hypertensive chronic kidney disease with stage 1 through stage 4 chronic kidney disease, or unspecified chronic kidney disease: Secondary | ICD-10-CM | POA: Diagnosis not present

## 2018-06-13 DIAGNOSIS — S91011A Laceration without foreign body, right ankle, initial encounter: Secondary | ICD-10-CM | POA: Diagnosis not present

## 2018-06-13 LAB — CBC WITH DIFFERENTIAL/PLATELET
BASOS ABS: 0.1 10*3/uL (ref 0.0–0.1)
BASOS PCT: 1 %
Eosinophils Absolute: 0.5 10*3/uL (ref 0.0–0.7)
Eosinophils Relative: 5 %
HEMATOCRIT: 38.3 % (ref 36.0–46.0)
HEMOGLOBIN: 12.2 g/dL (ref 12.0–15.0)
Lymphocytes Relative: 31 %
Lymphs Abs: 2.8 10*3/uL (ref 0.7–4.0)
MCH: 30.6 pg (ref 26.0–34.0)
MCHC: 31.9 g/dL (ref 30.0–36.0)
MCV: 96 fL (ref 78.0–100.0)
MONO ABS: 0.7 10*3/uL (ref 0.1–1.0)
Monocytes Relative: 7 %
NEUTROS ABS: 5.1 10*3/uL (ref 1.7–7.7)
Neutrophils Relative %: 56 %
Platelets: 248 10*3/uL (ref 150–400)
RBC: 3.99 MIL/uL (ref 3.87–5.11)
RDW: 12.9 % (ref 11.5–15.5)
WBC: 9.1 10*3/uL (ref 4.0–10.5)

## 2018-06-13 LAB — COMPREHENSIVE METABOLIC PANEL
ALK PHOS: 55 U/L (ref 38–126)
ALT: 16 U/L (ref 0–44)
AST: 20 U/L (ref 15–41)
Albumin: 3.7 g/dL (ref 3.5–5.0)
Anion gap: 8 (ref 5–15)
BILIRUBIN TOTAL: 1.5 mg/dL — AB (ref 0.3–1.2)
BUN: 11 mg/dL (ref 8–23)
CALCIUM: 9 mg/dL (ref 8.9–10.3)
CO2: 28 mmol/L (ref 22–32)
Chloride: 104 mmol/L (ref 98–111)
Creatinine, Ser: 0.98 mg/dL (ref 0.44–1.00)
GFR calc Af Amer: >60 mL/min (ref >60)
GFR, EST NON AFRICAN AMERICAN: 55 mL/min — AB (ref >60)
Glucose, Bld: 144 mg/dL — ABNORMAL HIGH (ref 70–99)
POTASSIUM: 4.3 mmol/L (ref 3.5–5.1)
Sodium: 140 mmol/L (ref 135–145)
TOTAL PROTEIN: 6.4 g/dL — AB (ref 6.5–8.1)

## 2018-06-13 MED ORDER — DENOSUMAB 60 MG/ML ~~LOC~~ SOSY
60.0000 mg | PREFILLED_SYRINGE | Freq: Once | SUBCUTANEOUS | Status: AC
  Administered 2018-06-13: 60 mg via SUBCUTANEOUS
  Filled 2018-06-13: qty 1

## 2018-06-14 ENCOUNTER — Other Ambulatory Visit: Payer: Self-pay

## 2018-06-14 ENCOUNTER — Encounter (HOSPITAL_COMMUNITY): Payer: Self-pay

## 2018-06-14 NOTE — Progress Notes (Signed)
Patient her for prolia injection today. See MAR.  Consent obtained.   Patient tolerated it well without problems. Vitals stable and discharged home from clinic ambulatory. Follow up as scheduled.

## 2018-06-14 NOTE — Patient Instructions (Signed)
[place] at [facility] Discharge Instructions  prolia given today Follow up as scheduled.   Thank you for choosing [place] [facility] to provide your oncology and hematology care.  To afford each patient quality time with our provider, please arrive at least 15 minutes before your scheduled appointment time.   If you have a lab appointment with the [facility] please come in thru the  Main Entrance and check in at the main information desk  You need to re-schedule your appointment should you arrive 10 or more minutes late.  We strive to give you quality time with our providers, and arriving late affects you and other patients whose appointments are after yours.  Also, if you no show three or more times for appointments you may be dismissed from the clinic at the providers discretion.     Again, thank you for choosing [facility].  Our hope is that these requests will decrease the amount of time that you wait before being seen by our physicians.       _____________________________________________________________  Should you have questions after your visit to [facility], please contact our office at [phone] between the hours of 8:00 a.m. and 4:30 p.m.  Voicemails left after 4:00 p.m. will not be returned until the following business day.  For prescription refill requests, have your pharmacy contact our office and allow 72 hours.    Cancer Center Support Programs:   > Cancer Support Group  2nd Tuesday of the month 1pm-2pm, Journey Room

## 2018-07-10 ENCOUNTER — Inpatient Hospital Stay (HOSPITAL_COMMUNITY): Payer: Medicare Other | Attending: Hematology

## 2018-07-10 DIAGNOSIS — C50211 Malignant neoplasm of upper-inner quadrant of right female breast: Secondary | ICD-10-CM | POA: Insufficient documentation

## 2018-07-10 DIAGNOSIS — Z170 Estrogen receptor positive status [ER+]: Secondary | ICD-10-CM

## 2018-07-10 LAB — CBC WITH DIFFERENTIAL/PLATELET
Basophils Absolute: 0.1 10*3/uL (ref 0.0–0.1)
Basophils Relative: 1 %
EOS ABS: 0.6 10*3/uL (ref 0.0–0.7)
Eosinophils Relative: 6 %
HCT: 37.8 % (ref 36.0–46.0)
Hemoglobin: 12.1 g/dL (ref 12.0–15.0)
LYMPHS ABS: 3.4 10*3/uL (ref 0.7–4.0)
LYMPHS PCT: 36 %
MCH: 30.6 pg (ref 26.0–34.0)
MCHC: 32 g/dL (ref 30.0–36.0)
MCV: 95.5 fL (ref 78.0–100.0)
MONOS PCT: 8 %
Monocytes Absolute: 0.7 10*3/uL (ref 0.1–1.0)
NEUTROS PCT: 49 %
Neutro Abs: 4.8 10*3/uL (ref 1.7–7.7)
Platelets: 222 10*3/uL (ref 150–400)
RBC: 3.96 MIL/uL (ref 3.87–5.11)
RDW: 12.9 % (ref 11.5–15.5)
WBC: 9.6 10*3/uL (ref 4.0–10.5)

## 2018-07-10 LAB — COMPREHENSIVE METABOLIC PANEL
ALT: 14 U/L (ref 0–44)
ANION GAP: 7 (ref 5–15)
AST: 18 U/L (ref 15–41)
Albumin: 3.7 g/dL (ref 3.5–5.0)
Alkaline Phosphatase: 54 U/L (ref 38–126)
BILIRUBIN TOTAL: 1.1 mg/dL (ref 0.3–1.2)
BUN: 13 mg/dL (ref 8–23)
CALCIUM: 9 mg/dL (ref 8.9–10.3)
CO2: 26 mmol/L (ref 22–32)
CREATININE: 1.11 mg/dL — AB (ref 0.44–1.00)
Chloride: 109 mmol/L (ref 98–111)
GFR, EST AFRICAN AMERICAN: 54 mL/min — AB (ref >60)
GFR, EST NON AFRICAN AMERICAN: 47 mL/min — AB (ref >60)
Glucose, Bld: 119 mg/dL — ABNORMAL HIGH (ref 70–99)
Potassium: 4.2 mmol/L (ref 3.5–5.1)
SODIUM: 142 mmol/L (ref 135–145)
TOTAL PROTEIN: 6.4 g/dL — AB (ref 6.5–8.1)

## 2018-07-17 ENCOUNTER — Inpatient Hospital Stay (HOSPITAL_COMMUNITY): Payer: Medicare Other | Attending: Internal Medicine | Admitting: Internal Medicine

## 2018-07-17 ENCOUNTER — Encounter (HOSPITAL_COMMUNITY): Payer: Self-pay | Admitting: Internal Medicine

## 2018-07-17 VITALS — BP 124/66 | HR 74 | Temp 98.2°F | Resp 14 | Wt 113.7 lb

## 2018-07-17 DIAGNOSIS — C50211 Malignant neoplasm of upper-inner quadrant of right female breast: Secondary | ICD-10-CM

## 2018-07-17 DIAGNOSIS — R918 Other nonspecific abnormal finding of lung field: Secondary | ICD-10-CM

## 2018-07-17 DIAGNOSIS — Z79811 Long term (current) use of aromatase inhibitors: Secondary | ICD-10-CM | POA: Insufficient documentation

## 2018-07-17 DIAGNOSIS — M810 Age-related osteoporosis without current pathological fracture: Secondary | ICD-10-CM | POA: Insufficient documentation

## 2018-07-17 DIAGNOSIS — Z170 Estrogen receptor positive status [ER+]: Secondary | ICD-10-CM | POA: Insufficient documentation

## 2018-07-17 DIAGNOSIS — Z9221 Personal history of antineoplastic chemotherapy: Secondary | ICD-10-CM | POA: Insufficient documentation

## 2018-07-17 DIAGNOSIS — I10 Essential (primary) hypertension: Secondary | ICD-10-CM | POA: Diagnosis not present

## 2018-07-17 DIAGNOSIS — R911 Solitary pulmonary nodule: Secondary | ICD-10-CM | POA: Insufficient documentation

## 2018-07-17 NOTE — Progress Notes (Signed)
Diagnosis Malignant neoplasm of upper-inner quadrant of right breast in female, estrogen receptor positive ([place]) - Plan: CBC with Differential/Platelet, Comprehensive metabolic panel, Lactate dehydrogenase, MM DIAG BREAST TOMO BILATERAL, CANCELED: MM Digital Diagnostic Bilat, CANCELED: [place] 3-Dimensional Imaging  Abnormal findings on diagnostic imaging of lung - Plan: CBC with Differential/Platelet, Comprehensive metabolic panel, Lactate dehydrogenase, CT Chest Wo Contrast, CANCELED: MM Digital Diagnostic Bilat  Staging Cancer Staging Breast cancer of upper-inner quadrant of right female breast [facility]) Staging form: Breast, AJCC 7th Edition - Pathologic stage from 12/09/2015: Stage IA (T1c, N0, cM0) - Signed by Baird Cancer, PA-C on 12/09/2015   Assessment and Plan:  1.  Breast cancer of upper-inner quadrant of right female breast ([place]).  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  Pt is s/p chemotherapy with AC followed by Taxol finished in March 2017, Herceptin completed on 11/15/2016.  Anastrozole switched to tamoxifen in January 2019 secondary to osteoporosis.  Patient tolerated tamoxifen okay, but self discontinued it secondary to vaginal discharge.  She is now back on anastrozole.  She reports she is tolerating therapy.  She is due to mammogram in 01/2019 and this is set up.  She will RTC in 01/2019 for follow-up.  Bilateral diagnostic mammogram done 02/02/2018 negative.  Continue Arimidex as directed.    2.  Lung nodule.  Pt had CT chest done 12/04/2017 that showed 4 mm right lower lobe lung nodule.  This was stable when compared to the scan in December 2017.  She desires to minimize number of clinic visits and consolidate appointments.  She will be set up for CT of chest without contrast in 01/2019.  She will follow-up at that time to go over results.    3.  Osteoporosis.  Pt is on Prolia 60 mg q 6 months.  She should continue calcium and vitamin D.  Last Dexa done 06/2016 showed  evidence of osteoporosis.  She just began Prolia in 11/2017.  Will defer repeat Dexa until after 18 months of therapy.  4.  HTN.  BP is 124/66.  Follow-up with PCP.    5  Health maintenance.  She has not undergone colonoscopy and reports she does not desire GI referral.      Interval History: Historical data obtained from note dated 03/14/2018.  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  Pt is s/p chemotherapy with AC followed by Taxol finished in March 2017, Herceptin completed on 11/15/2016. Anastrozole switched to tamoxifen in January 2019 secondary to osteoporosis.  Patient tolerated tamoxifen okay, but self discontinued it secondary to vaginal discharge.  She is now back on anastrozole.    Current Status:  Pt is seen today for follow-up.   She reports she is tolerating Arimidex.       Breast cancer of upper-inner quadrant of right female breast ([place])   09/01/2015 Mammogram    BI-RADS CATEGORY 0: Incomplete. Need additional imaging evaluation and/or prior mammograms for comparison.    09/15/2015 Imaging    CT chest- Right lower lobe 4 mm solid pulmonary nodule. If the patient is at high risk for bronchogenic carcinoma, follow-up chest CT at 1 year is recommended.    09/16/2015 Breast US    US showing a small hypoechoic irregular mass with hyperechoic rim in the 1 o'clock location of R breast 10 cm from the nipple. Mass measures 0.7 x 0.5 x 0.6 cm. 2nd mass is identified in the 1 o'clock location 9 cm from the nipple 0.7 x 0.7  x 0.6 cm    09/16/2015 Mammogram    Two suspicious masses in the 1 o'clock location of the right breast warranting tissue diagnosis.  No evidence for adenopathy.    09/21/2015 Pathology Results    Estrogen Receptor: 100%, POSITIVE, STRONG STAINING INTENSITY Progesterone Receptor: 10%, POSITIVE, MODERATE STAINING INTENSITY Proliferation Marker Ki67: 10%.  HER2 - **POSITIVE**    09/21/2015 Mammogram    Appropriate positioning of the 2 biopsy marking clips in  the upper-inner quadrant of the right breast at 1 o'clock.    09/21/2015 Initial Biopsy    Breast, right, needle core biopsy, 1:00 o'clock, 9 CMFN - INVASIVE DUCTAL CARCINOMA. - DUCTAL CARCINOMA IN SITU. - SEE COMMENT. 2. Breast, right, needle core biopsy, 1:00 o'clock, 10 CMFN - INVASIVE DUCTAL CARCINOMA. - DUCTAL CARCINOMA IN SITU WITH     10/26/2015 Procedure    Right chest porta cath placed with no adverse features.- Dr. Brantley Stage.    11/03/2015 Pathology Results    MULTIFOCAL INVASIVE DUCTAL CARCINOMA.  INVASIVE TUMOR IS 1.0 MM FROM NEAREST MARGIN (INFERIOR).  HIGH GRADE DUCTAL CARCINOMA IN SITU WITH NECROSIS.  IN SITU CARCINOMA IS 0.1 MM FROM THE NEAREST MARGIN (INFERIOR MARGIN).    11/03/2015 Procedure    Right lumpectomy by Dr. Brantley Stage.    11/17/2015 Initial Diagnosis    Breast cancer of upper-inner quadrant of right female breast ([place])    11/21/2015 Imaging    Normal left ventricular ejection fraction equal to 71.9%.    11/26/2015 -  Chemotherapy    Paclitaxel weekly x 12     11/26/2015 - 11/15/2016 Antibody Plan    Herceptin weekly with Paclitaxel.    02/15/2016 Echocardiogram    MUGA- Normal LEFT ventricular ejection fraction 65% slightly decreased from 72% on the previous exam.     04/18/2016 Imaging    MUGA- Normal left ventricular ejection fraction equal to 70.1%. This has increased from 65.2% previously.     07/26/2016 Echocardiogram    MUGA Left ventricular ejection fraction equals 73 %    10/27/2016 Imaging    MUGA- Normal LEFT ventricular ejection fraction of 68%, minimally decreased in a 73% on the previous exam.  Normal LEFT ventricular wall motion.    11/15/2016 -  Adjuvant Chemotherapy    Completed 52 weeks of Herceptin.    11/29/2016 Breast US    Ultrasound-guided biopsy of the mass in the right breast at the 11:30 position 3 cm from nipple performed and surgical path demonstrated fibrosis consistent with scar, no malignancy.    03/22/2017 Procedure    Chemoport  removal by Dr. Arnoldo Morale      Problem List Patient Active Problem List   Diagnosis Date Noted  . 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 Asthma [J45.909] 10/24/2016  . Diabetes mellitus ([place]) [E11.9] 10/24/2016  . Breast cancer of upper-inner quadrant of right female breast ([place]) [C50.211] 11/17/2015  . HTN (hypertension) [I10] 10/26/2015  . Lung nodule [R91.1] 10/26/2015  . Chest crackles [R09.89] 09/10/2015  . Cough [R05] 09/10/2015  . Hiatal hernia [K44.9] 09/10/2015  . COPD (chronic obstructive pulmonary disease) ([place]) [J44.9] 08/24/2011    Past Medical History Past Medical History:  Diagnosis Date  . Asthma   . Breast cancer ([place])    Right breast  . Cancer ([place]) 2016   right breast  . Diabetes mellitus without complication ([place])   . GERD (gastroesophageal reflux disease)   . Hypertension   . Radiation 03/23/16-05/09/16   45 Gy to right breast, boosted to 16  Gy    Past Surgical History Past Surgical History:  Procedure Laterality Date  . ABDOMINAL HYSTERECTOMY  1983  . BREAST LUMPECTOMY Right   . BREAST LUMPECTOMY WITH RADIOACTIVE SEED AND SENTINEL LYMPH NODE BIOPSY Right 11/03/2015   Procedure: RIGHT BREAST LUMPECTOMY WITH RADIOACTIVE SEED AND SENTINEL LYMPH NODE MAPPING;  Surgeon: Erroll Luna, MD;  Location: [facility];  Service: General;  Laterality: Right;  . NASAL FRACTURE SURGERY    . PORT-A-CATH REMOVAL Left 03/22/2017   Procedure: MINOR REMOVAL PORT-A-CATH;  Surgeon: Aviva Signs, MD;  Location: [facility];  Service: General;  Laterality: Left;  . PORTACATH PLACEMENT Right 11/03/2015   Procedure: INSERTION PORT-A-CATH;  Surgeon: Erroll Luna, MD;  Location: [facility];  Service: General;  Laterality: Right;    Family History Family History  Problem Relation Age of Onset  . Breast cancer Mother   . Leukemia Father      Social History  reports that she has never smoked. She has never used smokeless tobacco. She  reports that she does not drink alcohol or use drugs.  Medications  Current Outpatient Medications:  .  albuterol (PROAIR HFA) 108 (90 BASE) MCG/ACT inhaler, Inhale 2 puffs into the lungs every 6 (six) hours as needed for wheezing or shortness of breath., Disp: , Rfl:  .  amLODipine (NORVASC) 5 MG tablet, Take 5 mg by mouth daily., Disp: , Rfl: 9 .  atorvastatin (LIPITOR) 10 MG tablet, Take 10 mg by mouth daily., Disp: , Rfl:  .  Calcium-Vitamin D-Vitamin K (VIACTIV) 578-469-62 MG-UNT-MCG CHEW, Chew 1 each by mouth daily., Disp: , Rfl:  .  Cholecalciferol (VITAMIN D3) 2000 units TABS, Take 1 tablet by mouth daily. , Disp: , Rfl:  .  dexamethasone (DECADRON) 4 MG tablet, TAKE 2 TABLETS WEEKLY, Disp: , Rfl: 12 .  losartan (COZAAR) 25 MG tablet, Take 25 mg by mouth daily., Disp: , Rfl:  .  metFORMIN (GLUCOPHAGE-XR) 500 MG 24 hr tablet, TAKE 2 TABLETS BY MOUTH ONCE A DAY WITH A MEAL, Disp: , Rfl: 0 .  omeprazole (PRILOSEC) 40 MG capsule, Take 40 mg by mouth daily., Disp: , Rfl:  .  SYMBICORT 160-4.5 MCG/ACT inhaler, Inhale 2 puffs into the lungs 2 (two) times daily., Disp: , Rfl: 12  Allergies Lisinopril; Losartan; and Penicillins  Review of Systems Review of Systems - Oncology ROS negative   Physical Exam  Vitals Wt Readings from Last 3 Encounters:  07/17/18 113 lb 11.2 oz (51.6 kg)  03/14/18 111 lb 4.8 oz (50.5 kg)  12/13/17 112 lb 9.6 oz (51.1 kg)   Temp Readings from Last 3 Encounters:  07/17/18 98.2 F (36.8 C) (Oral)  06/13/18 98 F (36.7 C) (Oral)  03/14/18 98.2 F (36.8 C) (Oral)   BP Readings from Last 3 Encounters:  07/17/18 124/66  06/13/18 (!) 146/58  03/14/18 137/87   Pulse Readings from Last 3 Encounters:  07/17/18 74  06/13/18 66  03/14/18 68   Constitutional: Well-developed, well-nourished, and in no distress.   HENT: Head: Normocephalic and atraumatic.  Mouth/Throat: No oropharyngeal exudate. Mucosa moist. Eyes: Pupils are equal, round, and reactive  to light. Conjunctivae are normal. No scleral icterus.  Neck: Normal range of motion. Neck supple. No JVD present.  Cardiovascular: Normal rate, regular rhythm and normal heart sounds.  Exam reveals no gallop and no friction rub.   No murmur heard. Pulmonary/Chest: Effort normal and breath sounds normal. No respiratory distress. No wheezes.No rales.  Abdominal: Soft. Bowel  sounds are normal. No distension. There is no tenderness. There is no guarding.  Musculoskeletal: No edema or tenderness.  Lymphadenopathy: No cervical, axillary or supraclavicular adenopathy.  Neurological: Alert and oriented to person, place, and time. No cranial nerve deficit.  Skin: Skin is warm and dry. No rash noted. No erythema. No pallor.  Psychiatric: Affect and judgment normal.  Bilateral breast exam:  Chaperone present.  Lumpectomy changes in right breast.  No dominant masses palpable bilaterally.    Labs No visits with results within 3 Day(s) from this visit.  Latest known visit with results is:  Lab on 07/10/2018  Component Date Value Ref Range Status  . WBC 07/10/2018 9.6  4.0 - 10.5 K/uL Final  . RBC 07/10/2018 3.96  3.87 - 5.11 MIL/uL Final  . Hemoglobin 07/10/2018 12.1  12.0 - 15.0 g/dL Final  . HCT 07/10/2018 37.8  36.0 - 46.0 % Final  . MCV 07/10/2018 95.5  78.0 - 100.0 fL Final  . MCH 07/10/2018 30.6  26.0 - 34.0 pg Final  . MCHC 07/10/2018 32.0  30.0 - 36.0 g/dL Final  . RDW 07/10/2018 12.9  11.5 - 15.5 % Final  . Platelets 07/10/2018 222  150 - 400 K/uL Final  . Neutrophils Relative % 07/10/2018 49  % Final  . Neutro Abs 07/10/2018 4.8  1.7 - 7.7 K/uL Final  . Lymphocytes Relative 07/10/2018 36  % Final  . Lymphs Abs 07/10/2018 3.4  0.7 - 4.0 K/uL Final  . Monocytes Relative 07/10/2018 8  % Final  . Monocytes Absolute 07/10/2018 0.7  0.1 - 1.0 K/uL Final  . Eosinophils Relative 07/10/2018 6  % Final  . Eosinophils Absolute 07/10/2018 0.6  0.0 - 0.7 K/uL Final  . Basophils Relative 07/10/2018  1  % Final  . Basophils Absolute 07/10/2018 0.1  0.0 - 0.1 K/uL Final   Performed at [facility], [address] [place], [place] [address]  . Sodium 07/10/2018 142  135 - 145 mmol/L Final  . Potassium 07/10/2018 4.2  3.5 - 5.1 mmol/L Final  . Chloride 07/10/2018 109  98 - 111 mmol/L Final  . CO2 07/10/2018 26  22 - 32 mmol/L Final  . Glucose, Bld 07/10/2018 119* 70 - 99 mg/dL Final  . BUN 07/10/2018 13  8 - 23 mg/dL Final  . Creatinine, Ser 07/10/2018 1.11* 0.44 - 1.00 mg/dL Final  . Calcium 07/10/2018 9.0  8.9 - 10.3 mg/dL Final  . Total Protein 07/10/2018 6.4* 6.5 - 8.1 g/dL Final  . Albumin 07/10/2018 3.7  3.5 - 5.0 g/dL Final  . AST 07/10/2018 18  15 - 41 U/L Final  . ALT 07/10/2018 14  0 - 44 U/L Final  . Alkaline Phosphatase 07/10/2018 54  38 - 126 U/L Final  . Total Bilirubin 07/10/2018 1.1  0.3 - 1.2 mg/dL Final  . GFR calc non Af Amer 07/10/2018 47* >60 mL/min Final  . GFR calc Af Amer 07/10/2018 54* >60 mL/min Final   Comment: (NOTE) The eGFR has been calculated using the CKD EPI equation. This calculation has not been validated in all clinical situations. eGFR's persistently <60 mL/min signify possible Chronic Kidney Disease.   Georgiann Hahn gap 07/10/2018 7  5 - 15 Final   Performed at [facility], [address], [place], [place] [address]     Pathology Orders Placed This Encounter  Procedures  . CT Chest Wo Contrast    Standing Status:   Future    Standing Expiration Date:  07/17/2019    Order Specific Question:   Preferred imaging location?    Answer:   [facility]    Order Specific Question:   Radiology Contrast Protocol - do NOT remove file path    Answer:   [email or web address]  . MM DIAG BREAST TOMO BILATERAL    Put in correct /wrong test order /adv [place] l/r needs to be put in Leanna Battles    Standing Status:   Future    Standing Expiration Date:   09/17/2019    Order Specific Question:   Reason for Exam (SYMPTOM  OR DIAGNOSIS REQUIRED)     Answer:   malignant neoplasm of breast    Order Specific Question:   Preferred imaging location?    Answer:   [facility]  . CBC with Differential/Platelet    Standing Status:   Future    Standing Expiration Date:   07/17/2020  . Comprehensive metabolic panel    Standing Status:   Future    Standing Expiration Date:   07/17/2020  . Lactate dehydrogenase    Standing Status:   Future    Standing Expiration Date:   07/17/2020       Zoila Shutter MD

## 2018-07-17 NOTE — Patient Instructions (Signed)
[facility] at [facility] Discharge Instructions  You saw Dr. Higgs today.   Thank you for choosing [facility] at [facility] to provide your oncology and hematology care.  To afford each patient quality time with our provider, please arrive at least 15 minutes before your scheduled appointment time.   If you have a lab appointment with the Cancer Center please come in thru the  Main Entrance and check in at the main information desk  You need to re-schedule your appointment should you arrive 10 or more minutes late.  We strive to give you quality time with our providers, and arriving late affects you and other patients whose appointments are after yours.  Also, if you no show three or more times for appointments you may be dismissed from the clinic at the providers discretion.     Again, thank you for choosing [facility].  Our hope is that these requests will decrease the amount of time that you wait before being seen by our physicians.       _____________________________________________________________  Should you have questions after your visit to [facility], please contact our office at [phone] between the hours of 8:00 a.m. and 4:30 p.m.  Voicemails left after 4:00 p.m. will not be returned until the following business day.  For prescription refill requests, have your pharmacy contact our office and allow 72 hours.    Cancer Center Support Programs:   > Cancer Support Group  2nd Tuesday of the month 1pm-2pm, Journey Room    

## 2018-07-18 ENCOUNTER — Other Ambulatory Visit (HOSPITAL_COMMUNITY): Payer: Self-pay | Admitting: Internal Medicine

## 2018-08-25 ENCOUNTER — Other Ambulatory Visit: Payer: Self-pay | Admitting: Nurse Practitioner

## 2018-09-06 DIAGNOSIS — E1121 Type 2 diabetes mellitus with diabetic nephropathy: Secondary | ICD-10-CM | POA: Diagnosis not present

## 2018-09-06 DIAGNOSIS — N183 Chronic kidney disease, stage 3 (moderate): Secondary | ICD-10-CM | POA: Diagnosis not present

## 2018-09-06 DIAGNOSIS — J479 Bronchiectasis, uncomplicated: Secondary | ICD-10-CM | POA: Diagnosis not present

## 2018-09-06 DIAGNOSIS — Z170 Estrogen receptor positive status [ER+]: Secondary | ICD-10-CM | POA: Diagnosis not present

## 2018-09-06 DIAGNOSIS — Z79899 Other long term (current) drug therapy: Secondary | ICD-10-CM | POA: Diagnosis not present

## 2018-09-06 DIAGNOSIS — E113293 Type 2 diabetes mellitus with mild nonproliferative diabetic retinopathy without macular edema, bilateral: Secondary | ICD-10-CM | POA: Diagnosis not present

## 2018-09-06 DIAGNOSIS — L84 Corns and callosities: Secondary | ICD-10-CM | POA: Diagnosis not present

## 2018-09-06 DIAGNOSIS — Z0000 Encounter for general adult medical examination without abnormal findings: Secondary | ICD-10-CM | POA: Diagnosis not present

## 2018-09-06 DIAGNOSIS — Z1389 Encounter for screening for other disorder: Secondary | ICD-10-CM | POA: Diagnosis not present

## 2018-09-06 DIAGNOSIS — J45909 Unspecified asthma, uncomplicated: Secondary | ICD-10-CM | POA: Diagnosis not present

## 2018-09-06 DIAGNOSIS — M810 Age-related osteoporosis without current pathological fracture: Secondary | ICD-10-CM | POA: Diagnosis not present

## 2018-09-06 DIAGNOSIS — C50911 Malignant neoplasm of unspecified site of right female breast: Secondary | ICD-10-CM | POA: Diagnosis not present

## 2018-09-06 DIAGNOSIS — E7800 Pure hypercholesterolemia, unspecified: Secondary | ICD-10-CM | POA: Diagnosis not present

## 2018-09-06 DIAGNOSIS — I129 Hypertensive chronic kidney disease with stage 1 through stage 4 chronic kidney disease, or unspecified chronic kidney disease: Secondary | ICD-10-CM | POA: Diagnosis not present

## 2018-09-06 DIAGNOSIS — Z23 Encounter for immunization: Secondary | ICD-10-CM | POA: Diagnosis not present

## 2018-10-25 DIAGNOSIS — H26493 Other secondary cataract, bilateral: Secondary | ICD-10-CM | POA: Diagnosis not present

## 2018-11-29 ENCOUNTER — Ambulatory Visit (HOSPITAL_COMMUNITY): Payer: Medicare Other

## 2018-11-29 ENCOUNTER — Other Ambulatory Visit (HOSPITAL_COMMUNITY): Payer: Medicare Other

## 2019-01-21 ENCOUNTER — Other Ambulatory Visit (HOSPITAL_COMMUNITY): Payer: Self-pay | Admitting: Internal Medicine

## 2019-01-21 DIAGNOSIS — Z170 Estrogen receptor positive status [ER+]: Principal | ICD-10-CM

## 2019-01-21 DIAGNOSIS — C50211 Malignant neoplasm of upper-inner quadrant of right female breast: Secondary | ICD-10-CM

## 2019-02-05 ENCOUNTER — Inpatient Hospital Stay (HOSPITAL_COMMUNITY): Payer: Medicare Other | Attending: Internal Medicine

## 2019-02-05 ENCOUNTER — Ambulatory Visit (HOSPITAL_COMMUNITY): Payer: Medicare Other

## 2019-02-05 ENCOUNTER — Ambulatory Visit (HOSPITAL_COMMUNITY)
Admission: RE | Admit: 2019-02-05 | Discharge: 2019-02-05 | Disposition: A | Discharge status: Home / Self Care | Payer: Medicare Other | Source: Ambulatory Visit | Attending: Internal Medicine | Admitting: Internal Medicine

## 2019-02-05 ENCOUNTER — Other Ambulatory Visit: Payer: Self-pay

## 2019-02-05 DIAGNOSIS — C50211 Malignant neoplasm of upper-inner quadrant of right female breast: Secondary | ICD-10-CM | POA: Insufficient documentation

## 2019-02-05 DIAGNOSIS — R922 Inconclusive mammogram: Secondary | ICD-10-CM | POA: Diagnosis not present

## 2019-02-05 DIAGNOSIS — M810 Age-related osteoporosis without current pathological fracture: Secondary | ICD-10-CM | POA: Diagnosis not present

## 2019-02-05 DIAGNOSIS — Z170 Estrogen receptor positive status [ER+]: Secondary | ICD-10-CM | POA: Insufficient documentation

## 2019-02-05 DIAGNOSIS — R918 Other nonspecific abnormal finding of lung field: Secondary | ICD-10-CM | POA: Diagnosis not present

## 2019-02-05 DIAGNOSIS — R911 Solitary pulmonary nodule: Secondary | ICD-10-CM | POA: Insufficient documentation

## 2019-02-05 DIAGNOSIS — Z79811 Long term (current) use of aromatase inhibitors: Secondary | ICD-10-CM | POA: Insufficient documentation

## 2019-02-05 LAB — CBC WITH DIFFERENTIAL/PLATELET
Abs Immature Granulocytes: 0.12 10*3/uL — ABNORMAL HIGH (ref 0.00–0.07)
Basophils Absolute: 0.1 10*3/uL (ref 0.0–0.1)
Basophils Relative: 1 %
Eosinophils Absolute: 0.5 10*3/uL (ref 0.0–0.5)
Eosinophils Relative: 4 %
HEMATOCRIT: 39.1 % (ref 36.0–46.0)
HEMOGLOBIN: 12.1 g/dL (ref 12.0–15.0)
IMMATURE GRANULOCYTES: 1 %
LYMPHS ABS: 3.2 10*3/uL (ref 0.7–4.0)
LYMPHS PCT: 29 %
MCH: 30.2 pg (ref 26.0–34.0)
MCHC: 30.9 g/dL (ref 30.0–36.0)
MCV: 97.5 fL (ref 80.0–100.0)
Monocytes Absolute: 0.8 10*3/uL (ref 0.1–1.0)
Monocytes Relative: 7 %
NEUTROS PCT: 58 %
Neutro Abs: 6.1 10*3/uL (ref 1.7–7.7)
Platelets: 278 10*3/uL (ref 150–400)
RBC: 4.01 MIL/uL (ref 3.87–5.11)
RDW: 13 % (ref 11.5–15.5)
WBC: 10.8 10*3/uL — ABNORMAL HIGH (ref 4.0–10.5)
nRBC: 0 % (ref 0.0–0.2)

## 2019-02-05 LAB — COMPREHENSIVE METABOLIC PANEL
ALBUMIN: 3.7 g/dL (ref 3.5–5.0)
ALK PHOS: 52 U/L (ref 38–126)
ALT: 15 U/L (ref 0–44)
AST: 19 U/L (ref 15–41)
Anion gap: 9 (ref 5–15)
BILIRUBIN TOTAL: 0.6 mg/dL (ref 0.3–1.2)
BUN: 13 mg/dL (ref 8–23)
CALCIUM: 9 mg/dL (ref 8.9–10.3)
CO2: 26 mmol/L (ref 22–32)
Chloride: 104 mmol/L (ref 98–111)
Creatinine, Ser: 0.99 mg/dL (ref 0.44–1.00)
GFR calc Af Amer: >60 mL/min (ref >60)
GFR calc non Af Amer: 55 mL/min — ABNORMAL LOW (ref >60)
GLUCOSE: 273 mg/dL — AB (ref 70–99)
POTASSIUM: 3.7 mmol/L (ref 3.5–5.1)
SODIUM: 139 mmol/L (ref 135–145)
Total Protein: 6.1 g/dL — ABNORMAL LOW (ref 6.5–8.1)

## 2019-02-05 LAB — LACTATE DEHYDROGENASE: LDH: 124 U/L (ref 98–192)

## 2019-02-06 ENCOUNTER — Inpatient Hospital Stay (HOSPITAL_COMMUNITY): Payer: Medicare Other

## 2019-02-06 ENCOUNTER — Ambulatory Visit (HOSPITAL_COMMUNITY): Payer: Medicare Other | Admitting: Nurse Practitioner

## 2019-02-06 ENCOUNTER — Other Ambulatory Visit: Payer: Self-pay

## 2019-02-06 ENCOUNTER — Inpatient Hospital Stay (HOSPITAL_BASED_OUTPATIENT_CLINIC_OR_DEPARTMENT_OTHER): Payer: Medicare Other | Admitting: Nurse Practitioner

## 2019-02-06 ENCOUNTER — Encounter (HOSPITAL_COMMUNITY): Payer: Self-pay | Admitting: Nurse Practitioner

## 2019-02-06 ENCOUNTER — Ambulatory Visit (HOSPITAL_COMMUNITY): Payer: Medicare Other

## 2019-02-06 DIAGNOSIS — Z170 Estrogen receptor positive status [ER+]: Secondary | ICD-10-CM | POA: Diagnosis not present

## 2019-02-06 DIAGNOSIS — C50211 Malignant neoplasm of upper-inner quadrant of right female breast: Secondary | ICD-10-CM

## 2019-02-06 DIAGNOSIS — Z79811 Long term (current) use of aromatase inhibitors: Secondary | ICD-10-CM

## 2019-02-06 DIAGNOSIS — R911 Solitary pulmonary nodule: Secondary | ICD-10-CM | POA: Diagnosis not present

## 2019-02-06 DIAGNOSIS — M810 Age-related osteoporosis without current pathological fracture: Secondary | ICD-10-CM

## 2019-02-06 MED ORDER — DENOSUMAB 60 MG/ML ~~LOC~~ SOSY
PREFILLED_SYRINGE | SUBCUTANEOUS | Status: AC
  Filled 2019-02-06: qty 1

## 2019-02-06 MED ORDER — DENOSUMAB 60 MG/ML ~~LOC~~ SOSY
60.0000 mg | PREFILLED_SYRINGE | Freq: Once | SUBCUTANEOUS | Status: AC
  Administered 2019-02-06: 60 mg via SUBCUTANEOUS

## 2019-02-06 NOTE — Assessment & Plan Note (Addendum)
1.  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 -Anastrozole switched to tamoxifen in January 2019 secondary to osteoporosis. -Tamoxifen was tolerated well but self discontinued by the patient due to vaginal discharge. - Anastrozole was restarted on 03/14/2018. -Today's examination did not reveal any palpable masses. - Her latest mammogram on 02/05/2019 was BI-RADS category 2 benign. - She will follow-up in 6 months with labs.  2. Osteoporosis: - Patient was started on Prolia 60 mg every 6 months. -She started Prolia January 2019. -She will get her injection today on 02/06/2019. -She is also taking calcium 3 times a day.  And vitamin D3 2 times a day. -Her last DEXA scan was on 06/2016 which showed evidence of osteoporosis - We will repeat a DEXA scan 18 months after therapy.  3.  Lung nodule: - CT scan on 02/05/2019 showed unchanged 4 mm pulmonary nodule on the right lower lobe, stable for greater than 2 years and benign.   -No routine CT follow-up is recommended for this nodule.

## 2019-02-06 NOTE — Progress Notes (Signed)
[address] [place], [place] [address]   CLINIC:  Medical Oncology/Hematology  PCP:  Lajean Manes, MD 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 Need additional imaging evaluation and/or prior mammograms for comparison.    09/15/2015 Imaging    CT chest- Right lower lobe 4 mm solid pulmonary nodule. If the patient is at high risk for bronchogenic carcinoma, follow-up chest CT at 1 year is recommended.    09/16/2015 Breast US    US showing a small hypoechoic irregular mass with hyperechoic rim in the 1 o'clock location of R breast 10 cm from the nipple. Mass measures 0.7 x 0.5 x 0.6 cm. 2nd mass is identified in the 1 o'clock location 9 cm from the nipple 0.7 x 0.7 x 0.6 cm    09/16/2015 Mammogram    Two suspicious masses in the 1 o'clock location of the right breast warranting tissue diagnosis.  No evidence for adenopathy.    09/21/2015 Pathology Results    Estrogen Receptor: 100%, POSITIVE, STRONG STAINING INTENSITY Progesterone Receptor: 10%, POSITIVE, MODERATE STAINING INTENSITY Proliferation Marker Ki67: 10%.  HER2 - **POSITIVE**    09/21/2015 Mammogram    Appropriate positioning of the 2 biopsy marking clips in the upper-inner quadrant of the right breast at 1 o'clock.    09/21/2015 Initial Biopsy    Breast, right, needle core biopsy, 1:00 o'clock, 9 CMFN - INVASIVE DUCTAL CARCINOMA. - DUCTAL CARCINOMA IN SITU. - SEE COMMENT. 2. Breast, right, needle core biopsy, 1:00 o'clock, 10 CMFN - INVASIVE DUCTAL CARCINOMA. - DUCTAL CARCINOMA IN SITU WITH     10/26/2015 Procedure    Right chest porta cath placed with no adverse features.- Dr. Brantley Stage.    11/03/2015  Pathology Results    MULTIFOCAL INVASIVE DUCTAL CARCINOMA.  INVASIVE TUMOR IS 1.0 MM FROM NEAREST MARGIN (INFERIOR).  HIGH GRADE DUCTAL CARCINOMA IN SITU WITH NECROSIS.  IN SITU CARCINOMA IS 0.1 MM FROM THE NEAREST MARGIN (INFERIOR MARGIN).    11/03/2015 Procedure    Right lumpectomy by Dr. Brantley Stage.    11/17/2015 Initial Diagnosis    Breast cancer of upper-inner quadrant of right female breast ([place])    11/21/2015 Imaging    Normal left ventricular ejection fraction equal to 71.9%.    11/26/2015 -  Chemotherapy    Paclitaxel weekly x 12     11/26/2015 - 11/15/2016 Antibody Plan    Herceptin weekly with Paclitaxel.    02/15/2016 Echocardiogram    MUGA- Normal LEFT ventricular ejection fraction 65% slightly decreased from 72% on the previous exam.     04/18/2016 Imaging    MUGA- Normal left ventricular ejection fraction equal to 70.1%. This has increased from 65.2% previously.     07/26/2016 Echocardiogram    MUGA Left ventricular ejection fraction equals 73 %    10/27/2016 Imaging    MUGA- Normal LEFT ventricular ejection fraction of 68%, minimally decreased in a 73% on the previous exam.  Normal LEFT ventricular wall motion.    11/15/2016 -  Adjuvant Chemotherapy    Completed 52 weeks of Herceptin.    11/29/2016 Breast US    Ultrasound-guided biopsy of the mass in the right breast at the 11:30 position 3  cm from nipple performed and surgical path demonstrated fibrosis consistent with scar, no malignancy.    03/22/2017 Procedure    Chemoport removal by Dr. Arnoldo Morale      CANCER STAGING: Cancer Staging Breast cancer of upper-inner quadrant of right female breast [facility]) Staging form: Breast, AJCC 7th Edition - Pathologic stage from 12/09/2015: Stage IA (T1c, N0, cM0) - Signed by Baird Cancer, PA-C on 12/09/2015    INTERVAL HISTORY:  Ms. Turko 78 y.o. female returns for routine follow-up for right breast cancer.  She is here today alone.  She is doing well she does report some mild  fatigue throughout the day.  She denies any major hot flashes or any side effects from the anastrozole.  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  It is causing her to have hip pain and neck pain on her right side.  She is trying to utilize her family to help her more.  She [place] time at [place] beach in [place] and half the time appearing [place]. Denies any nausea, vomiting, or diarrhea. Denies any new pains. Had not noticed any recent bleeding such as epistaxis, hematuria or hematochezia. Denies recent chest pain on exertion, shortness of breath on minimal exertion, pre-syncopal episodes, or palpitations. Denies any numbness or tingling in hands or feet. Denies any recent fevers, infections, or recent hospitalizations. Patient reports appetite at 100% and energy level at 50%.  She reports her appetite is slightly decreased and she is lost 4 pounds since her last visit 6 months ago.  She is trying to drink protein shakes to help.   REVIEW OF SYSTEMS:  Review of Systems  Constitutional: Positive for appetite change and fatigue.  Musculoskeletal: Positive for neck pain.  All other systems reviewed and are negative.    PAST MEDICAL/SURGICAL HISTORY:  Past Medical History:  Diagnosis Date  . Asthma   . Breast cancer ([place])    Right breast  . Cancer ([place]) 2016   right breast  . Diabetes mellitus without complication ([place])   . GERD (gastroesophageal reflux disease)   . Hypertension   . Radiation 03/23/16-05/09/16   45 Gy to right breast, boosted to 16 Gy   Past Surgical History:  Procedure Laterality Date  . ABDOMINAL HYSTERECTOMY  1983  . BREAST LUMPECTOMY Right   . BREAST LUMPECTOMY WITH RADIOACTIVE SEED AND SENTINEL LYMPH NODE BIOPSY Right 11/03/2015   Procedure: RIGHT BREAST LUMPECTOMY WITH RADIOACTIVE SEED AND SENTINEL LYMPH NODE MAPPING;  Surgeon: Erroll Luna, MD;  Location: [facility];  Service: General;  Laterality: Right;  . NASAL FRACTURE SURGERY    . PORT-A-CATH REMOVAL Left 03/22/2017   Procedure: MINOR REMOVAL PORT-A-CATH;  Surgeon: Aviva Signs, MD;  Location: [facility];  Service: General;  Laterality: Left;  . PORTACATH PLACEMENT Right 11/03/2015   Procedure: INSERTION PORT-A-CATH;  Surgeon: Erroll Luna, MD;  Location: [facility];  Service: General;  Laterality: Right;     SOCIAL HISTORY:  Social History   Socioeconomic History  . Marital status: Married    Spouse name: Not on file  . Number of children: Not on file  . Years of education: Not on file  . Highest education level: Not on file  Occupational History  . Occupation: retired  Scientific laboratory technician  . Financial resource strain: Not on file  . Food insecurity:    Worry: Not on  file    Inability: Not on file  . Transportation needs:    Medical: Not on file    Non-medical: Not on file  Tobacco Use  . Smoking status: Never Smoker  . Smokeless tobacco: Never Used  Substance and Sexual Activity  . Alcohol use: No    Alcohol/week: 0.0 standard drinks  . Drug use: No  . Sexual activity: Never    Birth control/protection: Surgical  Lifestyle  . Physical activity:    Days per week: Not on file    Minutes per session: Not on file  . Stress: Not on file  Relationships  . Social connections:    Talks on phone: Not on file    Gets together: Not on file    Attends religious service: Not on file    Active member of club or organization: Not on file    Attends meetings of clubs or organizations: Not on file    Relationship status: Not on file  . Intimate partner violence:    Fear of current or ex partner: Not on file    Emotionally abused: Not on file    Physically abused: Not on file    Forced sexual activity: Not on file  Other Topics Concern  . Not on file  Social History Narrative  . Not on file    FAMILY HISTORY:  Family History  Problem Relation Age of Onset  .  Breast cancer Mother   . Leukemia Father     CURRENT MEDICATIONS:  Outpatient Encounter Medications as of 02/06/2019  Medication Sig Note  . albuterol (PROAIR HFA) 108 (90 BASE) MCG/ACT inhaler Inhale 2 puffs into the lungs every 6 (six) hours as needed for wheezing or shortness of breath.   Marland Kitchen amLODipine (NORVASC) 5 MG tablet Take 5 mg by mouth daily.   Marland Kitchen atorvastatin (LIPITOR) 10 MG tablet Take 10 mg by mouth daily.   . Calcium-Vitamin D-Vitamin K (VIACTIV) 462-703-50 MG-UNT-MCG CHEW Chew 1 each by mouth daily.   . Cholecalciferol (VITAMIN D3) 2000 units TABS Take 1 tablet by mouth daily.    Marland Kitchen dexamethasone (DECADRON) 4 MG tablet TAKE 2 TABLETS WEEKLY 03/16/2017: Wednesdays  . losartan (COZAAR) 25 MG tablet Take 25 mg by mouth daily.   . metFORMIN (GLUCOPHAGE-XR) 500 MG 24 hr tablet TAKE 2 TABLETS BY MOUTH ONCE A DAY WITH A MEAL   . omeprazole (PRILOSEC) 40 MG capsule Take 40 mg by mouth daily.   . SYMBICORT 160-4.5 MCG/ACT inhaler Inhale 2 puffs into the lungs 2 (two) times daily.   . [EXPIRED] denosumab (PROLIA) injection 60 mg     No facility-administered encounter medications on file as of 02/06/2019.     ALLERGIES:  Allergies  Allergen Reactions  . Lisinopril Cough  . Losartan Hives    Questionable losartan vs lidocaine reaction  . Penicillins Rash     PHYSICAL EXAM:  ECOG Performance status: 1  Vitals:   02/06/19 0930  BP: (!) 144/69  Pulse: 74  Resp: 16  Temp: 98.3 F (36.8 C)  SpO2: 100%   Filed Weights   02/06/19 0930  Weight: 108 lb (49 kg)    Physical Exam Constitutional:      Appearance: Normal appearance. She is normal weight.  Cardiovascular:     Rate and Rhythm: Normal rate and regular rhythm.     Heart sounds: Normal heart sounds.  Pulmonary:     Effort: Pulmonary effort is normal.     Breath sounds: Normal breath sounds.  Abdominal:     General: Abdomen is flat. Bowel sounds are normal.     Palpations: Abdomen is soft.  Musculoskeletal:  Normal range of motion.  Skin:    General: Skin is warm and dry.  Neurological:     Mental Status: She is alert and oriented to person, place, and time. Mental status is at baseline.  Psychiatric:        Mood and Affect: Mood normal.        Behavior: Behavior normal.        Thought Content: Thought content normal.        Judgment: Judgment normal.   Breast:    No palpable masses, no skin changes or nipple discharge, no adenopathy. Breast exam performed.    LABORATORY DATA:  I have reviewed the labs as listed.  CBC    Component Value Date/Time   WBC 10.8 (H) 02/05/2019 0910   RBC 4.01 02/05/2019 0910   HGB 12.1 02/05/2019 0910   HCT 39.1 02/05/2019 0910   PLT 278 02/05/2019 0910   MCV 97.5 02/05/2019 0910   MCH 30.2 02/05/2019 0910   MCHC 30.9 02/05/2019 0910   RDW 13.0 02/05/2019 0910   LYMPHSABS 3.2 02/05/2019 0910   MONOABS 0.8 02/05/2019 0910   EOSABS 0.5 02/05/2019 0910   BASOSABS 0.1 02/05/2019 0910   CMP Latest Ref Rng & Units 02/05/2019 07/10/2018 06/13/2018  Glucose 70 - 99 mg/dL 273(H) 119(H) 144(H)  BUN 8 - 23 mg/dL '13 13 11  '$ Creatinine 0.44 - 1.00 mg/dL 0.99 1.11(H) 0.98  Sodium 135 - 145 mmol/L 139 142 140  Potassium 3.5 - 5.1 mmol/L 3.7 4.2 4.3  Chloride 98 - 111 mmol/L 104 109 104  CO2 22 - 32 mmol/L '26 26 28  '$ Calcium 8.9 - 10.3 mg/dL 9.0 9.0 9.0  Total Protein 6.5 - 8.1 g/dL 6.1(L) 6.4(L) 6.4(L)  Total Bilirubin 0.3 - 1.2 mg/dL 0.6 1.1 1.5(H)  Alkaline Phos 38 - 126 U/L 52 54 55  AST 15 - 41 U/L '19 18 20  '$ ALT 0 - 44 U/L '15 14 16       '$ DIAGNOSTIC IMAGING:  I have independently reviewed the Chest CT scan and her mammogram and discussed with the patient.  .  I personally performed a face-to-face visit, made revisions and my assessment and plan is as follows.  Labs reviewed with the patient today.  ASSESSMENT & PLAN:   Breast cancer of upper-inner quadrant of right female breast ([place]) 1.  Stage Ia (T1CN0) triple positive right breast cancer: -  Status post chemotherapy with AC followed by Taxol finished in March 2007, Herceptin completed on 11/15/2016. -Anastrozole switched to tamoxifen in January 2019 secondary to osteoporosis. -Tamoxifen was tolerated well but self discontinued by the patient due to vaginal discharge. - Anastrozole was restarted on 03/14/2018. -Today's examination did not reveal any palpable masses. - Her latest mammogram on 02/05/2019 was BI-RADS category 2 benign. - She will follow-up in 6 months with labs.  2. Osteoporosis: - Patient was started on Prolia 60 mg every 6 months. -She started Prolia January 2019. -She will get her injection today on 02/06/2019. -She is also taking calcium 3 times a day.  And vitamin D3 2 times a day. -Her last DEXA scan was on 06/2016 which showed evidence of osteoporosis - We will repeat a DEXA scan 18 months after therapy.  3.  Lung nodule: - CT scan on 02/05/2019 showed unchanged 4 mm pulmonary nodule on the right lower lobe,  stable for greater than 2 years and benign.   -No routine CT follow-up is recommended for this nodule.      Orders placed this encounter:  Orders Placed This Encounter  Procedures  . Lactate dehydrogenase  . CBC with Differential/Platelet  . Comprehensive metabolic panel  . VITAMIN D 25 Hydroxy (Vit-D Deficiency, Fractures)  . Vitamin B12  . [place] FNP-C [place] [phone]

## 2019-02-06 NOTE — Patient Instructions (Signed)
[place] at [facility] Discharge Instructions  Follow up in 6 months with labs and prolia injection at this time.    Thank you for choosing [place] at [facility] to provide your oncology and hematology care.  To afford each patient quality time with our provider, please arrive at least 15 minutes before your scheduled appointment time.   If you have a lab appointment with the [facility] please come in thru the  Main Entrance and check in at the main information desk  You need to re-schedule your appointment should you arrive 10 or more minutes late.  We strive to give you quality time with our providers, and arriving late affects you and other patients whose appointments are after yours.  Also, if you no show three or more times for appointments you may be dismissed from the clinic at the providers discretion.     Again, thank you for choosing [facility].  Our hope is that these requests will decrease the amount of time that you wait before being seen by our physicians.       _____________________________________________________________  Should you have questions after your visit to [facility], please contact our office at [phone] between the hours of 8:00 a.m. and 4:30 p.m.  Voicemails left after 4:00 p.m. will not be returned until the following business day.  For prescription refill requests, have your pharmacy contact our office and allow 72 hours.    Cancer Center Support Programs:   > Cancer Support Group  2nd Tuesday of the month 1pm-2pm, Journey Room

## 2019-02-07 ENCOUNTER — Ambulatory Visit (HOSPITAL_COMMUNITY): Payer: Medicare Other | Admitting: Nurse Practitioner

## 2019-02-07 ENCOUNTER — Ambulatory Visit (HOSPITAL_COMMUNITY): Payer: Medicare Other

## 2019-02-07 DIAGNOSIS — N183 Chronic kidney disease, stage 3 (moderate): Secondary | ICD-10-CM | POA: Diagnosis not present

## 2019-02-07 DIAGNOSIS — J45909 Unspecified asthma, uncomplicated: Secondary | ICD-10-CM | POA: Diagnosis not present

## 2019-02-07 DIAGNOSIS — J479 Bronchiectasis, uncomplicated: Secondary | ICD-10-CM | POA: Diagnosis not present

## 2019-02-07 DIAGNOSIS — C50911 Malignant neoplasm of unspecified site of right female breast: Secondary | ICD-10-CM | POA: Diagnosis not present

## 2019-02-07 DIAGNOSIS — E1121 Type 2 diabetes mellitus with diabetic nephropathy: Secondary | ICD-10-CM | POA: Diagnosis not present

## 2019-02-07 DIAGNOSIS — I129 Hypertensive chronic kidney disease with stage 1 through stage 4 chronic kidney disease, or unspecified chronic kidney disease: Secondary | ICD-10-CM | POA: Diagnosis not present

## 2019-02-07 DIAGNOSIS — K219 Gastro-esophageal reflux disease without esophagitis: Secondary | ICD-10-CM | POA: Diagnosis not present

## 2019-02-07 DIAGNOSIS — E113293 Type 2 diabetes mellitus with mild nonproliferative diabetic retinopathy without macular edema, bilateral: Secondary | ICD-10-CM | POA: Diagnosis not present

## 2019-02-07 DIAGNOSIS — Z170 Estrogen receptor positive status [ER+]: Secondary | ICD-10-CM | POA: Diagnosis not present

## 2019-03-08 ENCOUNTER — Other Ambulatory Visit (HOSPITAL_COMMUNITY): Payer: Self-pay | Admitting: Hematology

## 2019-03-11 DIAGNOSIS — I10 Essential (primary) hypertension: Secondary | ICD-10-CM | POA: Diagnosis not present

## 2019-03-11 DIAGNOSIS — J45909 Unspecified asthma, uncomplicated: Secondary | ICD-10-CM | POA: Diagnosis not present

## 2019-03-11 DIAGNOSIS — J479 Bronchiectasis, uncomplicated: Secondary | ICD-10-CM | POA: Diagnosis not present

## 2019-03-11 DIAGNOSIS — E119 Type 2 diabetes mellitus without complications: Secondary | ICD-10-CM | POA: Diagnosis not present

## 2019-05-09 DIAGNOSIS — H26493 Other secondary cataract, bilateral: Secondary | ICD-10-CM | POA: Diagnosis not present

## 2019-05-09 DIAGNOSIS — H43813 Vitreous degeneration, bilateral: Secondary | ICD-10-CM | POA: Diagnosis not present

## 2019-06-12 DIAGNOSIS — E119 Type 2 diabetes mellitus without complications: Secondary | ICD-10-CM | POA: Diagnosis not present

## 2019-06-12 DIAGNOSIS — J479 Bronchiectasis, uncomplicated: Secondary | ICD-10-CM | POA: Diagnosis not present

## 2019-06-12 DIAGNOSIS — I10 Essential (primary) hypertension: Secondary | ICD-10-CM | POA: Diagnosis not present

## 2019-08-14 DIAGNOSIS — I129 Hypertensive chronic kidney disease with stage 1 through stage 4 chronic kidney disease, or unspecified chronic kidney disease: Secondary | ICD-10-CM | POA: Diagnosis not present

## 2019-08-14 DIAGNOSIS — C50911 Malignant neoplasm of unspecified site of right female breast: Secondary | ICD-10-CM | POA: Diagnosis not present

## 2019-08-14 DIAGNOSIS — N183 Chronic kidney disease, stage 3 (moderate): Secondary | ICD-10-CM | POA: Diagnosis not present

## 2019-08-14 DIAGNOSIS — M810 Age-related osteoporosis without current pathological fracture: Secondary | ICD-10-CM | POA: Diagnosis not present

## 2019-08-14 DIAGNOSIS — E1121 Type 2 diabetes mellitus with diabetic nephropathy: Secondary | ICD-10-CM | POA: Diagnosis not present

## 2019-08-14 DIAGNOSIS — J45909 Unspecified asthma, uncomplicated: Secondary | ICD-10-CM | POA: Diagnosis not present

## 2019-08-14 DIAGNOSIS — J479 Bronchiectasis, uncomplicated: Secondary | ICD-10-CM | POA: Diagnosis not present

## 2019-08-20 ENCOUNTER — Other Ambulatory Visit: Payer: Self-pay

## 2019-08-20 ENCOUNTER — Inpatient Hospital Stay (HOSPITAL_COMMUNITY): Payer: Medicare Other | Attending: Hematology

## 2019-08-20 DIAGNOSIS — C50211 Malignant neoplasm of upper-inner quadrant of right female breast: Secondary | ICD-10-CM | POA: Insufficient documentation

## 2019-08-20 DIAGNOSIS — I10 Essential (primary) hypertension: Secondary | ICD-10-CM | POA: Insufficient documentation

## 2019-08-20 DIAGNOSIS — Z79811 Long term (current) use of aromatase inhibitors: Secondary | ICD-10-CM | POA: Diagnosis not present

## 2019-08-20 DIAGNOSIS — M818 Other osteoporosis without current pathological fracture: Secondary | ICD-10-CM | POA: Insufficient documentation

## 2019-08-20 DIAGNOSIS — E538 Deficiency of other specified B group vitamins: Secondary | ICD-10-CM | POA: Insufficient documentation

## 2019-08-20 DIAGNOSIS — E119 Type 2 diabetes mellitus without complications: Secondary | ICD-10-CM | POA: Insufficient documentation

## 2019-08-20 DIAGNOSIS — Z79899 Other long term (current) drug therapy: Secondary | ICD-10-CM | POA: Diagnosis not present

## 2019-08-20 DIAGNOSIS — Z170 Estrogen receptor positive status [ER+]: Secondary | ICD-10-CM

## 2019-08-20 LAB — COMPREHENSIVE METABOLIC PANEL
ALT: 15 U/L (ref 0–44)
AST: 19 U/L (ref 15–41)
Albumin: 3.7 g/dL (ref 3.5–5.0)
Alkaline Phosphatase: 63 U/L (ref 38–126)
Anion gap: 8 (ref 5–15)
BUN: 13 mg/dL (ref 8–23)
CO2: 25 mmol/L (ref 22–32)
Calcium: 8.8 mg/dL — ABNORMAL LOW (ref 8.9–10.3)
Chloride: 105 mmol/L (ref 98–111)
Creatinine, Ser: 0.82 mg/dL (ref 0.44–1.00)
GFR calc Af Amer: >60 mL/min (ref >60)
GFR calc non Af Amer: >60 mL/min (ref >60)
Glucose, Bld: 171 mg/dL — ABNORMAL HIGH (ref 70–99)
Potassium: 4.2 mmol/L (ref 3.5–5.1)
Sodium: 138 mmol/L (ref 135–145)
Total Bilirubin: 1 mg/dL (ref 0.3–1.2)
Total Protein: 6 g/dL — ABNORMAL LOW (ref 6.5–8.1)

## 2019-08-20 LAB — CBC WITH DIFFERENTIAL/PLATELET
Abs Immature Granulocytes: 0.13 10*3/uL — ABNORMAL HIGH (ref 0.00–0.07)
Basophils Absolute: 0.1 10*3/uL (ref 0.0–0.1)
Basophils Relative: 1 %
Eosinophils Absolute: 0.6 10*3/uL — ABNORMAL HIGH (ref 0.0–0.5)
Eosinophils Relative: 6 %
HCT: 38.6 % (ref 36.0–46.0)
Hemoglobin: 12.4 g/dL (ref 12.0–15.0)
Immature Granulocytes: 2 %
Lymphocytes Relative: 28 %
Lymphs Abs: 2.4 10*3/uL (ref 0.7–4.0)
MCH: 30.4 pg (ref 26.0–34.0)
MCHC: 32.1 g/dL (ref 30.0–36.0)
MCV: 94.6 fL (ref 80.0–100.0)
Monocytes Absolute: 0.6 10*3/uL (ref 0.1–1.0)
Monocytes Relative: 7 %
Neutro Abs: 5 10*3/uL (ref 1.7–7.7)
Neutrophils Relative %: 56 %
Platelets: UNDETERMINED 10*3/uL (ref 150–400)
RBC: 4.08 MIL/uL (ref 3.87–5.11)
RDW: 13.1 % (ref 11.5–15.5)
WBC: 8.8 10*3/uL (ref 4.0–10.5)
nRBC: 0 % (ref 0.0–0.2)

## 2019-08-20 LAB — VITAMIN B12: Vitamin B-12: 185 pg/mL (ref 180–914)

## 2019-08-20 LAB — LACTATE DEHYDROGENASE: LDH: 127 U/L (ref 98–192)

## 2019-08-20 LAB — FOLATE: Folate: 61.2 ng/mL (ref >5.9)

## 2019-08-20 LAB — VITAMIN D 25 HYDROXY (VIT D DEFICIENCY, FRACTURES): Vit D, 25-Hydroxy: 77.34 ng/mL (ref 30–100)

## 2019-08-21 ENCOUNTER — Ambulatory Visit (HOSPITAL_COMMUNITY): Payer: Medicare Other | Admitting: Nurse Practitioner

## 2019-08-21 ENCOUNTER — Ambulatory Visit (HOSPITAL_COMMUNITY): Payer: Medicare Other

## 2019-08-23 ENCOUNTER — Inpatient Hospital Stay (HOSPITAL_COMMUNITY): Payer: Medicare Other

## 2019-08-23 ENCOUNTER — Encounter (HOSPITAL_COMMUNITY): Payer: Self-pay | Admitting: Nurse Practitioner

## 2019-08-23 ENCOUNTER — Other Ambulatory Visit: Payer: Self-pay

## 2019-08-23 ENCOUNTER — Inpatient Hospital Stay (HOSPITAL_BASED_OUTPATIENT_CLINIC_OR_DEPARTMENT_OTHER): Payer: Medicare Other | Admitting: Nurse Practitioner

## 2019-08-23 DIAGNOSIS — E7800 Pure hypercholesterolemia, unspecified: Secondary | ICD-10-CM | POA: Diagnosis not present

## 2019-08-23 DIAGNOSIS — E119 Type 2 diabetes mellitus without complications: Secondary | ICD-10-CM | POA: Diagnosis not present

## 2019-08-23 DIAGNOSIS — M810 Age-related osteoporosis without current pathological fracture: Secondary | ICD-10-CM

## 2019-08-23 DIAGNOSIS — Z170 Estrogen receptor positive status [ER+]: Secondary | ICD-10-CM | POA: Diagnosis not present

## 2019-08-23 DIAGNOSIS — M818 Other osteoporosis without current pathological fracture: Secondary | ICD-10-CM | POA: Diagnosis not present

## 2019-08-23 DIAGNOSIS — C50211 Malignant neoplasm of upper-inner quadrant of right female breast: Secondary | ICD-10-CM

## 2019-08-23 DIAGNOSIS — E1121 Type 2 diabetes mellitus with diabetic nephropathy: Secondary | ICD-10-CM | POA: Diagnosis not present

## 2019-08-23 DIAGNOSIS — I129 Hypertensive chronic kidney disease with stage 1 through stage 4 chronic kidney disease, or unspecified chronic kidney disease: Secondary | ICD-10-CM | POA: Diagnosis not present

## 2019-08-23 DIAGNOSIS — E538 Deficiency of other specified B group vitamins: Secondary | ICD-10-CM | POA: Diagnosis not present

## 2019-08-23 DIAGNOSIS — I10 Essential (primary) hypertension: Secondary | ICD-10-CM | POA: Diagnosis not present

## 2019-08-23 DIAGNOSIS — J479 Bronchiectasis, uncomplicated: Secondary | ICD-10-CM | POA: Diagnosis not present

## 2019-08-23 DIAGNOSIS — J45909 Unspecified asthma, uncomplicated: Secondary | ICD-10-CM | POA: Diagnosis not present

## 2019-08-23 DIAGNOSIS — Z79811 Long term (current) use of aromatase inhibitors: Secondary | ICD-10-CM | POA: Diagnosis not present

## 2019-08-23 DIAGNOSIS — C50411 Malignant neoplasm of upper-outer quadrant of right female breast: Secondary | ICD-10-CM | POA: Diagnosis not present

## 2019-08-23 MED ORDER — CYANOCOBALAMIN 1000 MCG/ML IJ SOLN
1000.0000 ug | Freq: Once | INTRAMUSCULAR | Status: AC
  Administered 2019-08-23: 1000 ug via INTRAMUSCULAR

## 2019-08-23 MED ORDER — CYANOCOBALAMIN 1000 MCG/ML IJ SOLN
INTRAMUSCULAR | Status: AC
  Filled 2019-08-23: qty 1

## 2019-08-23 MED ORDER — DENOSUMAB 60 MG/ML ~~LOC~~ SOSY
PREFILLED_SYRINGE | SUBCUTANEOUS | Status: AC
  Filled 2019-08-23: qty 1

## 2019-08-23 MED ORDER — DENOSUMAB 60 MG/ML ~~LOC~~ SOSY
60.0000 mg | PREFILLED_SYRINGE | Freq: Once | SUBCUTANEOUS | Status: AC
  Administered 2019-08-23: 60 mg via SUBCUTANEOUS

## 2019-08-23 NOTE — Progress Notes (Signed)
[facility] [place], [place] [address]   CLINIC:  Medical Oncology/Hematology  PCP:  Lajean Manes, MD 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 Need additional imaging evaluation and/or prior mammograms for comparison.   09/15/2015 Imaging   CT chest- Right lower lobe 4 mm solid pulmonary nodule. If the patient is at high risk for bronchogenic carcinoma, follow-up chest CT at 1 year is recommended.   09/16/2015 Breast US   US showing a small hypoechoic irregular mass with hyperechoic rim in the 1 o'clock location of R breast 10 cm from the nipple. Mass measures 0.7 x 0.5 x 0.6 cm. 2nd mass is identified in the 1 o'clock location 9 cm from the nipple 0.7 x 0.7 x 0.6 cm   09/16/2015 Mammogram   Two suspicious masses in the 1 o'clock location of the right breast warranting tissue diagnosis.  No evidence for adenopathy.   09/21/2015 Pathology Results   Estrogen Receptor: 100%, POSITIVE, STRONG STAINING INTENSITY Progesterone Receptor: 10%, POSITIVE, MODERATE STAINING INTENSITY Proliferation Marker Ki67: 10%.  HER2 - **POSITIVE**   09/21/2015 Mammogram   Appropriate positioning of the 2 biopsy marking clips in the upper-inner quadrant of the right breast at 1 o'clock.   09/21/2015 Initial Biopsy   Breast, right, needle core biopsy, 1:00 o'clock, 9 CMFN - INVASIVE DUCTAL CARCINOMA. - DUCTAL CARCINOMA IN SITU. - SEE COMMENT. 2. Breast, right, needle core biopsy, 1:00 o'clock, 10 CMFN - INVASIVE DUCTAL CARCINOMA. - DUCTAL CARCINOMA IN SITU WITH    10/26/2015 Procedure   Right chest porta cath placed with no adverse features.- Dr. Brantley Stage.   11/03/2015  Pathology Results   MULTIFOCAL INVASIVE DUCTAL CARCINOMA.  INVASIVE TUMOR IS 1.0 MM FROM NEAREST MARGIN (INFERIOR).  HIGH GRADE DUCTAL CARCINOMA IN SITU WITH NECROSIS.  IN SITU CARCINOMA IS 0.1 MM FROM THE NEAREST MARGIN (INFERIOR MARGIN).   11/03/2015 Procedure   Right lumpectomy by Dr. Brantley Stage.   11/17/2015 Initial Diagnosis   Breast cancer of upper-inner quadrant of right female breast ([place])   11/21/2015 Imaging   Normal left ventricular ejection fraction equal to 71.9%.   11/26/2015 -  Chemotherapy   Paclitaxel weekly x 12    11/26/2015 - 11/15/2016 Antibody Plan   Herceptin weekly with Paclitaxel.   02/15/2016 Echocardiogram   MUGA- Normal LEFT ventricular ejection fraction 65% slightly decreased from 72% on the previous exam.    04/18/2016 Imaging   MUGA- Normal left ventricular ejection fraction equal to 70.1%. This has increased from 65.2% previously.    07/26/2016 Echocardiogram   MUGA Left ventricular ejection fraction equals 73 %   10/27/2016 Imaging   MUGA- Normal LEFT ventricular ejection fraction of 68%, minimally decreased in a 73% on the previous exam.  Normal LEFT ventricular wall motion.   11/15/2016 -  Adjuvant Chemotherapy   Completed 52 weeks of Herceptin.   11/29/2016 Breast US   Ultrasound-guided biopsy of the mass in the right breast at the 11:30 position 3 cm from nipple performed and surgical path demonstrated fibrosis consistent with scar, no malignancy.   03/22/2017 Procedure   Chemoport removal by Dr. Arnoldo Morale      CANCER STAGING: Cancer Staging Breast cancer of upper-inner quadrant  of right female breast [facility]) Staging form: Breast, AJCC 7th Edition - Pathologic stage from 12/09/2015: Stage IA (T1c, N0, cM0) - Signed by Baird Cancer, PA-C on 12/09/2015    INTERVAL HISTORY:  Ms. Mirkin 78 y.o. female returns for routine follow-up for triple positive right breast cancer.  Patient reports she has been doing well since her last visit she has no complaints  at this time. Denies any nausea, vomiting, or diarrhea. Denies any new pains. Had not noticed any recent bleeding such as epistaxis, hematuria or hematochezia. Denies recent chest pain on exertion, shortness of breath on minimal exertion, pre-syncopal episodes, or palpitations. Denies any numbness or tingling in hands or feet. Denies any recent fevers, infections, or recent hospitalizations. Patient reports appetite at 100% and energy level at 50%.  She is eating well maintaining her weight at this time.    REVIEW OF SYSTEMS:  Review of Systems  All other systems reviewed and are negative.    PAST MEDICAL/SURGICAL HISTORY:  Past Medical History:  Diagnosis Date  . Asthma   . Breast cancer ([place])    Right breast  . Cancer ([place]) 2016   right breast  . Diabetes mellitus without complication ([place])   . GERD (gastroesophageal reflux disease)   . Hypertension   . Radiation 03/23/16-05/09/16   45 Gy to right breast, boosted to 16 Gy   Past Surgical History:  Procedure Laterality Date  . ABDOMINAL HYSTERECTOMY  1983  . BREAST LUMPECTOMY Right   . BREAST LUMPECTOMY WITH RADIOACTIVE SEED AND SENTINEL LYMPH NODE BIOPSY Right 11/03/2015   Procedure: RIGHT BREAST LUMPECTOMY WITH RADIOACTIVE SEED AND SENTINEL LYMPH NODE MAPPING;  Surgeon: Erroll Luna, MD;  Location: [facility];  Service: General;  Laterality: Right;  . NASAL FRACTURE SURGERY    . PORT-A-CATH REMOVAL Left 03/22/2017   Procedure: MINOR REMOVAL PORT-A-CATH;  Surgeon: Aviva Signs, MD;  Location: [facility];  Service: General;  Laterality: Left;  . PORTACATH PLACEMENT Right 11/03/2015   Procedure: INSERTION PORT-A-CATH;  Surgeon: Erroll Luna, MD;  Location: [facility];  Service: General;  Laterality: Right;     SOCIAL HISTORY:  Social History   Socioeconomic History  . Marital status: Married    Spouse name: Not on file  . Number of children: Not on file  . Years of education: Not on file  .  Highest education level: Not on file  Occupational History  . Occupation: retired  Scientific laboratory technician  . Financial resource strain: Not on file  . Food insecurity    Worry: Not on file    Inability: Not on file  . Transportation needs    Medical: Not on file    Non-medical: Not on file  Tobacco Use  . Smoking status: Never Smoker  . Smokeless tobacco: Never Used  Substance and Sexual Activity  . Alcohol use: No    Alcohol/week: 0.0 standard drinks  . Drug use: No  . Sexual activity: Never    Birth control/protection: Surgical  Lifestyle  . Physical activity    Days per week: Not on file    Minutes per session: Not on file  . Stress: Not on file  Relationships  . Social Herbalist on phone: Not on file    Gets together: Not on file    Attends religious service: Not on file    Active member of club or organization: Not on file    Attends meetings of clubs or organizations: Not  on file    Relationship status: Not on file  . Intimate partner violence    Fear of current or ex partner: Not on file    Emotionally abused: Not on file    Physically abused: Not on file    Forced sexual activity: Not on file  Other Topics Concern  . Not on file  Social History Narrative  . Not on file    FAMILY HISTORY:  Family History  Problem Relation Age of Onset  . Breast cancer Mother   . Leukemia Father     CURRENT MEDICATIONS:  Outpatient Encounter Medications as of 08/23/2019  Medication Sig Note  . amLODipine (NORVASC) 5 MG tablet Take 5 mg by mouth daily.   Marland Kitchen anastrozole (ARIMIDEX) 1 MG tablet TAKE 1 TABLET BY MOUTH EVERY DAY   . atorvastatin (LIPITOR) 10 MG tablet Take 10 mg by mouth daily.   . Calcium-Vitamin D-Vitamin K (VIACTIV) 409-811-91 MG-UNT-MCG CHEW Chew 1 each by mouth daily.   Marland Kitchen dexamethasone (DECADRON) 4 MG tablet TAKE 2 TABLETS WEEKLY 03/16/2017: Wednesdays  . famotidine (PEPCID) 40 MG tablet Take 40 mg by mouth daily.   Marland Kitchen losartan (COZAAR) 25 MG tablet Take  25 mg by mouth daily.   . metFORMIN (GLUCOPHAGE-XR) 500 MG 24 hr tablet TAKE 2 TABLETS BY MOUTH ONCE A DAY WITH A MEAL   . omeprazole (PRILOSEC) 40 MG capsule Take 40 mg by mouth daily.   . SYMBICORT 160-4.5 MCG/ACT inhaler Inhale 2 puffs into the lungs 2 (two) times daily.   . [DISCONTINUED] Cholecalciferol (VITAMIN D3) 2000 units TABS Take 1 tablet by mouth daily.    Marland Kitchen albuterol (PROAIR HFA) 108 (90 BASE) MCG/ACT inhaler Inhale 2 puffs into the lungs every 6 (six) hours as needed for wheezing or shortness of breath.    No facility-administered encounter medications on file as of 08/23/2019.     ALLERGIES:  Allergies  Allergen Reactions  . Lisinopril Cough  . Losartan Hives    Questionable losartan vs lidocaine reaction  . Penicillins Rash     PHYSICAL EXAM:  ECOG Performance status: 1  Vitals:   08/23/19 1103  BP: 140/76  Pulse: 80  Resp: 18  Temp: (!) 96.6 F (35.9 C)  SpO2: 100%   Filed Weights   08/23/19 1103  Weight: 106 lb 1.6 oz (48.1 kg)    Physical Exam Constitutional:      Appearance: Normal appearance. She is normal weight.  Cardiovascular:     Rate and Rhythm: Normal rate and regular rhythm.     Heart sounds: Normal heart sounds.  Pulmonary:     Effort: Pulmonary effort is normal.     Breath sounds: Normal breath sounds.  Abdominal:     General: Bowel sounds are normal.     Palpations: Abdomen is soft.  Musculoskeletal: Normal range of motion.  Skin:    General: Skin is warm and dry.  Neurological:     Mental Status: She is alert and oriented to person, place, and time. Mental status is at baseline.  Psychiatric:        Mood and Affect: Mood normal.        Behavior: Behavior normal.        Thought Content: Thought content normal.        Judgment: Judgment normal.      LABORATORY DATA:  I have reviewed the labs as listed.  CBC    Component Value Date/Time   WBC 8.8 08/20/2019 1019  RBC 4.08 08/20/2019 1019   HGB 12.4 08/20/2019 1019    HCT 38.6 08/20/2019 1019   PLT PLATELET CLUMPS NOTED ON SMEAR, UNABLE TO ESTIMATE 08/20/2019 1019   MCV 94.6 08/20/2019 1019   MCH 30.4 08/20/2019 1019   MCHC 32.1 08/20/2019 1019   RDW 13.1 08/20/2019 1019   LYMPHSABS 2.4 08/20/2019 1019   MONOABS 0.6 08/20/2019 1019   EOSABS 0.6 (H) 08/20/2019 1019   BASOSABS 0.1 08/20/2019 1019   CMP Latest Ref Rng & Units 08/20/2019 02/05/2019 07/10/2018  Glucose 70 - 99 mg/dL 171(H) 273(H) 119(H)  BUN 8 - 23 mg/dL '13 13 13  '$ Creatinine 0.44 - 1.00 mg/dL 0.82 0.99 1.11(H)  Sodium 135 - 145 mmol/L 138 139 142  Potassium 3.5 - 5.1 mmol/L 4.2 3.7 4.2  Chloride 98 - 111 mmol/L 105 104 109  CO2 22 - 32 mmol/L '25 26 26  '$ Calcium 8.9 - 10.3 mg/dL 8.8(L) 9.0 9.0  Total Protein 6.5 - 8.1 g/dL 6.0(L) 6.1(L) 6.4(L)  Total Bilirubin 0.3 - 1.2 mg/dL 1.0 0.6 1.1  Alkaline Phos 38 - 126 U/L 63 52 54  AST 15 - 41 U/L '19 19 18  '$ ALT 0 - 44 U/L '15 15 14    '$ I personally performed a face-to-face visit.  All questions were answered to patient's stated satisfaction. Encouraged patient to call with any new concerns or questions before his next visit to the cancer center and we can certain see him sooner, if needed.     ASSESSMENT & PLAN:   Breast cancer of upper-inner quadrant of right female breast ([place]) 1.  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 -Anastrozole switched to tamoxifen in January 2019 secondary to osteoporosis. -Tamoxifen was tolerated well but self discontinued by the patient due to vaginal discharge. - Anastrozole was restarted on 03/14/2018. -Today's examination did not reveal any palpable masses. - Her latest mammogram on 02/05/2019 was BI-RADS category 2 benign. - She will follow-up in 6 months with labs AND MAMMOGRAM.  2. Osteoporosis: - Patient was started on Prolia 60 mg every 6 months. -She started Prolia January 2019.  -She will get her injection today on 08/23/2019. -She is also taking calcium 3 times a day.  And vitamin D3 2 times a day. -Her last DEXA scan was on 06/2016 which showed evidence of osteoporosis - We will repeat a DEXA scan 18 months after therapy.  3.  Lung nodule: - CT scan on 02/05/2019 showed unchanged 4 mm pulmonary nodule on the right lower lobe, stable for greater than 2 years and benign.   -No routine CT follow-up is recommended for this nodule.  4.  Vitamin B12 deficiency: -Labs on 08/20/2019 showed her vitamin B12 level at 185. -We will give her an injection of B12 today. -We will start her on oral B12 daily. -We will check her levels again in 6 months.      Orders placed this encounter:  Orders Placed This Encounter  Procedures  . MM DIAG BREAST TOMO BILATERAL  . Lactate dehydrogenase  . CBC with Differential/Platelet  . Comprehensive metabolic panel  . Vitamin B12  . VITAMIN D 25 Hydroxy (Vit-D Deficiency, Fractures)      Francene Finders, FNP-C [place] [phone]

## 2019-08-23 NOTE — Assessment & Plan Note (Addendum)
1.  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 -Anastrozole switched to tamoxifen in January 2019 secondary to osteoporosis. -Tamoxifen was tolerated well but self discontinued by the patient due to vaginal discharge. - Anastrozole was restarted on 03/14/2018. -Today's examination did not reveal any palpable masses. - Her latest mammogram on 02/05/2019 was BI-RADS category 2 benign. - She will follow-up in 6 months with labs AND MAMMOGRAM.  2. Osteoporosis: - Patient was started on Prolia 60 mg every 6 months. -She started Prolia January 2019. -She will get her injection today on 08/23/2019. -She is also taking calcium 3 times a day.  And vitamin D3 2 times a day. -Her last DEXA scan was on 06/2016 which showed evidence of osteoporosis - We will repeat a DEXA scan 18 months after therapy.  3.  Lung nodule: - CT scan on 02/05/2019 showed unchanged 4 mm pulmonary nodule on the right lower lobe, stable for greater than 2 years and benign.   -No routine CT follow-up is recommended for this nodule.  4.  Vitamin B12 deficiency: -Labs on 08/20/2019 showed her vitamin B12 level at 185. -We will give her an injection of B12 today. -We will start her on oral B12 daily. -We will check her levels again in 6 months.

## 2019-08-23 NOTE — Patient Instructions (Signed)
[facility] at [facility] Discharge Instructions  Follow up in 6 months with labs and mammogram   Thank you for choosing [facility] at [facility] to provide your oncology and hematology care.  To afford each patient quality time with our provider, please arrive at least 15 minutes before your scheduled appointment time.   If you have a lab appointment with the Cancer Center please come in thru the Main Entrance and check in at the main information desk.  You need to re-schedule your appointment should you arrive 10 or more minutes late.  We strive to give you quality time with our providers, and arriving late affects you and other patients whose appointments are after yours.  Also, if you no show three or more times for appointments you may be dismissed from the clinic at the providers discretion.     Again, thank you for choosing [facility].  Our hope is that these requests will decrease the amount of time that you wait before being seen by our physicians.       _____________________________________________________________  Should you have questions after your visit to [facility], please contact our office at [phone] between the hours of 8:00 a.m. and 4:30 p.m.  Voicemails left after 4:00 p.m. will not be returned until the following business day.  For prescription refill requests, have your pharmacy contact our office and allow 72 hours.    Due to Covid, you will need to wear a mask upon entering the hospital. If you do not have a mask, a mask will be given to you at the Main Entrance upon arrival. For doctor visits, patients may have 1 support person with them. For treatment visits, patients can not have anyone with them due to social distancing guidelines and our immunocompromised population.      

## 2019-09-19 ENCOUNTER — Ambulatory Visit
Admission: RE | Admit: 2019-09-19 | Discharge: 2019-09-19 | Disposition: A | Discharge status: Home / Self Care | Payer: Medicare Other | Source: Ambulatory Visit | Attending: Geriatric Medicine | Admitting: Geriatric Medicine

## 2019-09-19 ENCOUNTER — Other Ambulatory Visit: Payer: Self-pay | Admitting: Geriatric Medicine

## 2019-09-19 DIAGNOSIS — K219 Gastro-esophageal reflux disease without esophagitis: Secondary | ICD-10-CM | POA: Diagnosis not present

## 2019-09-19 DIAGNOSIS — M545 Low back pain, unspecified: Secondary | ICD-10-CM

## 2019-09-19 DIAGNOSIS — Z0000 Encounter for general adult medical examination without abnormal findings: Secondary | ICD-10-CM | POA: Diagnosis not present

## 2019-09-19 DIAGNOSIS — N1831 Chronic kidney disease, stage 3a: Secondary | ICD-10-CM | POA: Diagnosis not present

## 2019-09-19 DIAGNOSIS — I129 Hypertensive chronic kidney disease with stage 1 through stage 4 chronic kidney disease, or unspecified chronic kidney disease: Secondary | ICD-10-CM | POA: Diagnosis not present

## 2019-09-19 DIAGNOSIS — E7800 Pure hypercholesterolemia, unspecified: Secondary | ICD-10-CM | POA: Diagnosis not present

## 2019-09-19 DIAGNOSIS — K912 Postsurgical malabsorption, not elsewhere classified: Secondary | ICD-10-CM | POA: Diagnosis not present

## 2019-09-19 DIAGNOSIS — Z79899 Other long term (current) drug therapy: Secondary | ICD-10-CM | POA: Diagnosis not present

## 2019-09-19 DIAGNOSIS — K9089 Other intestinal malabsorption: Secondary | ICD-10-CM | POA: Diagnosis not present

## 2019-09-19 DIAGNOSIS — Z23 Encounter for immunization: Secondary | ICD-10-CM | POA: Diagnosis not present

## 2019-09-19 DIAGNOSIS — E1121 Type 2 diabetes mellitus with diabetic nephropathy: Secondary | ICD-10-CM | POA: Diagnosis not present

## 2019-09-19 DIAGNOSIS — E113293 Type 2 diabetes mellitus with mild nonproliferative diabetic retinopathy without macular edema, bilateral: Secondary | ICD-10-CM | POA: Diagnosis not present

## 2019-09-19 DIAGNOSIS — Z1389 Encounter for screening for other disorder: Secondary | ICD-10-CM | POA: Diagnosis not present

## 2019-09-19 DIAGNOSIS — I700 Atherosclerosis of aorta: Secondary | ICD-10-CM | POA: Diagnosis not present

## 2019-09-19 DIAGNOSIS — J479 Bronchiectasis, uncomplicated: Secondary | ICD-10-CM | POA: Diagnosis not present

## 2019-09-19 DIAGNOSIS — C50411 Malignant neoplasm of upper-outer quadrant of right female breast: Secondary | ICD-10-CM | POA: Diagnosis not present

## 2019-09-24 DIAGNOSIS — I10 Essential (primary) hypertension: Secondary | ICD-10-CM | POA: Diagnosis not present

## 2019-09-24 DIAGNOSIS — J479 Bronchiectasis, uncomplicated: Secondary | ICD-10-CM | POA: Diagnosis not present

## 2019-09-24 DIAGNOSIS — K2100 Gastro-esophageal reflux disease with esophagitis, without bleeding: Secondary | ICD-10-CM | POA: Diagnosis not present

## 2019-10-29 ENCOUNTER — Other Ambulatory Visit (HOSPITAL_COMMUNITY): Payer: Self-pay | Admitting: Hematology

## 2019-11-14 DIAGNOSIS — E7800 Pure hypercholesterolemia, unspecified: Secondary | ICD-10-CM | POA: Diagnosis not present

## 2019-11-14 DIAGNOSIS — E1121 Type 2 diabetes mellitus with diabetic nephropathy: Secondary | ICD-10-CM | POA: Diagnosis not present

## 2019-11-14 DIAGNOSIS — I129 Hypertensive chronic kidney disease with stage 1 through stage 4 chronic kidney disease, or unspecified chronic kidney disease: Secondary | ICD-10-CM | POA: Diagnosis not present

## 2019-11-14 DIAGNOSIS — C50211 Malignant neoplasm of upper-inner quadrant of right female breast: Secondary | ICD-10-CM | POA: Diagnosis not present

## 2019-11-14 DIAGNOSIS — C50411 Malignant neoplasm of upper-outer quadrant of right female breast: Secondary | ICD-10-CM | POA: Diagnosis not present

## 2019-11-14 DIAGNOSIS — M810 Age-related osteoporosis without current pathological fracture: Secondary | ICD-10-CM | POA: Diagnosis not present

## 2019-11-14 DIAGNOSIS — J45909 Unspecified asthma, uncomplicated: Secondary | ICD-10-CM | POA: Diagnosis not present

## 2019-11-14 DIAGNOSIS — J479 Bronchiectasis, uncomplicated: Secondary | ICD-10-CM | POA: Diagnosis not present

## 2019-11-22 DIAGNOSIS — J479 Bronchiectasis, uncomplicated: Secondary | ICD-10-CM | POA: Diagnosis not present

## 2019-11-22 DIAGNOSIS — M810 Age-related osteoporosis without current pathological fracture: Secondary | ICD-10-CM | POA: Diagnosis not present

## 2019-11-22 DIAGNOSIS — C50411 Malignant neoplasm of upper-outer quadrant of right female breast: Secondary | ICD-10-CM | POA: Diagnosis not present

## 2019-11-22 DIAGNOSIS — C50211 Malignant neoplasm of upper-inner quadrant of right female breast: Secondary | ICD-10-CM | POA: Diagnosis not present

## 2019-11-22 DIAGNOSIS — E7800 Pure hypercholesterolemia, unspecified: Secondary | ICD-10-CM | POA: Diagnosis not present

## 2019-11-22 DIAGNOSIS — I129 Hypertensive chronic kidney disease with stage 1 through stage 4 chronic kidney disease, or unspecified chronic kidney disease: Secondary | ICD-10-CM | POA: Diagnosis not present

## 2019-11-22 DIAGNOSIS — J45909 Unspecified asthma, uncomplicated: Secondary | ICD-10-CM | POA: Diagnosis not present

## 2019-11-22 DIAGNOSIS — E1121 Type 2 diabetes mellitus with diabetic nephropathy: Secondary | ICD-10-CM | POA: Diagnosis not present

## 2019-12-20 DIAGNOSIS — J45909 Unspecified asthma, uncomplicated: Secondary | ICD-10-CM | POA: Diagnosis not present

## 2019-12-20 DIAGNOSIS — C50411 Malignant neoplasm of upper-outer quadrant of right female breast: Secondary | ICD-10-CM | POA: Diagnosis not present

## 2019-12-20 DIAGNOSIS — E7800 Pure hypercholesterolemia, unspecified: Secondary | ICD-10-CM | POA: Diagnosis not present

## 2019-12-20 DIAGNOSIS — C50211 Malignant neoplasm of upper-inner quadrant of right female breast: Secondary | ICD-10-CM | POA: Diagnosis not present

## 2019-12-20 DIAGNOSIS — E1121 Type 2 diabetes mellitus with diabetic nephropathy: Secondary | ICD-10-CM | POA: Diagnosis not present

## 2019-12-20 DIAGNOSIS — M810 Age-related osteoporosis without current pathological fracture: Secondary | ICD-10-CM | POA: Diagnosis not present

## 2019-12-20 DIAGNOSIS — I129 Hypertensive chronic kidney disease with stage 1 through stage 4 chronic kidney disease, or unspecified chronic kidney disease: Secondary | ICD-10-CM | POA: Diagnosis not present

## 2019-12-20 DIAGNOSIS — J479 Bronchiectasis, uncomplicated: Secondary | ICD-10-CM | POA: Diagnosis not present

## 2020-01-01 DIAGNOSIS — H26492 Other secondary cataract, left eye: Secondary | ICD-10-CM | POA: Diagnosis not present

## 2020-01-21 ENCOUNTER — Other Ambulatory Visit (HOSPITAL_COMMUNITY): Payer: Self-pay | Admitting: Nurse Practitioner

## 2020-01-21 DIAGNOSIS — E7800 Pure hypercholesterolemia, unspecified: Secondary | ICD-10-CM | POA: Diagnosis not present

## 2020-01-21 DIAGNOSIS — Z9889 Other specified postprocedural states: Secondary | ICD-10-CM

## 2020-01-21 DIAGNOSIS — I129 Hypertensive chronic kidney disease with stage 1 through stage 4 chronic kidney disease, or unspecified chronic kidney disease: Secondary | ICD-10-CM | POA: Diagnosis not present

## 2020-01-21 DIAGNOSIS — Z7984 Long term (current) use of oral hypoglycemic drugs: Secondary | ICD-10-CM | POA: Diagnosis not present

## 2020-01-21 DIAGNOSIS — J45909 Unspecified asthma, uncomplicated: Secondary | ICD-10-CM | POA: Diagnosis not present

## 2020-01-21 DIAGNOSIS — C50411 Malignant neoplasm of upper-outer quadrant of right female breast: Secondary | ICD-10-CM | POA: Diagnosis not present

## 2020-01-21 DIAGNOSIS — M810 Age-related osteoporosis without current pathological fracture: Secondary | ICD-10-CM | POA: Diagnosis not present

## 2020-01-21 DIAGNOSIS — J479 Bronchiectasis, uncomplicated: Secondary | ICD-10-CM | POA: Diagnosis not present

## 2020-01-21 DIAGNOSIS — C50211 Malignant neoplasm of upper-inner quadrant of right female breast: Secondary | ICD-10-CM | POA: Diagnosis not present

## 2020-01-21 DIAGNOSIS — E1121 Type 2 diabetes mellitus with diabetic nephropathy: Secondary | ICD-10-CM | POA: Diagnosis not present

## 2020-01-21 DIAGNOSIS — N1831 Chronic kidney disease, stage 3a: Secondary | ICD-10-CM | POA: Diagnosis not present

## 2020-02-11 ENCOUNTER — Ambulatory Visit (HOSPITAL_COMMUNITY)
Admission: RE | Admit: 2020-02-11 | Discharge: 2020-02-11 | Disposition: A | Discharge status: Home / Self Care | Payer: Medicare Other | Source: Ambulatory Visit | Attending: Nurse Practitioner | Admitting: Nurse Practitioner

## 2020-02-11 ENCOUNTER — Other Ambulatory Visit: Payer: Self-pay

## 2020-02-11 ENCOUNTER — Encounter (HOSPITAL_COMMUNITY): Payer: Medicare Other

## 2020-02-11 DIAGNOSIS — Z9889 Other specified postprocedural states: Secondary | ICD-10-CM | POA: Insufficient documentation

## 2020-02-11 DIAGNOSIS — R922 Inconclusive mammogram: Secondary | ICD-10-CM | POA: Diagnosis not present

## 2020-02-11 DIAGNOSIS — C50211 Malignant neoplasm of upper-inner quadrant of right female breast: Secondary | ICD-10-CM

## 2020-02-11 DIAGNOSIS — Z170 Estrogen receptor positive status [ER+]: Secondary | ICD-10-CM | POA: Insufficient documentation

## 2020-02-18 ENCOUNTER — Inpatient Hospital Stay (HOSPITAL_COMMUNITY): Payer: Medicare Other | Attending: Hematology

## 2020-02-18 DIAGNOSIS — M810 Age-related osteoporosis without current pathological fracture: Secondary | ICD-10-CM | POA: Insufficient documentation

## 2020-02-18 DIAGNOSIS — R911 Solitary pulmonary nodule: Secondary | ICD-10-CM | POA: Insufficient documentation

## 2020-02-18 DIAGNOSIS — Z170 Estrogen receptor positive status [ER+]: Secondary | ICD-10-CM | POA: Insufficient documentation

## 2020-02-18 DIAGNOSIS — C50211 Malignant neoplasm of upper-inner quadrant of right female breast: Secondary | ICD-10-CM | POA: Insufficient documentation

## 2020-02-18 DIAGNOSIS — E538 Deficiency of other specified B group vitamins: Secondary | ICD-10-CM | POA: Diagnosis not present

## 2020-02-18 DIAGNOSIS — Z79899 Other long term (current) drug therapy: Secondary | ICD-10-CM | POA: Diagnosis not present

## 2020-02-18 LAB — CBC WITH DIFFERENTIAL/PLATELET
Abs Immature Granulocytes: 0.09 10*3/uL — ABNORMAL HIGH (ref 0.00–0.07)
Basophils Absolute: 0.1 10*3/uL (ref 0.0–0.1)
Basophils Relative: 1 %
Eosinophils Absolute: 0.4 10*3/uL (ref 0.0–0.5)
Eosinophils Relative: 4 %
HCT: 37.4 % (ref 36.0–46.0)
Hemoglobin: 11.8 g/dL — ABNORMAL LOW (ref 12.0–15.0)
Immature Granulocytes: 1 %
Lymphocytes Relative: 24 %
Lymphs Abs: 3 10*3/uL (ref 0.7–4.0)
MCH: 30.3 pg (ref 26.0–34.0)
MCHC: 31.6 g/dL (ref 30.0–36.0)
MCV: 95.9 fL (ref 80.0–100.0)
Monocytes Absolute: 0.9 10*3/uL (ref 0.1–1.0)
Monocytes Relative: 7 %
Neutro Abs: 7.8 10*3/uL — ABNORMAL HIGH (ref 1.7–7.7)
Neutrophils Relative %: 63 %
Platelets: 279 10*3/uL (ref 150–400)
RBC: 3.9 MIL/uL (ref 3.87–5.11)
RDW: 13.4 % (ref 11.5–15.5)
WBC: 12.3 10*3/uL — ABNORMAL HIGH (ref 4.0–10.5)
nRBC: 0 % (ref 0.0–0.2)

## 2020-02-18 LAB — COMPREHENSIVE METABOLIC PANEL
ALT: 13 U/L (ref 0–44)
AST: 18 U/L (ref 15–41)
Albumin: 3.8 g/dL (ref 3.5–5.0)
Alkaline Phosphatase: 49 U/L (ref 38–126)
Anion gap: 10 (ref 5–15)
BUN: 12 mg/dL (ref 8–23)
CO2: 26 mmol/L (ref 22–32)
Calcium: 9.1 mg/dL (ref 8.9–10.3)
Chloride: 102 mmol/L (ref 98–111)
Creatinine, Ser: 1.07 mg/dL — ABNORMAL HIGH (ref 0.44–1.00)
GFR calc Af Amer: 58 mL/min — ABNORMAL LOW (ref >60)
GFR calc non Af Amer: 50 mL/min — ABNORMAL LOW (ref >60)
Glucose, Bld: 120 mg/dL — ABNORMAL HIGH (ref 70–99)
Potassium: 3.6 mmol/L (ref 3.5–5.1)
Sodium: 138 mmol/L (ref 135–145)
Total Bilirubin: 0.8 mg/dL (ref 0.3–1.2)
Total Protein: 6.1 g/dL — ABNORMAL LOW (ref 6.5–8.1)

## 2020-02-18 LAB — VITAMIN D 25 HYDROXY (VIT D DEFICIENCY, FRACTURES): Vit D, 25-Hydroxy: 64.22 ng/mL (ref 30–100)

## 2020-02-18 LAB — VITAMIN B12: Vitamin B-12: 627 pg/mL (ref 180–914)

## 2020-02-18 LAB — LACTATE DEHYDROGENASE: LDH: 114 U/L (ref 98–192)

## 2020-02-21 DIAGNOSIS — I129 Hypertensive chronic kidney disease with stage 1 through stage 4 chronic kidney disease, or unspecified chronic kidney disease: Secondary | ICD-10-CM | POA: Diagnosis not present

## 2020-02-21 DIAGNOSIS — D692 Other nonthrombocytopenic purpura: Secondary | ICD-10-CM | POA: Diagnosis not present

## 2020-02-21 DIAGNOSIS — R911 Solitary pulmonary nodule: Secondary | ICD-10-CM | POA: Diagnosis not present

## 2020-02-21 DIAGNOSIS — I700 Atherosclerosis of aorta: Secondary | ICD-10-CM | POA: Diagnosis not present

## 2020-02-21 DIAGNOSIS — J479 Bronchiectasis, uncomplicated: Secondary | ICD-10-CM | POA: Diagnosis not present

## 2020-02-21 DIAGNOSIS — L723 Sebaceous cyst: Secondary | ICD-10-CM | POA: Diagnosis not present

## 2020-02-21 DIAGNOSIS — N1831 Chronic kidney disease, stage 3a: Secondary | ICD-10-CM | POA: Diagnosis not present

## 2020-02-21 DIAGNOSIS — R634 Abnormal weight loss: Secondary | ICD-10-CM | POA: Diagnosis not present

## 2020-02-21 DIAGNOSIS — E7800 Pure hypercholesterolemia, unspecified: Secondary | ICD-10-CM | POA: Diagnosis not present

## 2020-02-25 ENCOUNTER — Other Ambulatory Visit: Payer: Self-pay

## 2020-02-25 ENCOUNTER — Other Ambulatory Visit (HOSPITAL_COMMUNITY): Payer: Self-pay | Admitting: Nurse Practitioner

## 2020-02-25 ENCOUNTER — Inpatient Hospital Stay (HOSPITAL_COMMUNITY): Payer: Medicare Other

## 2020-02-25 ENCOUNTER — Encounter (HOSPITAL_COMMUNITY): Payer: Self-pay | Admitting: Nurse Practitioner

## 2020-02-25 ENCOUNTER — Inpatient Hospital Stay (HOSPITAL_BASED_OUTPATIENT_CLINIC_OR_DEPARTMENT_OTHER): Payer: Medicare Other | Admitting: Nurse Practitioner

## 2020-02-25 VITALS — BP 142/70 | HR 78 | Temp 96.9°F | Resp 17 | Wt 103.4 lb

## 2020-02-25 DIAGNOSIS — C50211 Malignant neoplasm of upper-inner quadrant of right female breast: Secondary | ICD-10-CM

## 2020-02-25 DIAGNOSIS — Z79899 Other long term (current) drug therapy: Secondary | ICD-10-CM | POA: Diagnosis not present

## 2020-02-25 DIAGNOSIS — Z1231 Encounter for screening mammogram for malignant neoplasm of breast: Secondary | ICD-10-CM

## 2020-02-25 DIAGNOSIS — M810 Age-related osteoporosis without current pathological fracture: Secondary | ICD-10-CM

## 2020-02-25 DIAGNOSIS — R911 Solitary pulmonary nodule: Secondary | ICD-10-CM | POA: Diagnosis not present

## 2020-02-25 DIAGNOSIS — Z170 Estrogen receptor positive status [ER+]: Secondary | ICD-10-CM | POA: Diagnosis not present

## 2020-02-25 DIAGNOSIS — E538 Deficiency of other specified B group vitamins: Secondary | ICD-10-CM | POA: Diagnosis not present

## 2020-02-25 MED ORDER — ANASTROZOLE 1 MG PO TABS: 1.0000 mg | ORAL_TABLET | Freq: Every day | ORAL | 4 refills | Status: DC

## 2020-02-25 MED ORDER — DENOSUMAB 60 MG/ML ~~LOC~~ SOSY
60.0000 mg | PREFILLED_SYRINGE | Freq: Once | SUBCUTANEOUS | Status: AC
  Administered 2020-02-25: 60 mg via SUBCUTANEOUS
  Filled 2020-02-25: qty 1

## 2020-02-25 NOTE — Progress Notes (Signed)
Lori Greene, Lori Greene [id number]   CLINIC:  Medical Oncology/Hematology  PCP:  Lori Manes, MD [address] [place] [address] [phone]   REASON FOR VISIT: Follow-up for breast cancer  CURRENT THERAPY: Anastrozole  BRIEF ONCOLOGIC HISTORY:  Oncology History  Breast cancer of upper-inner quadrant of right female breast ([place])  09/01/2015 Mammogram   BI-RADS CATEGORY 0: Incomplete. Need additional imaging evaluation and/or prior mammograms for comparison.   09/15/2015 Imaging   CT chest- Right lower lobe 4 mm solid pulmonary nodule. If the patient is at high risk for bronchogenic carcinoma, follow-up chest CT at 1 year is recommended.   09/16/2015 Breast US   US showing a small hypoechoic irregular mass with hyperechoic rim in the 1 o'clock location of R breast 10 cm from the nipple. Mass measures 0.7 x 0.5 x 0.6 cm. 2nd mass is identified in the 1 o'clock location 9 cm from the nipple 0.7 x 0.7 x 0.6 cm   09/16/2015 Mammogram   Two suspicious masses in the 1 o'clock location of the right breast warranting tissue diagnosis.  No evidence for adenopathy.   09/21/2015 Pathology Results   Estrogen Receptor: 100%, POSITIVE, STRONG STAINING INTENSITY Progesterone Receptor: 10%, POSITIVE, MODERATE STAINING INTENSITY Proliferation Marker Ki67: 10%.  HER2 - **POSITIVE**   09/21/2015 Mammogram   Appropriate positioning of the 2 biopsy marking clips in the upper-inner quadrant of the right breast at 1 o'clock.   09/21/2015 Initial Biopsy   Breast, right, needle core biopsy, 1:00 o'clock, 9 CMFN - INVASIVE DUCTAL CARCINOMA. - DUCTAL CARCINOMA IN SITU. - SEE COMMENT. 2. Breast, right, needle core biopsy, 1:00 o'clock, 10 CMFN - INVASIVE DUCTAL CARCINOMA. - DUCTAL CARCINOMA IN SITU WITH    10/26/2015 Procedure   Right chest porta cath placed with no adverse features.- Dr. Brantley Stage.   11/03/2015 Pathology Results   MULTIFOCAL INVASIVE  DUCTAL CARCINOMA.  INVASIVE TUMOR IS 1.0 MM FROM NEAREST MARGIN (INFERIOR).  HIGH GRADE DUCTAL CARCINOMA IN SITU WITH NECROSIS.  IN SITU CARCINOMA IS 0.1 MM FROM THE NEAREST MARGIN (INFERIOR MARGIN).   11/03/2015 Procedure   Right lumpectomy by Dr. Brantley Stage.   11/17/2015 Initial Diagnosis   Breast cancer of upper-inner quadrant of right female breast ([place])   11/21/2015 Imaging   Normal left ventricular ejection fraction equal to 71.9%.   11/26/2015 -  Chemotherapy   Paclitaxel weekly x 12    11/26/2015 - 11/15/2016 Antibody Plan   Herceptin weekly with Paclitaxel.   02/15/2016 Echocardiogram   MUGA- Normal LEFT ventricular ejection fraction 65% slightly decreased from 72% on the previous exam.    04/18/2016 Imaging   MUGA- Normal left ventricular ejection fraction equal to 70.1%. This has increased from 65.2% previously.    07/26/2016 Echocardiogram   MUGA Left ventricular ejection fraction equals 73 %   10/27/2016 Imaging   MUGA- Normal LEFT ventricular ejection fraction of 68%, minimally decreased in a 73% on the previous exam.  Normal LEFT ventricular wall motion.   11/15/2016 -  Adjuvant Chemotherapy   Completed 52 weeks of Herceptin.   11/29/2016 Breast US   Ultrasound-guided biopsy of the mass in the right breast at the 11:30 position 3 cm from nipple performed and surgical path demonstrated fibrosis consistent with scar, no malignancy.   03/22/2017 Procedure   Chemoport removal by Dr. Arnoldo Morale      CANCER STAGING: Cancer Staging Breast cancer of upper-inner quadrant of right female breast [facility]) Staging  form: Breast, AJCC 7th Edition - Pathologic stage from 12/09/2015: Stage IA (T1c, N0, cM0) - Signed by Baird Cancer, PA-C on 12/09/2015    INTERVAL HISTORY:  [name] 79 y.o. female returns for routine follow-up for breast cancer.  Patient reports she has been doing well since her last visit.  [place] get a yearly  appointments at this time.  Patient also wishes to switch from Prolia back to Fosamax.  Patient reports she is taking her medication as prescribed with very few side effects.  She does report some occasional hip pain and neck pain. Denies any nausea, vomiting, or diarrhea. Denies any new pains. Had not noticed any recent bleeding such as epistaxis, hematuria or hematochezia. Denies recent chest pain on exertion, shortness of breath on minimal exertion, pre-syncopal episodes, or palpitations. Denies any numbness or tingling in hands or feet. Denies any recent fevers, infections, or recent hospitalizations. Patient reports appetite at 50% and energy level at 25%.  She is eating well maintain her weight at this time.     REVIEW OF SYSTEMS:  Review of Systems  Respiratory: Positive for cough.   Neurological: Positive for headaches and numbness.  Psychiatric/Behavioral: Positive for sleep disturbance.  All other systems reviewed and are negative.    PAST MEDICAL/SURGICAL HISTORY:  Past Medical History:  Diagnosis Date  . Asthma   . Breast cancer ([place])    Right breast  . Cancer ([place]) 2016   right breast  . Diabetes mellitus without complication ([place])   . GERD (gastroesophageal reflux disease)   . Hypertension   . Radiation 03/23/16-05/09/16   45 Gy to right breast, boosted to 16 Gy   Past Surgical History:  Procedure Laterality Date  . ABDOMINAL HYSTERECTOMY  1983  . BREAST LUMPECTOMY Right   . BREAST LUMPECTOMY WITH RADIOACTIVE SEED AND SENTINEL LYMPH NODE BIOPSY Right 11/03/2015   Procedure: RIGHT BREAST LUMPECTOMY WITH RADIOACTIVE SEED AND SENTINEL LYMPH NODE MAPPING;  Surgeon: Erroll Luna, MD;  Location: [facility];  Service: General;  Laterality: Right;  . NASAL FRACTURE SURGERY    . PORT-A-CATH REMOVAL Left 03/22/2017   Procedure: MINOR REMOVAL PORT-A-CATH;  Surgeon: Aviva Signs, MD;  Location: [facility];  Service: General;  Laterality: Left;  . PORTACATH PLACEMENT  Right 11/03/2015   Procedure: INSERTION PORT-A-CATH;  Surgeon: Erroll Luna, MD;  Location: [facility];  Service: General;  Laterality: Right;     SOCIAL HISTORY:  Social History   Socioeconomic History  . Marital status: Married    Spouse name: Not on file  . Number of children: Not on file  . Years of education: Not on file  . Highest education level: Not on file  Occupational History  . Occupation: retired  Tobacco Use  . Smoking status: Never Smoker  . Smokeless tobacco: Never Used  Substance and Sexual Activity  . Alcohol use: No    Alcohol/week: 0.0 standard drinks  . Drug use: No  . Sexual activity: Never    Birth control/protection: Surgical  Other Topics Concern  . Not on file  Social History Narrative  . Not on file   Social Determinants of Health   Financial Resource Strain:   . Difficulty of Paying Living Expenses:   Food Insecurity:   . Worried About Charity fundraiser in the Last Year:   . Arboriculturist in the Last Year:   Transportation Needs:   .  Lack of Transportation (Medical):   Marland Kitchen Lack of Transportation (Non-Medical):   Physical Activity:   . Days of Exercise per Week:   . Minutes of Exercise per Session:   Stress:   . Feeling of Stress :   Social Connections:   . Frequency of Communication with Friends and Family:   . Frequency of Social Gatherings with Friends and Family:   . Attends Religious Services:   . Active Member of Clubs or Organizations:   . Attends Archivist Meetings:   Marland Kitchen Marital Status:   Intimate Partner Violence:   . Fear of Current or Ex-Partner:   . Emotionally Abused:   Marland Kitchen Physically Abused:   . Sexually Abused:     FAMILY HISTORY:  Family History  Problem Relation Age of Onset  . Breast cancer Mother   . Leukemia Father     CURRENT MEDICATIONS:  Outpatient Encounter Medications as of 02/25/2020  Medication Sig Note  . amLODipine (NORVASC) 5 MG tablet Take 5 mg by mouth daily.   Marland Kitchen  anastrozole (ARIMIDEX) 1 MG tablet TAKE 1 TABLET BY MOUTH EVERY DAY   . atorvastatin (LIPITOR) 10 MG tablet Take 10 mg by mouth daily.   . Calcium-Vitamin D-Vitamin K (VIACTIV) [phone] MG-UNT-MCG CHEW Chew 1 each by mouth daily.   Marland Kitchen dexamethasone (DECADRON) 4 MG tablet TAKE 2 TABLETS WEEKLY 03/16/2017: Wednesdays  . famotidine (PEPCID) 40 MG tablet Take 40 mg by mouth daily.   Marland Kitchen losartan (COZAAR) 25 MG tablet Take 25 mg by mouth daily.   . metFORMIN (GLUCOPHAGE-XR) 500 MG 24 hr tablet TAKE 2 TABLETS BY MOUTH ONCE A DAY WITH A MEAL   . omeprazole (PRILOSEC) 40 MG capsule Take 40 mg by mouth daily.   . SYMBICORT 160-4.5 MCG/ACT inhaler Inhale 2 puffs into the lungs 2 (two) times daily.   . vitamin B-12 (CYANOCOBALAMIN) 1000 MCG tablet Take 1,000 mcg by mouth daily.   Marland Kitchen albuterol (PROAIR HFA) 108 (90 BASE) MCG/ACT inhaler Inhale 2 puffs into the lungs every 6 (six) hours as needed for wheezing or shortness of breath.    No facility-administered encounter medications on file as of 02/25/2020.    ALLERGIES:  Allergies  Allergen Reactions  . Lisinopril Cough  . Losartan Hives    Questionable losartan vs lidocaine reaction  . Penicillins Rash     PHYSICAL EXAM:  ECOG Performance status: 1  Vitals:   02/25/20 1059  BP: (!) 142/70  Pulse: 78  Resp: 17  Temp: (!) 96.9 F (36.1 C)  SpO2: 99%   Filed Weights   02/25/20 1059  Weight: 103 lb 6.4 oz (46.9 kg)    Physical Exam Constitutional:      Appearance: Normal appearance. She is normal weight.  Cardiovascular:     Rate and Rhythm: Normal rate and regular rhythm.     Heart sounds: Normal heart sounds.  Pulmonary:     Breath sounds: Normal breath sounds.  Abdominal:     General: Bowel sounds are normal.     Palpations: Abdomen is soft.  Musculoskeletal:        General: Normal range of motion.  Skin:    General: Skin is warm.  Neurological:     Mental Status: She is alert and oriented to person, place, and time. Mental  status is at baseline.  Psychiatric:        Mood and Affect: Mood normal.        Behavior: Behavior normal.  Thought Content: Thought content normal.        Judgment: Judgment normal.      LABORATORY DATA:  I have reviewed the labs as listed.  CBC    Component Value Date/Time   WBC 12.3 (H) 02/18/2020 1146   RBC 3.90 02/18/2020 1146   HGB 11.8 (L) 02/18/2020 1146   HCT 37.4 02/18/2020 1146   PLT 279 02/18/2020 1146   MCV 95.9 02/18/2020 1146   MCH 30.3 02/18/2020 1146   MCHC 31.6 02/18/2020 1146   RDW 13.4 02/18/2020 1146   LYMPHSABS 3.0 02/18/2020 1146   MONOABS 0.9 02/18/2020 1146   EOSABS 0.4 02/18/2020 1146   BASOSABS 0.1 02/18/2020 1146   CMP Latest Ref Rng & Units 02/18/2020 08/20/2019 02/05/2019  Glucose 70 - 99 mg/dL 120(H) 171(H) 273(H)  BUN 8 - 23 mg/dL _0 Creatinine 0.44 - 1.00 mg/dL 1.07(H) 0.82 0.99  Sodium 135 - 145 mmol/L 138 138 139  Potassium 3.5 - 5.1 mmol/L 3.6 4.2 3.7  Chloride 98 - 111 mmol/L 102 105 104  CO2 22 - 32 mmol/L _1 Calcium 8.9 - 10.3 mg/dL 9.1 8.8(L) 9.0  Total Protein 6.5 - 8.1 g/dL 6.1(L) 6.0(L) 6.1(L)  Total Bilirubin 0.3 - 1.2 mg/dL 0.8 1.0 0.6  Alkaline Phos 38 - 126 U/L 49 63 52  AST 15 - 41 U/L _2 ALT 0 - 44 U/L _3 DIAGNOSTIC IMAGING:  I have independently reviewed the mammogram scans and discussed with the patient.    I personally performed a face-to-face visit,   All questions were answered to patient's stated satisfaction. Encouraged patient to call with any new concerns or questions before his next visit to the cancer center and we can certain see him sooner, if needed.     ASSESSMENT & PLAN:   Breast cancer of upper-inner quadrant of right female breast ([place]) 1.  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 -Anastrozole was initially started at the beginning of  2018. -Anastrozole switched to tamoxifen in January 2019 secondary to osteoporosis. -Tamoxifen was tolerated well but self discontinued by the patient due to vaginal discharge. - Anastrozole was restarted on 03/14/2018. -Today's examination did not reveal any palpable masses. - Her latest mammogram on 02/11/2020 was BI-RADS category 2 benign. -Labs done on 02/18/2020 showed WBC 12.3, hemoglobin 11.8, platelets 279, potassium 3.6, LDH 114 - She will follow-up in 1 year with labs AND MAMMOGRAM.  Patient [place] go [place]  2. Osteoporosis: - Patient was started on Prolia 60 mg every 6 months. -She started Prolia January 2019. -She will get her injection today on 08/23/2019. -She is also taking calcium 2 times a day.  And vitamin D3 2 times a day. -Her last DEXA scan was on 06/2016 which showed evidence of osteoporosis -We will repeat DEXA scan this week and call her with the results patient wishes to go off of Prolia and back on Fosamax.  3.  Lung nodule: - CT scan on 02/05/2019 showed unchanged 4 mm pulmonary nodule on the right lower lobe, stable for greater than 2 years and benign.   -No routine CT follow-up is recommended for this nodule.  4.  Vitamin B12 deficiency: -Labs on 08/20/2019 showed her vitamin B12 level at 185. -We will give her an injection of  B12 today. -We will start her on oral B12 daily. -Labs done on 02/18/2020 showed vitamin B12 627 -We will check her levels again at her next visit.      Orders placed this encounter:  Orders Placed This Encounter  Procedures  . DG Bone Density  . Lactate dehydrogenase  . CBC with Differential/Platelet  . Comprehensive metabolic panel  . Vitamin B12  . VITAMIN D 25 Hydroxy (Vit-D Deficiency, Fractures)  . Folate      Francene Finders, FNP-C [place] [phone]

## 2020-02-25 NOTE — Assessment & Plan Note (Addendum)
1.  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 -Anastrozole was initially started at the beginning of 2018. -Anastrozole switched to tamoxifen in January 2019 secondary to osteoporosis. -Tamoxifen was tolerated well but self discontinued by the patient due to vaginal discharge. - Anastrozole was restarted on 03/14/2018. -Today's examination did not reveal any palpable masses. - Her latest mammogram on 02/11/2020 was BI-RADS category 2 benign. -Labs done on 02/18/2020 showed WBC 12.3, hemoglobin 11.8, platelets 279, potassium 3.6, LDH 114 - She will follow-up in 1 year with labs AND MAMMOGRAM.  Patient [place] go [place]  2. Osteoporosis: - Patient was started on Prolia 60 mg every 6 months. -She started Prolia January 2019. -She will get her injection today on 08/23/2019. -She is also taking calcium 2 times a day.  And vitamin D3 2 times a day. -Her last DEXA scan was on 06/2016 which showed evidence of osteoporosis -We will repeat DEXA scan this week and call her with the results patient wishes to go off of Prolia and back on Fosamax.  3.  Lung nodule: - CT scan on 02/05/2019 showed unchanged 4 mm pulmonary nodule on the right lower lobe, stable for greater than 2 years and benign.   -No routine CT follow-up is recommended for this nodule.  4.  Vitamin B12 deficiency: -Labs on 08/20/2019 showed her vitamin B12 level at 185. -We will give her an injection of B12 today. -We will start her on oral B12 daily. -Labs done on 02/18/2020 showed vitamin B12 627 -We will check her levels again at her next visit.

## 2020-02-25 NOTE — Progress Notes (Signed)
Patient taking calcium as directed.  Denied tooth, jaw, and leg pain.  No recent or upcoming dental visits.  Labs reviewed.  Patient tolerated injection with no complaints voiced.  See MAR for details.  Site clean and dry with no bruising or swelling noted.  Band aid applied.  Vss with discharge and left ambulatory with no s/s of distress noted.  

## 2020-02-28 ENCOUNTER — Ambulatory Visit (HOSPITAL_COMMUNITY)
Admission: RE | Admit: 2020-02-28 | Discharge: 2020-02-28 | Disposition: A | Discharge status: Home / Self Care | Payer: Medicare Other | Source: Ambulatory Visit | Attending: Nurse Practitioner | Admitting: Nurse Practitioner

## 2020-02-28 ENCOUNTER — Other Ambulatory Visit: Payer: Self-pay

## 2020-02-28 DIAGNOSIS — M810 Age-related osteoporosis without current pathological fracture: Secondary | ICD-10-CM | POA: Diagnosis not present

## 2020-03-11 DIAGNOSIS — J479 Bronchiectasis, uncomplicated: Secondary | ICD-10-CM | POA: Diagnosis not present

## 2020-03-11 DIAGNOSIS — E1121 Type 2 diabetes mellitus with diabetic nephropathy: Secondary | ICD-10-CM | POA: Diagnosis not present

## 2020-03-11 DIAGNOSIS — I129 Hypertensive chronic kidney disease with stage 1 through stage 4 chronic kidney disease, or unspecified chronic kidney disease: Secondary | ICD-10-CM | POA: Diagnosis not present

## 2020-03-11 DIAGNOSIS — J45909 Unspecified asthma, uncomplicated: Secondary | ICD-10-CM | POA: Diagnosis not present

## 2020-03-11 DIAGNOSIS — C50211 Malignant neoplasm of upper-inner quadrant of right female breast: Secondary | ICD-10-CM | POA: Diagnosis not present

## 2020-03-11 DIAGNOSIS — C50411 Malignant neoplasm of upper-outer quadrant of right female breast: Secondary | ICD-10-CM | POA: Diagnosis not present

## 2020-03-11 DIAGNOSIS — M810 Age-related osteoporosis without current pathological fracture: Secondary | ICD-10-CM | POA: Diagnosis not present

## 2020-03-11 DIAGNOSIS — N1831 Chronic kidney disease, stage 3a: Secondary | ICD-10-CM | POA: Diagnosis not present

## 2020-03-11 DIAGNOSIS — Z7984 Long term (current) use of oral hypoglycemic drugs: Secondary | ICD-10-CM | POA: Diagnosis not present

## 2020-03-11 DIAGNOSIS — E7800 Pure hypercholesterolemia, unspecified: Secondary | ICD-10-CM | POA: Diagnosis not present

## 2020-04-08 DIAGNOSIS — I129 Hypertensive chronic kidney disease with stage 1 through stage 4 chronic kidney disease, or unspecified chronic kidney disease: Secondary | ICD-10-CM | POA: Diagnosis not present

## 2020-04-08 DIAGNOSIS — J479 Bronchiectasis, uncomplicated: Secondary | ICD-10-CM | POA: Diagnosis not present

## 2020-04-08 DIAGNOSIS — E1121 Type 2 diabetes mellitus with diabetic nephropathy: Secondary | ICD-10-CM | POA: Diagnosis not present

## 2020-04-08 DIAGNOSIS — M810 Age-related osteoporosis without current pathological fracture: Secondary | ICD-10-CM | POA: Diagnosis not present

## 2020-04-08 DIAGNOSIS — E7800 Pure hypercholesterolemia, unspecified: Secondary | ICD-10-CM | POA: Diagnosis not present

## 2020-04-08 DIAGNOSIS — C50411 Malignant neoplasm of upper-outer quadrant of right female breast: Secondary | ICD-10-CM | POA: Diagnosis not present

## 2020-04-08 DIAGNOSIS — J45909 Unspecified asthma, uncomplicated: Secondary | ICD-10-CM | POA: Diagnosis not present

## 2020-04-08 DIAGNOSIS — C50211 Malignant neoplasm of upper-inner quadrant of right female breast: Secondary | ICD-10-CM | POA: Diagnosis not present

## 2020-04-08 DIAGNOSIS — N1831 Chronic kidney disease, stage 3a: Secondary | ICD-10-CM | POA: Diagnosis not present

## 2020-07-14 DIAGNOSIS — H524 Presbyopia: Secondary | ICD-10-CM | POA: Diagnosis not present

## 2020-07-14 DIAGNOSIS — E119 Type 2 diabetes mellitus without complications: Secondary | ICD-10-CM | POA: Diagnosis not present

## 2020-07-31 DIAGNOSIS — E1121 Type 2 diabetes mellitus with diabetic nephropathy: Secondary | ICD-10-CM | POA: Diagnosis not present

## 2020-07-31 DIAGNOSIS — C50211 Malignant neoplasm of upper-inner quadrant of right female breast: Secondary | ICD-10-CM | POA: Diagnosis not present

## 2020-07-31 DIAGNOSIS — I129 Hypertensive chronic kidney disease with stage 1 through stage 4 chronic kidney disease, or unspecified chronic kidney disease: Secondary | ICD-10-CM | POA: Diagnosis not present

## 2020-07-31 DIAGNOSIS — J45909 Unspecified asthma, uncomplicated: Secondary | ICD-10-CM | POA: Diagnosis not present

## 2020-07-31 DIAGNOSIS — N1831 Chronic kidney disease, stage 3a: Secondary | ICD-10-CM | POA: Diagnosis not present

## 2020-07-31 DIAGNOSIS — C50411 Malignant neoplasm of upper-outer quadrant of right female breast: Secondary | ICD-10-CM | POA: Diagnosis not present

## 2020-07-31 DIAGNOSIS — E7800 Pure hypercholesterolemia, unspecified: Secondary | ICD-10-CM | POA: Diagnosis not present

## 2020-07-31 DIAGNOSIS — J479 Bronchiectasis, uncomplicated: Secondary | ICD-10-CM | POA: Diagnosis not present

## 2020-07-31 DIAGNOSIS — M810 Age-related osteoporosis without current pathological fracture: Secondary | ICD-10-CM | POA: Diagnosis not present

## 2020-07-31 DIAGNOSIS — Z7984 Long term (current) use of oral hypoglycemic drugs: Secondary | ICD-10-CM | POA: Diagnosis not present

## 2020-08-18 DIAGNOSIS — B078 Other viral warts: Secondary | ICD-10-CM | POA: Diagnosis not present

## 2020-08-18 DIAGNOSIS — M79672 Pain in left foot: Secondary | ICD-10-CM | POA: Diagnosis not present

## 2020-08-27 DIAGNOSIS — L02212 Cutaneous abscess of back [any part, except buttock]: Secondary | ICD-10-CM | POA: Diagnosis not present

## 2020-09-01 DIAGNOSIS — I129 Hypertensive chronic kidney disease with stage 1 through stage 4 chronic kidney disease, or unspecified chronic kidney disease: Secondary | ICD-10-CM | POA: Diagnosis not present

## 2020-09-01 DIAGNOSIS — N1831 Chronic kidney disease, stage 3a: Secondary | ICD-10-CM | POA: Diagnosis not present

## 2020-09-01 DIAGNOSIS — J479 Bronchiectasis, uncomplicated: Secondary | ICD-10-CM | POA: Diagnosis not present

## 2020-09-01 DIAGNOSIS — E7800 Pure hypercholesterolemia, unspecified: Secondary | ICD-10-CM | POA: Diagnosis not present

## 2020-09-01 DIAGNOSIS — M810 Age-related osteoporosis without current pathological fracture: Secondary | ICD-10-CM | POA: Diagnosis not present

## 2020-09-01 DIAGNOSIS — C50411 Malignant neoplasm of upper-outer quadrant of right female breast: Secondary | ICD-10-CM | POA: Diagnosis not present

## 2020-09-01 DIAGNOSIS — J45909 Unspecified asthma, uncomplicated: Secondary | ICD-10-CM | POA: Diagnosis not present

## 2020-09-01 DIAGNOSIS — C50211 Malignant neoplasm of upper-inner quadrant of right female breast: Secondary | ICD-10-CM | POA: Diagnosis not present

## 2020-09-01 DIAGNOSIS — E1121 Type 2 diabetes mellitus with diabetic nephropathy: Secondary | ICD-10-CM | POA: Diagnosis not present

## 2020-09-03 DIAGNOSIS — J479 Bronchiectasis, uncomplicated: Secondary | ICD-10-CM | POA: Diagnosis not present

## 2020-09-03 DIAGNOSIS — E7800 Pure hypercholesterolemia, unspecified: Secondary | ICD-10-CM | POA: Diagnosis not present

## 2020-09-03 DIAGNOSIS — R911 Solitary pulmonary nodule: Secondary | ICD-10-CM | POA: Diagnosis not present

## 2020-09-03 DIAGNOSIS — E113293 Type 2 diabetes mellitus with mild nonproliferative diabetic retinopathy without macular edema, bilateral: Secondary | ICD-10-CM | POA: Diagnosis not present

## 2020-09-03 DIAGNOSIS — I129 Hypertensive chronic kidney disease with stage 1 through stage 4 chronic kidney disease, or unspecified chronic kidney disease: Secondary | ICD-10-CM | POA: Diagnosis not present

## 2020-09-03 DIAGNOSIS — Z853 Personal history of malignant neoplasm of breast: Secondary | ICD-10-CM | POA: Diagnosis not present

## 2020-09-03 DIAGNOSIS — K219 Gastro-esophageal reflux disease without esophagitis: Secondary | ICD-10-CM | POA: Diagnosis not present

## 2020-09-03 DIAGNOSIS — M810 Age-related osteoporosis without current pathological fracture: Secondary | ICD-10-CM | POA: Diagnosis not present

## 2020-09-03 DIAGNOSIS — N1831 Chronic kidney disease, stage 3a: Secondary | ICD-10-CM | POA: Diagnosis not present

## 2020-09-29 DIAGNOSIS — B078 Other viral warts: Secondary | ICD-10-CM | POA: Diagnosis not present

## 2020-10-23 DIAGNOSIS — H26493 Other secondary cataract, bilateral: Secondary | ICD-10-CM | POA: Diagnosis not present

## 2020-10-23 DIAGNOSIS — H40023 Open angle with borderline findings, high risk, bilateral: Secondary | ICD-10-CM | POA: Diagnosis not present

## 2020-10-28 DIAGNOSIS — M810 Age-related osteoporosis without current pathological fracture: Secondary | ICD-10-CM | POA: Diagnosis not present

## 2020-10-28 DIAGNOSIS — E119 Type 2 diabetes mellitus without complications: Secondary | ICD-10-CM | POA: Diagnosis not present

## 2020-10-28 DIAGNOSIS — I129 Hypertensive chronic kidney disease with stage 1 through stage 4 chronic kidney disease, or unspecified chronic kidney disease: Secondary | ICD-10-CM | POA: Diagnosis not present

## 2020-10-28 DIAGNOSIS — N1831 Chronic kidney disease, stage 3a: Secondary | ICD-10-CM | POA: Diagnosis not present

## 2020-10-28 DIAGNOSIS — J45909 Unspecified asthma, uncomplicated: Secondary | ICD-10-CM | POA: Diagnosis not present

## 2020-10-28 DIAGNOSIS — K219 Gastro-esophageal reflux disease without esophagitis: Secondary | ICD-10-CM | POA: Diagnosis not present

## 2020-10-28 DIAGNOSIS — C50211 Malignant neoplasm of upper-inner quadrant of right female breast: Secondary | ICD-10-CM | POA: Diagnosis not present

## 2020-10-28 DIAGNOSIS — E1121 Type 2 diabetes mellitus with diabetic nephropathy: Secondary | ICD-10-CM | POA: Diagnosis not present

## 2020-10-28 DIAGNOSIS — E7800 Pure hypercholesterolemia, unspecified: Secondary | ICD-10-CM | POA: Diagnosis not present

## 2020-10-28 DIAGNOSIS — C50411 Malignant neoplasm of upper-outer quadrant of right female breast: Secondary | ICD-10-CM | POA: Diagnosis not present

## 2020-10-28 DIAGNOSIS — C50911 Malignant neoplasm of unspecified site of right female breast: Secondary | ICD-10-CM | POA: Diagnosis not present

## 2020-10-29 DIAGNOSIS — B078 Other viral warts: Secondary | ICD-10-CM | POA: Diagnosis not present

## 2020-10-29 DIAGNOSIS — M7742 Metatarsalgia, left foot: Secondary | ICD-10-CM | POA: Diagnosis not present

## 2020-11-16 DIAGNOSIS — J45909 Unspecified asthma, uncomplicated: Secondary | ICD-10-CM | POA: Diagnosis not present

## 2020-11-16 DIAGNOSIS — E7800 Pure hypercholesterolemia, unspecified: Secondary | ICD-10-CM | POA: Diagnosis not present

## 2020-11-16 DIAGNOSIS — C50211 Malignant neoplasm of upper-inner quadrant of right female breast: Secondary | ICD-10-CM | POA: Diagnosis not present

## 2020-11-16 DIAGNOSIS — E1121 Type 2 diabetes mellitus with diabetic nephropathy: Secondary | ICD-10-CM | POA: Diagnosis not present

## 2020-11-16 DIAGNOSIS — M810 Age-related osteoporosis without current pathological fracture: Secondary | ICD-10-CM | POA: Diagnosis not present

## 2020-11-16 DIAGNOSIS — C50411 Malignant neoplasm of upper-outer quadrant of right female breast: Secondary | ICD-10-CM | POA: Diagnosis not present

## 2020-11-16 DIAGNOSIS — K219 Gastro-esophageal reflux disease without esophagitis: Secondary | ICD-10-CM | POA: Diagnosis not present

## 2020-11-16 DIAGNOSIS — C50911 Malignant neoplasm of unspecified site of right female breast: Secondary | ICD-10-CM | POA: Diagnosis not present

## 2020-11-16 DIAGNOSIS — E119 Type 2 diabetes mellitus without complications: Secondary | ICD-10-CM | POA: Diagnosis not present

## 2020-11-16 DIAGNOSIS — N1831 Chronic kidney disease, stage 3a: Secondary | ICD-10-CM | POA: Diagnosis not present

## 2020-11-16 DIAGNOSIS — I129 Hypertensive chronic kidney disease with stage 1 through stage 4 chronic kidney disease, or unspecified chronic kidney disease: Secondary | ICD-10-CM | POA: Diagnosis not present

## 2020-12-31 DIAGNOSIS — R5383 Other fatigue: Secondary | ICD-10-CM | POA: Diagnosis not present

## 2020-12-31 DIAGNOSIS — I499 Cardiac arrhythmia, unspecified: Secondary | ICD-10-CM | POA: Diagnosis not present

## 2020-12-31 DIAGNOSIS — R1110 Vomiting, unspecified: Secondary | ICD-10-CM | POA: Diagnosis not present

## 2020-12-31 DIAGNOSIS — R0981 Nasal congestion: Secondary | ICD-10-CM | POA: Diagnosis not present

## 2020-12-31 DIAGNOSIS — R059 Cough, unspecified: Secondary | ICD-10-CM | POA: Diagnosis not present

## 2020-12-31 DIAGNOSIS — R067 Sneezing: Secondary | ICD-10-CM | POA: Diagnosis not present

## 2020-12-31 DIAGNOSIS — U071 COVID-19: Secondary | ICD-10-CM | POA: Diagnosis not present

## 2021-01-04 DIAGNOSIS — C50911 Malignant neoplasm of unspecified site of right female breast: Secondary | ICD-10-CM | POA: Diagnosis not present

## 2021-01-04 DIAGNOSIS — M810 Age-related osteoporosis without current pathological fracture: Secondary | ICD-10-CM | POA: Diagnosis not present

## 2021-01-04 DIAGNOSIS — E119 Type 2 diabetes mellitus without complications: Secondary | ICD-10-CM | POA: Diagnosis not present

## 2021-01-04 DIAGNOSIS — J45909 Unspecified asthma, uncomplicated: Secondary | ICD-10-CM | POA: Diagnosis not present

## 2021-01-04 DIAGNOSIS — E1121 Type 2 diabetes mellitus with diabetic nephropathy: Secondary | ICD-10-CM | POA: Diagnosis not present

## 2021-01-04 DIAGNOSIS — J479 Bronchiectasis, uncomplicated: Secondary | ICD-10-CM | POA: Diagnosis not present

## 2021-01-04 DIAGNOSIS — E7800 Pure hypercholesterolemia, unspecified: Secondary | ICD-10-CM | POA: Diagnosis not present

## 2021-01-04 DIAGNOSIS — U071 COVID-19: Secondary | ICD-10-CM | POA: Diagnosis not present

## 2021-01-04 DIAGNOSIS — C50211 Malignant neoplasm of upper-inner quadrant of right female breast: Secondary | ICD-10-CM | POA: Diagnosis not present

## 2021-01-04 DIAGNOSIS — K219 Gastro-esophageal reflux disease without esophagitis: Secondary | ICD-10-CM | POA: Diagnosis not present

## 2021-01-04 DIAGNOSIS — N1831 Chronic kidney disease, stage 3a: Secondary | ICD-10-CM | POA: Diagnosis not present

## 2021-01-04 DIAGNOSIS — C50411 Malignant neoplasm of upper-outer quadrant of right female breast: Secondary | ICD-10-CM | POA: Diagnosis not present

## 2021-01-04 DIAGNOSIS — I129 Hypertensive chronic kidney disease with stage 1 through stage 4 chronic kidney disease, or unspecified chronic kidney disease: Secondary | ICD-10-CM | POA: Diagnosis not present

## 2021-01-06 ENCOUNTER — Telehealth: Payer: Self-pay | Admitting: Family

## 2021-01-06 DIAGNOSIS — R918 Other nonspecific abnormal finding of lung field: Secondary | ICD-10-CM | POA: Diagnosis not present

## 2021-01-06 DIAGNOSIS — E871 Hypo-osmolality and hyponatremia: Secondary | ICD-10-CM | POA: Diagnosis not present

## 2021-01-06 DIAGNOSIS — U071 COVID-19: Secondary | ICD-10-CM | POA: Diagnosis not present

## 2021-01-06 DIAGNOSIS — Z79899 Other long term (current) drug therapy: Secondary | ICD-10-CM | POA: Diagnosis not present

## 2021-01-06 DIAGNOSIS — Z888 Allergy status to other drugs, medicaments and biological substances status: Secondary | ICD-10-CM | POA: Diagnosis not present

## 2021-01-06 DIAGNOSIS — E119 Type 2 diabetes mellitus without complications: Secondary | ICD-10-CM | POA: Diagnosis not present

## 2021-01-06 DIAGNOSIS — Z880 Allergy status to penicillin: Secondary | ICD-10-CM | POA: Diagnosis not present

## 2021-01-06 DIAGNOSIS — R059 Cough, unspecified: Secondary | ICD-10-CM | POA: Diagnosis not present

## 2021-01-06 DIAGNOSIS — I10 Essential (primary) hypertension: Secondary | ICD-10-CM | POA: Diagnosis not present

## 2021-01-06 DIAGNOSIS — Z7951 Long term (current) use of inhaled steroids: Secondary | ICD-10-CM | POA: Diagnosis not present

## 2021-01-06 DIAGNOSIS — Z23 Encounter for immunization: Secondary | ICD-10-CM | POA: Diagnosis not present

## 2021-01-06 DIAGNOSIS — Z7984 Long term (current) use of oral hypoglycemic drugs: Secondary | ICD-10-CM | POA: Diagnosis not present

## 2021-01-06 MED ORDER — PROMETHAZINE-DM 6.25-15 MG/5ML PO SYRP: 5.0000 mL | ORAL_SOLUTION | Freq: Four times a day (QID) | ORAL | 0 refills | Status: AC | PRN

## 2021-01-06 NOTE — Telephone Encounter (Signed)
Called to discuss with patient about COVID-19 symptoms and the use of one of the available treatments for those with mild to moderate Covid symptoms and at a high risk of hospitalization.  Pt appears to qualify for outpatient treatment due to co-morbid conditions and/or a member of an at-risk group in accordance with the FDA Emergency Use Authorization.    Symptom onset: 2/15 or 2/16 - hard to determine Positive: 2/17  Vaccinated: No Booster? No Immunocompromised? No Qualifiers: unvaccinated, age, cardiovascular, DM2,   She denies shortness of breath. She is monitoring her oxygen level at home, it was 93% on room air when checked on the phone. Endorses productive cough. Has been taking Tylenol as needed for fever. No additional over the counter therapies attempted. We began to discuss MAB, however she shares with me that she is presently at [facility] and does not have transport back to [place] to treatment. Difficult to refer her to [place] near [facility] as she is on day 9 of symptoms today. On the phone she sounds well and is in no apparent distress. Encouraged to use Muccinex OTC for cough. I have sent Rx for promethazine-dextromethorphan to her pharmacy in [place], [place] for her to pick up. Will route this note to her PCP so he is aware.  Loel Dubonnet

## 2021-01-06 NOTE — Addendum Note (Signed)
Addended by: Loel Dubonnet on: 01/06/2021 10:57 AM   Modules accepted: Orders

## 2021-01-06 NOTE — Telephone Encounter (Signed)
Called to discuss with patient about COVID-19 symptoms and the use of one of the available treatments for those with mild to moderate Covid symptoms and at a high risk of hospitalization.  Pt appears to qualify for outpatient treatment due to co-morbid conditions and/or a member of an at-risk group in accordance with the FDA Emergency Use Authorization.    Symptom onset: unknown Test date: unknown Vaccinated: unknown Booster? unknown Qualifiers: age, cardiovascular, DM2  Unable to reach pt - Left VM on cell phone requesting call back. Did not send MyChart as last log in 02/2020.   Loel Dubonnet

## 2021-01-07 DIAGNOSIS — U071 COVID-19: Secondary | ICD-10-CM | POA: Diagnosis not present

## 2021-01-11 DIAGNOSIS — U071 COVID-19: Secondary | ICD-10-CM | POA: Diagnosis not present

## 2021-02-11 ENCOUNTER — Other Ambulatory Visit (HOSPITAL_COMMUNITY): Payer: Medicare Other

## 2021-02-12 ENCOUNTER — Ambulatory Visit (HOSPITAL_COMMUNITY): Payer: Medicare Other | Admitting: Nurse Practitioner

## 2021-02-15 ENCOUNTER — Inpatient Hospital Stay (HOSPITAL_COMMUNITY): Payer: Medicare Other

## 2021-02-15 ENCOUNTER — Ambulatory Visit (HOSPITAL_COMMUNITY): Payer: Medicare Other

## 2021-02-16 ENCOUNTER — Ambulatory Visit (HOSPITAL_COMMUNITY): Payer: Medicare Other | Admitting: Hematology

## 2021-03-04 DIAGNOSIS — I129 Hypertensive chronic kidney disease with stage 1 through stage 4 chronic kidney disease, or unspecified chronic kidney disease: Secondary | ICD-10-CM | POA: Diagnosis not present

## 2021-03-04 DIAGNOSIS — N1831 Chronic kidney disease, stage 3a: Secondary | ICD-10-CM | POA: Diagnosis not present

## 2021-03-04 DIAGNOSIS — E119 Type 2 diabetes mellitus without complications: Secondary | ICD-10-CM | POA: Diagnosis not present

## 2021-03-10 DIAGNOSIS — M810 Age-related osteoporosis without current pathological fracture: Secondary | ICD-10-CM | POA: Diagnosis not present

## 2021-03-10 DIAGNOSIS — C50411 Malignant neoplasm of upper-outer quadrant of right female breast: Secondary | ICD-10-CM | POA: Diagnosis not present

## 2021-03-10 DIAGNOSIS — E7800 Pure hypercholesterolemia, unspecified: Secondary | ICD-10-CM | POA: Diagnosis not present

## 2021-03-10 DIAGNOSIS — J45909 Unspecified asthma, uncomplicated: Secondary | ICD-10-CM | POA: Diagnosis not present

## 2021-03-10 DIAGNOSIS — K219 Gastro-esophageal reflux disease without esophagitis: Secondary | ICD-10-CM | POA: Diagnosis not present

## 2021-03-10 DIAGNOSIS — E1121 Type 2 diabetes mellitus with diabetic nephropathy: Secondary | ICD-10-CM | POA: Diagnosis not present

## 2021-03-10 DIAGNOSIS — J479 Bronchiectasis, uncomplicated: Secondary | ICD-10-CM | POA: Diagnosis not present

## 2021-03-10 DIAGNOSIS — N1831 Chronic kidney disease, stage 3a: Secondary | ICD-10-CM | POA: Diagnosis not present

## 2021-03-10 DIAGNOSIS — I129 Hypertensive chronic kidney disease with stage 1 through stage 4 chronic kidney disease, or unspecified chronic kidney disease: Secondary | ICD-10-CM | POA: Diagnosis not present

## 2021-03-11 DIAGNOSIS — C50211 Malignant neoplasm of upper-inner quadrant of right female breast: Secondary | ICD-10-CM | POA: Diagnosis not present

## 2021-03-11 DIAGNOSIS — E7800 Pure hypercholesterolemia, unspecified: Secondary | ICD-10-CM | POA: Diagnosis not present

## 2021-03-11 DIAGNOSIS — E538 Deficiency of other specified B group vitamins: Secondary | ICD-10-CM | POA: Diagnosis not present

## 2021-03-11 DIAGNOSIS — E113293 Type 2 diabetes mellitus with mild nonproliferative diabetic retinopathy without macular edema, bilateral: Secondary | ICD-10-CM | POA: Diagnosis not present

## 2021-03-11 DIAGNOSIS — Z170 Estrogen receptor positive status [ER+]: Secondary | ICD-10-CM | POA: Diagnosis not present

## 2021-03-11 DIAGNOSIS — I129 Hypertensive chronic kidney disease with stage 1 through stage 4 chronic kidney disease, or unspecified chronic kidney disease: Secondary | ICD-10-CM | POA: Diagnosis not present

## 2021-03-11 DIAGNOSIS — Z9889 Other specified postprocedural states: Secondary | ICD-10-CM | POA: Diagnosis not present

## 2021-03-11 DIAGNOSIS — N1831 Chronic kidney disease, stage 3a: Secondary | ICD-10-CM | POA: Diagnosis not present

## 2021-03-11 DIAGNOSIS — R911 Solitary pulmonary nodule: Secondary | ICD-10-CM | POA: Diagnosis not present

## 2021-03-11 DIAGNOSIS — J479 Bronchiectasis, uncomplicated: Secondary | ICD-10-CM | POA: Diagnosis not present

## 2021-04-13 DIAGNOSIS — B078 Other viral warts: Secondary | ICD-10-CM | POA: Diagnosis not present

## 2021-04-16 DIAGNOSIS — K219 Gastro-esophageal reflux disease without esophagitis: Secondary | ICD-10-CM | POA: Diagnosis not present

## 2021-04-16 DIAGNOSIS — M810 Age-related osteoporosis without current pathological fracture: Secondary | ICD-10-CM | POA: Diagnosis not present

## 2021-04-16 DIAGNOSIS — E7800 Pure hypercholesterolemia, unspecified: Secondary | ICD-10-CM | POA: Diagnosis not present

## 2021-04-16 DIAGNOSIS — J479 Bronchiectasis, uncomplicated: Secondary | ICD-10-CM | POA: Diagnosis not present

## 2021-04-16 DIAGNOSIS — C50411 Malignant neoplasm of upper-outer quadrant of right female breast: Secondary | ICD-10-CM | POA: Diagnosis not present

## 2021-04-16 DIAGNOSIS — N1831 Chronic kidney disease, stage 3a: Secondary | ICD-10-CM | POA: Diagnosis not present

## 2021-04-16 DIAGNOSIS — J45909 Unspecified asthma, uncomplicated: Secondary | ICD-10-CM | POA: Diagnosis not present

## 2021-04-16 DIAGNOSIS — I129 Hypertensive chronic kidney disease with stage 1 through stage 4 chronic kidney disease, or unspecified chronic kidney disease: Secondary | ICD-10-CM | POA: Diagnosis not present

## 2021-04-16 DIAGNOSIS — E1121 Type 2 diabetes mellitus with diabetic nephropathy: Secondary | ICD-10-CM | POA: Diagnosis not present

## 2021-05-11 ENCOUNTER — Other Ambulatory Visit (HOSPITAL_COMMUNITY): Payer: Self-pay | Admitting: *Deleted

## 2021-05-11 DIAGNOSIS — C50211 Malignant neoplasm of upper-inner quadrant of right female breast: Secondary | ICD-10-CM

## 2021-05-11 MED ORDER — ANASTROZOLE 1 MG PO TABS: 1.0000 mg | ORAL_TABLET | Freq: Every day | ORAL | 4 refills | Status: DC

## 2021-05-27 DIAGNOSIS — B078 Other viral warts: Secondary | ICD-10-CM | POA: Diagnosis not present

## 2021-06-02 DIAGNOSIS — Z170 Estrogen receptor positive status [ER+]: Secondary | ICD-10-CM | POA: Diagnosis not present

## 2021-06-02 DIAGNOSIS — R911 Solitary pulmonary nodule: Secondary | ICD-10-CM | POA: Diagnosis not present

## 2021-06-02 DIAGNOSIS — C50919 Malignant neoplasm of unspecified site of unspecified female breast: Secondary | ICD-10-CM | POA: Diagnosis not present

## 2021-06-02 DIAGNOSIS — C50211 Malignant neoplasm of upper-inner quadrant of right female breast: Secondary | ICD-10-CM | POA: Diagnosis not present

## 2021-06-02 DIAGNOSIS — M810 Age-related osteoporosis without current pathological fracture: Secondary | ICD-10-CM | POA: Diagnosis not present

## 2021-06-14 DIAGNOSIS — R918 Other nonspecific abnormal finding of lung field: Secondary | ICD-10-CM | POA: Diagnosis not present

## 2021-06-14 DIAGNOSIS — E119 Type 2 diabetes mellitus without complications: Secondary | ICD-10-CM | POA: Diagnosis not present

## 2021-06-14 DIAGNOSIS — Z853 Personal history of malignant neoplasm of breast: Secondary | ICD-10-CM | POA: Diagnosis not present

## 2021-06-14 DIAGNOSIS — Z1231 Encounter for screening mammogram for malignant neoplasm of breast: Secondary | ICD-10-CM | POA: Diagnosis not present

## 2021-06-14 DIAGNOSIS — R911 Solitary pulmonary nodule: Secondary | ICD-10-CM | POA: Diagnosis not present

## 2021-06-14 DIAGNOSIS — K449 Diaphragmatic hernia without obstruction or gangrene: Secondary | ICD-10-CM | POA: Diagnosis not present

## 2021-06-14 DIAGNOSIS — I10 Essential (primary) hypertension: Secondary | ICD-10-CM | POA: Diagnosis not present

## 2021-06-14 DIAGNOSIS — C50211 Malignant neoplasm of upper-inner quadrant of right female breast: Secondary | ICD-10-CM | POA: Diagnosis not present

## 2021-06-15 DIAGNOSIS — N1831 Chronic kidney disease, stage 3a: Secondary | ICD-10-CM | POA: Diagnosis not present

## 2021-06-15 DIAGNOSIS — K219 Gastro-esophageal reflux disease without esophagitis: Secondary | ICD-10-CM | POA: Diagnosis not present

## 2021-06-15 DIAGNOSIS — J45909 Unspecified asthma, uncomplicated: Secondary | ICD-10-CM | POA: Diagnosis not present

## 2021-06-15 DIAGNOSIS — E1121 Type 2 diabetes mellitus with diabetic nephropathy: Secondary | ICD-10-CM | POA: Diagnosis not present

## 2021-06-15 DIAGNOSIS — I129 Hypertensive chronic kidney disease with stage 1 through stage 4 chronic kidney disease, or unspecified chronic kidney disease: Secondary | ICD-10-CM | POA: Diagnosis not present

## 2021-06-15 DIAGNOSIS — C50411 Malignant neoplasm of upper-outer quadrant of right female breast: Secondary | ICD-10-CM | POA: Diagnosis not present

## 2021-06-15 DIAGNOSIS — J479 Bronchiectasis, uncomplicated: Secondary | ICD-10-CM | POA: Diagnosis not present

## 2021-06-15 DIAGNOSIS — E7800 Pure hypercholesterolemia, unspecified: Secondary | ICD-10-CM | POA: Diagnosis not present

## 2021-06-15 DIAGNOSIS — M810 Age-related osteoporosis without current pathological fracture: Secondary | ICD-10-CM | POA: Diagnosis not present

## 2021-06-18 DIAGNOSIS — I129 Hypertensive chronic kidney disease with stage 1 through stage 4 chronic kidney disease, or unspecified chronic kidney disease: Secondary | ICD-10-CM | POA: Diagnosis not present

## 2021-06-18 DIAGNOSIS — N1831 Chronic kidney disease, stage 3a: Secondary | ICD-10-CM | POA: Diagnosis not present

## 2021-06-18 DIAGNOSIS — E119 Type 2 diabetes mellitus without complications: Secondary | ICD-10-CM | POA: Diagnosis not present

## 2021-06-18 DIAGNOSIS — E7800 Pure hypercholesterolemia, unspecified: Secondary | ICD-10-CM | POA: Diagnosis not present

## 2021-06-18 DIAGNOSIS — E538 Deficiency of other specified B group vitamins: Secondary | ICD-10-CM | POA: Diagnosis not present

## 2021-06-24 DIAGNOSIS — E7800 Pure hypercholesterolemia, unspecified: Secondary | ICD-10-CM | POA: Diagnosis not present

## 2021-06-24 DIAGNOSIS — E538 Deficiency of other specified B group vitamins: Secondary | ICD-10-CM | POA: Diagnosis not present

## 2021-06-24 DIAGNOSIS — E119 Type 2 diabetes mellitus without complications: Secondary | ICD-10-CM | POA: Diagnosis not present

## 2021-06-24 DIAGNOSIS — N1831 Chronic kidney disease, stage 3a: Secondary | ICD-10-CM | POA: Diagnosis not present

## 2021-06-30 DIAGNOSIS — Z170 Estrogen receptor positive status [ER+]: Secondary | ICD-10-CM | POA: Diagnosis not present

## 2021-06-30 DIAGNOSIS — R911 Solitary pulmonary nodule: Secondary | ICD-10-CM | POA: Diagnosis not present

## 2021-06-30 DIAGNOSIS — C50211 Malignant neoplasm of upper-inner quadrant of right female breast: Secondary | ICD-10-CM | POA: Diagnosis not present

## 2021-06-30 DIAGNOSIS — M810 Age-related osteoporosis without current pathological fracture: Secondary | ICD-10-CM | POA: Diagnosis not present

## 2021-06-30 DIAGNOSIS — C50919 Malignant neoplasm of unspecified site of unspecified female breast: Secondary | ICD-10-CM | POA: Diagnosis not present

## 2021-07-02 DIAGNOSIS — H40023 Open angle with borderline findings, high risk, bilateral: Secondary | ICD-10-CM | POA: Diagnosis not present

## 2021-07-02 DIAGNOSIS — H26493 Other secondary cataract, bilateral: Secondary | ICD-10-CM | POA: Diagnosis not present

## 2021-09-09 DIAGNOSIS — M810 Age-related osteoporosis without current pathological fracture: Secondary | ICD-10-CM | POA: Diagnosis not present

## 2021-09-09 DIAGNOSIS — I129 Hypertensive chronic kidney disease with stage 1 through stage 4 chronic kidney disease, or unspecified chronic kidney disease: Secondary | ICD-10-CM | POA: Diagnosis not present

## 2021-09-09 DIAGNOSIS — N1831 Chronic kidney disease, stage 3a: Secondary | ICD-10-CM | POA: Diagnosis not present

## 2021-09-09 DIAGNOSIS — E1121 Type 2 diabetes mellitus with diabetic nephropathy: Secondary | ICD-10-CM | POA: Diagnosis not present

## 2021-09-09 DIAGNOSIS — K219 Gastro-esophageal reflux disease without esophagitis: Secondary | ICD-10-CM | POA: Diagnosis not present

## 2021-09-09 DIAGNOSIS — J45909 Unspecified asthma, uncomplicated: Secondary | ICD-10-CM | POA: Diagnosis not present

## 2021-09-09 DIAGNOSIS — J479 Bronchiectasis, uncomplicated: Secondary | ICD-10-CM | POA: Diagnosis not present

## 2021-09-09 DIAGNOSIS — E7800 Pure hypercholesterolemia, unspecified: Secondary | ICD-10-CM | POA: Diagnosis not present

## 2021-09-15 DIAGNOSIS — E119 Type 2 diabetes mellitus without complications: Secondary | ICD-10-CM | POA: Diagnosis not present

## 2021-09-15 DIAGNOSIS — N1831 Chronic kidney disease, stage 3a: Secondary | ICD-10-CM | POA: Diagnosis not present

## 2021-09-24 DIAGNOSIS — I459 Conduction disorder, unspecified: Secondary | ICD-10-CM | POA: Diagnosis not present

## 2021-09-24 DIAGNOSIS — L989 Disorder of the skin and subcutaneous tissue, unspecified: Secondary | ICD-10-CM | POA: Diagnosis not present

## 2021-09-24 DIAGNOSIS — E119 Type 2 diabetes mellitus without complications: Secondary | ICD-10-CM | POA: Diagnosis not present

## 2021-09-24 DIAGNOSIS — I491 Atrial premature depolarization: Secondary | ICD-10-CM | POA: Diagnosis not present

## 2022-08-06 ENCOUNTER — Other Ambulatory Visit (HOSPITAL_COMMUNITY): Payer: Self-pay | Admitting: Hematology

## 2022-08-06 DIAGNOSIS — Z170 Estrogen receptor positive status [ER+]: Secondary | ICD-10-CM

## 2022-08-08 ENCOUNTER — Encounter (HOSPITAL_COMMUNITY): Payer: Self-pay | Admitting: Hematology

## 2022-08-08 ENCOUNTER — Other Ambulatory Visit: Payer: Self-pay | Admitting: *Deleted

## 2022-08-08 NOTE — Telephone Encounter (Signed)
Anastrozole refill approved.  Patient tolerating and is to continue therapy.
